# Patient Record
Sex: Female | Born: 1935 | Race: White | Hispanic: No | Smoking: Never smoker
Health system: Southern US, Community
[De-identification: ages and names within clinical notes are randomized; demographics above are authoritative.]

## PROBLEM LIST (undated history)

## (undated) DIAGNOSIS — E079 Disorder of thyroid, unspecified: Secondary | ICD-10-CM

## (undated) DIAGNOSIS — R238 Other skin changes: Secondary | ICD-10-CM

## (undated) DIAGNOSIS — Z9889 Other specified postprocedural states: Secondary | ICD-10-CM

## (undated) DIAGNOSIS — E785 Hyperlipidemia, unspecified: Secondary | ICD-10-CM

## (undated) DIAGNOSIS — C801 Malignant (primary) neoplasm, unspecified: Secondary | ICD-10-CM

## (undated) DIAGNOSIS — I10 Essential (primary) hypertension: Secondary | ICD-10-CM

## (undated) DIAGNOSIS — R112 Nausea with vomiting, unspecified: Secondary | ICD-10-CM

## (undated) DIAGNOSIS — R233 Spontaneous ecchymoses: Secondary | ICD-10-CM

## (undated) HISTORY — PX: FRACTURE SURGERY: SHX138

## (undated) HISTORY — DX: Malignant (primary) neoplasm, unspecified: C80.1

## (undated) HISTORY — DX: Disorder of thyroid, unspecified: E07.9

## (undated) HISTORY — DX: Essential (primary) hypertension: I10

## (undated) HISTORY — DX: Hyperlipidemia, unspecified: E78.5

## (undated) HISTORY — DX: Other skin changes: R23.8

## (undated) HISTORY — PX: APPENDECTOMY: SHX54

## (undated) HISTORY — DX: Spontaneous ecchymoses: R23.3

---

## 2004-02-13 ENCOUNTER — Encounter: Admission: RE | Admit: 2004-02-13 | Discharge: 2004-02-13 | Payer: Self-pay | Admitting: Surgery

## 2005-02-04 ENCOUNTER — Encounter: Admission: RE | Admit: 2005-02-04 | Discharge: 2005-02-04 | Payer: Self-pay | Admitting: Surgery

## 2006-02-07 ENCOUNTER — Encounter: Admission: RE | Admit: 2006-02-07 | Discharge: 2006-02-07 | Payer: Self-pay | Admitting: Surgery

## 2007-02-09 ENCOUNTER — Encounter: Admission: RE | Admit: 2007-02-09 | Discharge: 2007-02-09 | Payer: Self-pay | Admitting: Surgery

## 2008-02-12 ENCOUNTER — Encounter: Admission: RE | Admit: 2008-02-12 | Discharge: 2008-02-12 | Payer: Self-pay | Admitting: Surgery

## 2009-02-13 ENCOUNTER — Encounter: Admission: RE | Admit: 2009-02-13 | Discharge: 2009-02-13 | Payer: Self-pay | Admitting: Family Medicine

## 2009-02-21 ENCOUNTER — Encounter: Admission: RE | Admit: 2009-02-21 | Discharge: 2009-02-21 | Payer: Self-pay | Admitting: Family Medicine

## 2009-02-28 ENCOUNTER — Encounter: Admission: RE | Admit: 2009-02-28 | Discharge: 2009-02-28 | Payer: Self-pay | Admitting: Family Medicine

## 2009-03-07 ENCOUNTER — Encounter: Admission: RE | Admit: 2009-03-07 | Discharge: 2009-03-07 | Payer: Self-pay | Admitting: Family Medicine

## 2009-04-14 ENCOUNTER — Encounter: Admission: RE | Admit: 2009-04-14 | Discharge: 2009-04-14 | Payer: Self-pay | Admitting: Surgery

## 2009-04-16 ENCOUNTER — Encounter: Admission: RE | Admit: 2009-04-16 | Discharge: 2009-04-16 | Payer: Self-pay | Admitting: Surgery

## 2009-04-16 ENCOUNTER — Ambulatory Visit (HOSPITAL_BASED_OUTPATIENT_CLINIC_OR_DEPARTMENT_OTHER): Admission: RE | Admit: 2009-04-16 | Discharge: 2009-04-16 | Payer: Self-pay | Admitting: Surgery

## 2009-04-16 HISTORY — PX: BREAST SURGERY: SHX581

## 2009-04-22 ENCOUNTER — Ambulatory Visit: Payer: Self-pay | Admitting: Oncology

## 2009-04-28 LAB — CBC WITH DIFFERENTIAL/PLATELET
BASO%: 0.6 % (ref 0.0–2.0)
EOS%: 1.5 % (ref 0.0–7.0)
LYMPH%: 34 % (ref 14.0–49.7)
MCH: 30.1 pg (ref 25.1–34.0)
MCHC: 33.8 g/dL (ref 31.5–36.0)
MONO#: 0.5 10*3/uL (ref 0.1–0.9)
NEUT%: 56.7 % (ref 38.4–76.8)
Platelets: 218 10*3/uL (ref 145–400)
RBC: 4.63 10*6/uL (ref 3.70–5.45)
WBC: 6.7 10*3/uL (ref 3.9–10.3)
lymph#: 2.3 10*3/uL (ref 0.9–3.3)

## 2009-04-28 LAB — COMPREHENSIVE METABOLIC PANEL
ALT: 12 U/L (ref 0–35)
AST: 13 U/L (ref 0–37)
Alkaline Phosphatase: 105 U/L (ref 39–117)
CO2: 26 mEq/L (ref 19–32)
Creatinine, Ser: 0.82 mg/dL (ref 0.40–1.20)
Sodium: 140 mEq/L (ref 135–145)
Total Bilirubin: 0.4 mg/dL (ref 0.3–1.2)
Total Protein: 7.1 g/dL (ref 6.0–8.3)

## 2009-04-30 ENCOUNTER — Ambulatory Visit: Admission: RE | Admit: 2009-04-30 | Discharge: 2009-05-02 | Payer: Self-pay | Admitting: Radiation Oncology

## 2009-05-13 ENCOUNTER — Ambulatory Visit: Admission: RE | Admit: 2009-05-13 | Discharge: 2009-07-18 | Payer: Self-pay | Admitting: Radiation Oncology

## 2009-05-26 ENCOUNTER — Ambulatory Visit: Payer: Self-pay | Admitting: Oncology

## 2009-05-26 LAB — CBC WITH DIFFERENTIAL/PLATELET
BASO%: 0.5 % (ref 0.0–2.0)
Basophils Absolute: 0 10*3/uL (ref 0.0–0.1)
Eosinophils Absolute: 0.1 10*3/uL (ref 0.0–0.5)
HCT: 43.6 % (ref 34.8–46.6)
HGB: 15 g/dL (ref 11.6–15.9)
LYMPH%: 37.5 % (ref 14.0–49.7)
MONO#: 0.5 10*3/uL (ref 0.1–0.9)
NEUT%: 51.1 % (ref 38.4–76.8)
Platelets: 204 10*3/uL (ref 145–400)
WBC: 5.8 10*3/uL (ref 3.9–10.3)
lymph#: 2.2 10*3/uL (ref 0.9–3.3)

## 2009-06-30 ENCOUNTER — Ambulatory Visit: Payer: Self-pay | Admitting: Oncology

## 2009-06-30 LAB — CBC WITH DIFFERENTIAL/PLATELET
BASO%: 0.6 % (ref 0.0–2.0)
Basophils Absolute: 0 10*3/uL (ref 0.0–0.1)
EOS%: 2.5 % (ref 0.0–7.0)
HCT: 39.6 % (ref 34.8–46.6)
HGB: 13.7 g/dL (ref 11.6–15.9)
LYMPH%: 29.4 % (ref 14.0–49.7)
MCH: 30.1 pg (ref 25.1–34.0)
MCHC: 34.7 g/dL (ref 31.5–36.0)
NEUT%: 55.7 % (ref 38.4–76.8)
Platelets: 196 10*3/uL (ref 145–400)
lymph#: 1.2 10*3/uL (ref 0.9–3.3)

## 2009-06-30 LAB — BASIC METABOLIC PANEL
BUN: 13 mg/dL (ref 6–23)
CO2: 28 mEq/L (ref 19–32)
Calcium: 9.8 mg/dL (ref 8.4–10.5)
Chloride: 100 mEq/L (ref 96–112)
Creatinine, Ser: 0.71 mg/dL (ref 0.40–1.20)

## 2009-07-14 LAB — COMPREHENSIVE METABOLIC PANEL
ALT: 16 U/L (ref 0–35)
AST: 15 U/L (ref 0–37)
Alkaline Phosphatase: 79 U/L (ref 39–117)
BUN: 17 mg/dL (ref 6–23)
Calcium: 9.5 mg/dL (ref 8.4–10.5)
Creatinine, Ser: 0.74 mg/dL (ref 0.40–1.20)
Total Bilirubin: 0.7 mg/dL (ref 0.3–1.2)

## 2009-07-14 LAB — CBC WITH DIFFERENTIAL/PLATELET
BASO%: 0.6 % (ref 0.0–2.0)
Basophils Absolute: 0 10*3/uL (ref 0.0–0.1)
EOS%: 3.4 % (ref 0.0–7.0)
HCT: 38.1 % (ref 34.8–46.6)
HGB: 13.1 g/dL (ref 11.6–15.9)
LYMPH%: 22.1 % (ref 14.0–49.7)
MCH: 30 pg (ref 25.1–34.0)
MCHC: 34.3 g/dL (ref 31.5–36.0)
MCV: 87.4 fL (ref 79.5–101.0)
MONO%: 12.6 % (ref 0.0–14.0)
NEUT%: 61.3 % (ref 38.4–76.8)
Platelets: 175 10*3/uL (ref 145–400)
lymph#: 0.8 10*3/uL — ABNORMAL LOW (ref 0.9–3.3)

## 2009-08-22 ENCOUNTER — Ambulatory Visit: Payer: Self-pay | Admitting: Oncology

## 2009-08-25 LAB — CBC WITH DIFFERENTIAL/PLATELET
BASO%: 0.5 % (ref 0.0–2.0)
Basophils Absolute: 0 10*3/uL (ref 0.0–0.1)
HCT: 40.6 % (ref 34.8–46.6)
HGB: 13.9 g/dL (ref 11.6–15.9)
LYMPH%: 24.4 % (ref 14.0–49.7)
MCHC: 34.3 g/dL (ref 31.5–36.0)
MONO#: 0.5 10*3/uL (ref 0.1–0.9)
NEUT%: 62.5 % (ref 38.4–76.8)
Platelets: 182 10*3/uL (ref 145–400)
WBC: 4.5 10*3/uL (ref 3.9–10.3)

## 2010-02-16 ENCOUNTER — Encounter: Admission: RE | Admit: 2010-02-16 | Discharge: 2010-02-16 | Payer: Self-pay | Admitting: Obstetrics and Gynecology

## 2010-02-18 ENCOUNTER — Encounter: Admission: RE | Admit: 2010-02-18 | Discharge: 2010-02-18 | Payer: Self-pay | Admitting: Family Medicine

## 2010-02-24 ENCOUNTER — Encounter: Admission: RE | Admit: 2010-02-24 | Discharge: 2010-02-24 | Payer: Self-pay | Admitting: Family Medicine

## 2010-02-26 ENCOUNTER — Ambulatory Visit: Payer: Self-pay | Admitting: Oncology

## 2010-03-02 LAB — CBC WITH DIFFERENTIAL/PLATELET
BASO%: 0.5 % (ref 0.0–2.0)
Eosinophils Absolute: 0.1 10*3/uL (ref 0.0–0.5)
MONO#: 0.4 10*3/uL (ref 0.1–0.9)
NEUT#: 2.4 10*3/uL (ref 1.5–6.5)
RBC: 4.6 10*6/uL (ref 3.70–5.45)
RDW: 12.8 % (ref 11.2–14.5)
WBC: 3.9 10*3/uL (ref 3.9–10.3)

## 2010-03-02 LAB — BASIC METABOLIC PANEL
CO2: 29 mEq/L (ref 19–32)
Glucose, Bld: 84 mg/dL (ref 70–99)
Potassium: 3.8 mEq/L (ref 3.5–5.3)
Sodium: 140 mEq/L (ref 135–145)

## 2010-03-03 HISTORY — PX: MASTECTOMY: SHX3

## 2010-03-17 ENCOUNTER — Ambulatory Visit (HOSPITAL_COMMUNITY): Admission: RE | Admit: 2010-03-17 | Discharge: 2010-03-18 | Payer: Self-pay | Admitting: Surgery

## 2010-03-17 ENCOUNTER — Encounter (INDEPENDENT_AMBULATORY_CARE_PROVIDER_SITE_OTHER): Payer: Self-pay | Admitting: Surgery

## 2010-05-11 ENCOUNTER — Ambulatory Visit: Payer: Self-pay | Admitting: Oncology

## 2010-05-13 LAB — CBC WITH DIFFERENTIAL/PLATELET
BASO%: 0.4 % (ref 0.0–2.0)
Basophils Absolute: 0 10*3/uL (ref 0.0–0.1)
EOS%: 2.1 % (ref 0.0–7.0)
Eosinophils Absolute: 0.1 10*3/uL (ref 0.0–0.5)
HCT: 42.7 % (ref 34.8–46.6)
HGB: 14.5 g/dL (ref 11.6–15.9)
LYMPH%: 31.4 % (ref 14.0–49.7)
MCH: 29.9 pg (ref 25.1–34.0)
MCHC: 33.9 g/dL (ref 31.5–36.0)
MCV: 88.2 fL (ref 79.5–101.0)
MONO#: 0.5 10*3/uL (ref 0.1–0.9)
MONO%: 10.5 % (ref 0.0–14.0)
NEUT#: 2.6 10*3/uL (ref 1.5–6.5)
NEUT%: 55.6 % (ref 38.4–76.8)
Platelets: 225 10*3/uL (ref 145–400)
RBC: 4.84 10*6/uL (ref 3.70–5.45)
RDW: 13.4 % (ref 11.2–14.5)
WBC: 4.7 10*3/uL (ref 3.9–10.3)
lymph#: 1.5 10*3/uL (ref 0.9–3.3)

## 2010-05-13 LAB — BASIC METABOLIC PANEL
BUN: 13 mg/dL (ref 6–23)
CO2: 31 mEq/L (ref 19–32)
Calcium: 10.2 mg/dL (ref 8.4–10.5)
Chloride: 100 mEq/L (ref 96–112)
Creatinine, Ser: 0.77 mg/dL (ref 0.40–1.20)
Glucose, Bld: 80 mg/dL (ref 70–99)
Potassium: 4.2 mEq/L (ref 3.5–5.3)
Sodium: 139 mEq/L (ref 135–145)

## 2010-05-24 ENCOUNTER — Encounter: Payer: Self-pay | Admitting: Family Medicine

## 2010-07-14 LAB — DIFFERENTIAL
Eosinophils Relative: 3 % (ref 0–5)
Lymphocytes Relative: 30 % (ref 12–46)
Lymphs Abs: 1.1 10*3/uL (ref 0.7–4.0)
Monocytes Absolute: 0.4 10*3/uL (ref 0.1–1.0)
Monocytes Relative: 9 % (ref 3–12)

## 2010-07-14 LAB — CBC
MCH: 28.4 pg (ref 26.0–34.0)
MCHC: 33.3 g/dL (ref 30.0–36.0)
Platelets: 193 10*3/uL (ref 150–400)
RBC: 4.57 MIL/uL (ref 3.87–5.11)

## 2010-07-14 LAB — COMPREHENSIVE METABOLIC PANEL
ALT: 14 U/L (ref 0–35)
AST: 16 U/L (ref 0–37)
Albumin: 3.8 g/dL (ref 3.5–5.2)
Calcium: 9.6 mg/dL (ref 8.4–10.5)
Creatinine, Ser: 0.7 mg/dL (ref 0.4–1.2)
GFR calc Af Amer: >60 mL/min (ref >60)
Sodium: 138 mEq/L (ref 135–145)
Total Protein: 7.1 g/dL (ref 6.0–8.3)

## 2010-07-14 LAB — SURGICAL PCR SCREEN: Staphylococcus aureus: NEGATIVE

## 2010-08-04 LAB — CBC
HCT: 40.5 % (ref 36.0–46.0)
Hemoglobin: 13.9 g/dL (ref 12.0–15.0)
MCV: 88.6 fL (ref 78.0–100.0)
RBC: 4.57 MIL/uL (ref 3.87–5.11)
WBC: 4.7 10*3/uL (ref 4.0–10.5)

## 2010-08-04 LAB — COMPREHENSIVE METABOLIC PANEL
AST: 16 U/L (ref 0–37)
CO2: 30 mEq/L (ref 19–32)
Chloride: 103 mEq/L (ref 96–112)
Creatinine, Ser: 0.72 mg/dL (ref 0.4–1.2)
GFR calc Af Amer: >60 mL/min (ref >60)
GFR calc non Af Amer: >60 mL/min (ref >60)
Glucose, Bld: 86 mg/dL (ref 70–99)
Total Bilirubin: 0.8 mg/dL (ref 0.3–1.2)

## 2010-08-04 LAB — DIFFERENTIAL
Basophils Relative: 0 % (ref 0–1)
Eosinophils Absolute: 0.1 10*3/uL (ref 0.0–0.7)
Eosinophils Relative: 2 % (ref 0–5)
Neutrophils Relative %: 48 % (ref 43–77)

## 2010-08-04 LAB — LACTATE DEHYDROGENASE: LDH: 147 U/L (ref 94–250)

## 2010-09-30 ENCOUNTER — Encounter (INDEPENDENT_AMBULATORY_CARE_PROVIDER_SITE_OTHER): Payer: Self-pay | Admitting: Surgery

## 2011-01-18 ENCOUNTER — Other Ambulatory Visit (INDEPENDENT_AMBULATORY_CARE_PROVIDER_SITE_OTHER): Payer: Self-pay | Admitting: Surgery

## 2011-01-18 DIAGNOSIS — Z853 Personal history of malignant neoplasm of breast: Secondary | ICD-10-CM

## 2011-02-23 ENCOUNTER — Other Ambulatory Visit (INDEPENDENT_AMBULATORY_CARE_PROVIDER_SITE_OTHER): Payer: Self-pay | Admitting: Surgery

## 2011-02-23 ENCOUNTER — Ambulatory Visit
Admission: RE | Admit: 2011-02-23 | Discharge: 2011-02-23 | Disposition: A | Discharge status: Home / Self Care | Payer: Medicare Other | Source: Ambulatory Visit | Attending: Surgery | Admitting: Surgery

## 2011-02-23 DIAGNOSIS — Z853 Personal history of malignant neoplasm of breast: Secondary | ICD-10-CM

## 2011-02-24 ENCOUNTER — Ambulatory Visit
Admission: RE | Admit: 2011-02-24 | Discharge: 2011-02-24 | Disposition: A | Discharge status: Home / Self Care | Payer: Medicare Other | Source: Ambulatory Visit | Attending: Surgery | Admitting: Surgery

## 2011-02-24 ENCOUNTER — Other Ambulatory Visit (INDEPENDENT_AMBULATORY_CARE_PROVIDER_SITE_OTHER): Payer: Self-pay | Admitting: Surgery

## 2011-02-24 DIAGNOSIS — Z853 Personal history of malignant neoplasm of breast: Secondary | ICD-10-CM

## 2011-02-24 DIAGNOSIS — C50912 Malignant neoplasm of unspecified site of left female breast: Secondary | ICD-10-CM

## 2011-03-01 ENCOUNTER — Other Ambulatory Visit: Payer: Medicare Other

## 2011-03-03 ENCOUNTER — Encounter (INDEPENDENT_AMBULATORY_CARE_PROVIDER_SITE_OTHER): Payer: Self-pay

## 2011-03-04 ENCOUNTER — Ambulatory Visit (INDEPENDENT_AMBULATORY_CARE_PROVIDER_SITE_OTHER): Payer: Medicare Other | Admitting: Surgery

## 2011-03-04 ENCOUNTER — Encounter (INDEPENDENT_AMBULATORY_CARE_PROVIDER_SITE_OTHER): Payer: Self-pay | Admitting: Surgery

## 2011-03-04 DIAGNOSIS — C50919 Malignant neoplasm of unspecified site of unspecified female breast: Secondary | ICD-10-CM | POA: Insufficient documentation

## 2011-03-04 DIAGNOSIS — C50911 Malignant neoplasm of unspecified site of right female breast: Secondary | ICD-10-CM | POA: Insufficient documentation

## 2011-03-04 NOTE — Progress Notes (Addendum)
ASSESSMENT AND PLAN: 1.  Left breast cancer, IDC.  ER-99%/PR-100%, Ki67 - 13%, Her2Neu - neg.  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  Dr. Welton Flakes and [facility] treating physicians.  I discussed the options for breast cancer treatment with the patient.  She has been through this before and understands the plan and surgery.  I discussed reconstruction which she does not want to do.  Since she has had a prior lumpectomy and radiation, she needs a mastectomy this time.  The risks of surgery include, but are not limited to, bleeding, infection, the need for further surgery, and nerve injury.  The patient has been given literature on the treatment of breast cancer.  She is accompanied with her husband.  2.  Right breast cancer, DCIS.  Mastectomy 03/17/2010.  Disease free.  3.  Hypertension. 4.  Hypercholesterolemia. 5.  Thyroid replacement. 6.  She tried anti-estrogen tx before, but did not tolerate it.  Chief Complaint  Patient presents with  . Other    Eval of left breast cancer   REFERRING PHYSICIAN: LITTLE,[name], MD  HISTORY OF PRESENT ILLNESS: Claudia Stein is a 77 y.o. (DOB: July 28, 1933)  whtie female whose primary care physician is LITTLE,[name], MD and comes to me today for left breast cancer.  The patient is well known to me for bilateral breast cancer, DCIS. She had a recent mammogram that showed changes in her left breast. She had retroareolar calcifications which were suspicious. A biopsy on 23 February 2011 showed invasive ductal carcinoma.  The patient herself had noticed a change in her breast or had any problems with her breasts.  She is accompanied with her husband today in our office.   Had left breast lumpectomy for DCIS 04/11/2009.  Had right mastectomy for DCIS 03/17/2010.  Presented to Breast Ca Conf 03/03/2011.  Now has IDC with LVI of left breast  ER/PR pos., Ki67 - 13%, Her2Neu - neg.   Will need a left mastectomy    Past Medical History  Diagnosis Date  .  Cancer 04-16-09, 2012    Breast  . Hyperlipidemia   . Hypertension   . Thyroid disease   . Bruises easily     Past Surgical History  Procedure Date  . Breast surgery 04-16-09    left  Lumpectomy  . Mastectomy November 2011    right breast    Current Outpatient Prescriptions  Medication Sig Dispense Refill  . levothyroxine (SYNTHROID, LEVOTHROID) 50 MCG tablet Take 50 mcg by mouth daily.        . NON FORMULARY 2 (two) times daily. Takes Preservision for eyes       . Omega-3 Fatty Acids (FISH OIL) 1000 MG CAPS Take by mouth daily.        . Calcium Carbonate-Vitamin D (CALCIUM PLUS VITAMIN D PO) Take 1 tablet by mouth daily.        . hydrochlorothiazide 25 MG tablet Take 25 mg by mouth daily.        Marland Kitchen losartan (COZAAR) 50 MG tablet Take 25 mg by mouth daily.        . Multiple Vitamins-Minerals (PRESERVISION AREDS 2 PO) Take 2 tablets by mouth daily.        . simvastatin (ZOCOR) 20 MG tablet Take 20 mg by mouth daily.        . vitamin B-12 (CYANOCOBALAMIN) 1000 MCG tablet Take 1,000 mcg by mouth daily.        . vitamin C (ASCORBIC ACID) 500 MG tablet Take 500 mg  by mouth daily.          No Known Allergies  REVIEW OF SYSTEMS: Skin:  No history of rash.  No history of abnormal moles. Infection:  No history of hepatitis or HIV.  No history of MRSA. Neurologic:  No history of stroke.  No history of seizure.  No history of headaches. Cardiac:  Hypertension x 6 years. No history of heart disease.  No history of prior cardiac catheterization.  No history of seeing a cardiologist. Pulmonary:  Does not smoke cigarettes.  No asthma or bronchitis.  No OSA/CPAP.  Endocrine:  No diabetes.  On thyroid replacement.  Hypercholesterolemia. Gastrointestinal:  No history of stomach disease.  No history of liver disease.  No history of gall bladder disease.  No history of pancreas disease.  No history of colon disease. Trouble with nausea with last anesthesia. Urologic:  No history of kidney stones.   No history of bladder infections. Musculoskeletal:  No history of joint or back disease. Hematologic:  No bleeding disorder.  No history of anemia.  Not anticoagulated. Psycho-social:  The patient is oriented.   The patient has no obvious psychologic or social impairment to understanding our conversation and plan.  SOCIAL and FAMILY HISTORY: Accompanied by husband.  PHYSICAL EXAM: BP 142/90  Pulse 78  Temp(Src) 97.1 F (36.2 C) (Temporal)  Resp 16  Ht 5\' 2"  (1.575 m)  Wt 125 lb (56.7 kg)  BMI 22.86 kg/m2  General: WN WF, who is alert and generally healthy appearing.  HEENT: Normal. Pupils equal. Good dentition. Neck: Supple. No mass.  No thyroid mass.  Carotid pulse okay with no bruit. Lymph Nodes:  No supraclavicular, cervical nodes, or axillary mass. Lungs: Clear to auscultation and symmetric breath sounds. Heart:  RRR. No murmur or rub. Breasts:  Absent right breast.  Left breast with deformity below nipple from prior surgery.  No axillary mass. Abdomen: Soft. No mass. No tenderness. No hernia. Normal bowel sounds.  No abdominal scars. Rectal: Not done. Extremities:  Good strength and ROM  in upper and lower extremities. Neurologic:  Grossly intact to motor and sensory function. Psychiatric: Has normal mood and affect. Behavior is normal.   DATA REVIEWED: Mammogram, path report (to patient).  Ovidio Kin, M.D., [facility], [place] [phone]  UPDATE  Date of surgery:  ________________________________   Planned surgery: _________________________________________________________  The H & P was reviewed, the patient examined, and no change has occurred in the patient's condition or plan of care since this H & P was completed.  ____________________________        Date:  _________________   Time:  _________ Ovidio Kin,  MD, FACS

## 2011-03-05 ENCOUNTER — Other Ambulatory Visit (INDEPENDENT_AMBULATORY_CARE_PROVIDER_SITE_OTHER): Payer: Self-pay | Admitting: Surgery

## 2011-03-05 DIAGNOSIS — C50912 Malignant neoplasm of unspecified site of left female breast: Secondary | ICD-10-CM

## 2011-03-07 MED ORDER — DEXTROSE 5 % IV SOLN: 1.0000 g | Freq: Once | INTRAVENOUS | Status: DC

## 2011-03-12 ENCOUNTER — Encounter (HOSPITAL_COMMUNITY): Payer: Self-pay | Admitting: Pharmacy Technician

## 2011-03-16 ENCOUNTER — Other Ambulatory Visit: Payer: Self-pay

## 2011-03-16 ENCOUNTER — Encounter (HOSPITAL_COMMUNITY)
Admission: RE | Admit: 2011-03-16 | Discharge: 2011-03-16 | Disposition: A | Discharge status: Home / Self Care | Payer: Medicare Other | Source: Ambulatory Visit | Attending: Surgery | Admitting: Surgery

## 2011-03-16 ENCOUNTER — Encounter (HOSPITAL_COMMUNITY): Payer: Self-pay

## 2011-03-16 HISTORY — DX: Other specified postprocedural states: Z98.890

## 2011-03-16 HISTORY — DX: Other specified postprocedural states: R11.2

## 2011-03-16 LAB — CBC
Hemoglobin: 14.6 g/dL (ref 12.0–15.0)
MCH: 29.5 pg (ref 26.0–34.0)
MCHC: 33.9 g/dL (ref 30.0–36.0)
Platelets: 198 10*3/uL (ref 150–400)
RDW: 12.8 % (ref 11.5–15.5)

## 2011-03-16 LAB — COMPREHENSIVE METABOLIC PANEL
ALT: 14 U/L (ref 0–35)
AST: 17 U/L (ref 0–37)
Albumin: 3.7 g/dL (ref 3.5–5.2)
Alkaline Phosphatase: 120 U/L — ABNORMAL HIGH (ref 39–117)
Calcium: 10.7 mg/dL — ABNORMAL HIGH (ref 8.4–10.5)
GFR calc Af Amer: >90 mL/min (ref >90)
Glucose, Bld: 84 mg/dL (ref 70–99)
Potassium: 3.9 mEq/L (ref 3.5–5.1)
Sodium: 140 mEq/L (ref 135–145)
Total Protein: 7.8 g/dL (ref 6.0–8.3)

## 2011-03-16 LAB — DIFFERENTIAL
Eosinophils Absolute: 0.1 10*3/uL (ref 0.0–0.7)
Eosinophils Relative: 1 % (ref 0–5)
Lymphocytes Relative: 38 % (ref 12–46)
Lymphs Abs: 1.7 10*3/uL (ref 0.7–4.0)
Monocytes Relative: 11 % (ref 3–12)

## 2011-03-16 LAB — SURGICAL PCR SCREEN: MRSA, PCR: NEGATIVE

## 2011-03-16 NOTE — Pre-Procedure Instructions (Signed)
20 Claudia Stein  03/16/2011   Your procedure is scheduled on: Monday, November 19th  Report to [facility] at 8:30AM.  Call this number if you have problems the morning of surgery: [phone]   Remember:   Do not eat food:After Midnight.  Do not drink clear liquids: 4 Hours before arrival.  Take these medicines the morning of surgery with A SIP OF WATER: Losartan &              synthroid  Do not wear jewelry, make-up or nail polish.  Do not wear lotions, powders, or perfumes. You may wear deodorant.  Do not shave 48 hours prior to surgery.  Do not bring valuables to the hospital.  Contacts, dentures or bridgework may not be worn into surgery.  Leave suitcase in the car. After surgery it may be brought to your room.  For patients admitted to the hospital, checkout time is 11:00 AM the day of discharge.   Patients discharged the day of surgery will not be allowed to drive home.  Name and phone number of your driver: Leonette Most --spouse                                                     Special Instructions: CHG Shower Use Special Wash: 1/2 bottle night before surgery and 1/2 bottle morning of surgery.   Please read over the following fact sheets that you were given: Pain Booklet, MRSA Information and Surgical Site Infection Prevention

## 2011-03-16 NOTE — Pre-Procedure Instructions (Signed)
Claudia Stein  03/16/2011   Your procedure is scheduled on: Monday, November 19th  Report to [facility] at 8:30AM.  Call this number if you have problems the morning of surgery: [phone]   Remember:   Do not eat food:After Midnight .  Do not drink clear liquids: 4 Hours before arrival 4:30 am.  Take these medicines the morning of surgery with A SIP OF WATER: Losartan & Synthroid   Do not wear jewelry, make-up or nail polish.   Do not wear lotions, powders, or perfumes. You may wear deodorant.  Do not shave 48 hours prior to surgery.   Do not bring valuables to the hospital.   Contacts, dentures or bridgework may not be worn into surgery.  Leave suitcase in the car. After surgery it may be brought to your room.  For patients admitted to the hospital, checkout time is 11:00 AM the day of discharge.   Patients discharged the day of surgery will not be allowed to drive home.  Name and phone number of your driver:  Yashica Sterbenz  -- spouse [phone]    Special Instructions: CHG Shower Use Special Wash: 1/2 bottle night before surgery and 1/2 bottle morning of surgery.   Please read over the following fact sheets that you were given:  Pain Booklet, MRSA Information and Surgical Site Infection Prevention

## 2011-03-17 ENCOUNTER — Ambulatory Visit (INDEPENDENT_AMBULATORY_CARE_PROVIDER_SITE_OTHER): Payer: Self-pay | Admitting: Surgery

## 2011-03-18 NOTE — Consult Note (Signed)
Anesthesia:  77 year old female for left mastectomy for breast CA.  She underwent a right mastectomy in November 2011.  Hx + for hyperlipidemia, HTN, hypothyroidism.  I was asked to review her preoperative EKG which showed left BBB.  I actually reviewed her chart and a prior EKG also showing a left BBB in November.  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  She has tolerated at least two prior breast procedures.  If no new symptoms, then plan to proceed.  Labs and CXR also reviewed.

## 2011-03-18 NOTE — Progress Notes (Signed)
ALLISON PLEASE REVIEW EKG.

## 2011-03-20 NOTE — Progress Notes (Signed)
Quick Note:  These labs are OK for surgery. ______ 

## 2011-03-21 MED ORDER — CEFAZOLIN SODIUM 1-5 GM-% IV SOLN
1.0000 g | INTRAVENOUS | Status: AC
  Administered 2011-03-22: 1 g via INTRAVENOUS
  Filled 2011-03-21: qty 50

## 2011-03-22 ENCOUNTER — Other Ambulatory Visit (INDEPENDENT_AMBULATORY_CARE_PROVIDER_SITE_OTHER): Payer: Self-pay | Admitting: Surgery

## 2011-03-22 ENCOUNTER — Encounter (HOSPITAL_COMMUNITY)
Admission: AD | Disposition: A | Discharge status: Home / Self Care | Payer: Self-pay | Source: Ambulatory Visit | Attending: Surgery

## 2011-03-22 ENCOUNTER — Encounter (HOSPITAL_COMMUNITY): Payer: Self-pay | Admitting: Vascular Surgery

## 2011-03-22 ENCOUNTER — Encounter (HOSPITAL_COMMUNITY): Payer: Self-pay | Admitting: *Deleted

## 2011-03-22 ENCOUNTER — Ambulatory Visit (HOSPITAL_COMMUNITY): Payer: Medicare Other | Admitting: Vascular Surgery

## 2011-03-22 ENCOUNTER — Inpatient Hospital Stay (HOSPITAL_COMMUNITY)
Admission: AD | Admit: 2011-03-22 | Discharge: 2011-03-23 | DRG: 581 | Disposition: A | Discharge status: Home / Self Care | Payer: Medicare Other | Source: Ambulatory Visit | Attending: Surgery | Admitting: Surgery

## 2011-03-22 ENCOUNTER — Ambulatory Visit (HOSPITAL_COMMUNITY): Payer: Medicare Other

## 2011-03-22 ENCOUNTER — Ambulatory Visit (HOSPITAL_COMMUNITY)
Admission: RE | Admit: 2011-03-22 | Discharge: 2011-03-22 | Disposition: A | Discharge status: Home / Self Care | Payer: Medicare Other | Source: Ambulatory Visit | Attending: Surgery | Admitting: Surgery

## 2011-03-22 DIAGNOSIS — C50919 Malignant neoplasm of unspecified site of unspecified female breast: Secondary | ICD-10-CM

## 2011-03-22 DIAGNOSIS — Z01812 Encounter for preprocedural laboratory examination: Secondary | ICD-10-CM

## 2011-03-22 DIAGNOSIS — I10 Essential (primary) hypertension: Secondary | ICD-10-CM | POA: Diagnosis present

## 2011-03-22 DIAGNOSIS — C50912 Malignant neoplasm of unspecified site of left female breast: Secondary | ICD-10-CM

## 2011-03-22 DIAGNOSIS — E7800 Pure hypercholesterolemia, unspecified: Secondary | ICD-10-CM | POA: Diagnosis present

## 2011-03-22 DIAGNOSIS — Z853 Personal history of malignant neoplasm of breast: Secondary | ICD-10-CM

## 2011-03-22 HISTORY — PX: MASTECTOMY W/ SENTINEL NODE BIOPSY: SHX2001

## 2011-03-22 SURGERY — MASTECTOMY WITH SENTINEL LYMPH NODE BIOPSY
Anesthesia: General | Laterality: Left | Wound class: Clean

## 2011-03-22 MED ORDER — FENTANYL CITRATE 0.05 MG/ML IJ SOLN
INTRAMUSCULAR | Status: DC | PRN
  Administered 2011-03-22: 25 ug via INTRAVENOUS
  Administered 2011-03-22: 50 ug via INTRAVENOUS
  Administered 2011-03-22: 100 ug via INTRAVENOUS

## 2011-03-22 MED ORDER — FENTANYL CITRATE 0.05 MG/ML IJ SOLN
50.0000 ug | INTRAMUSCULAR | Status: DC | PRN
  Administered 2011-03-22: 100 ug via INTRAVENOUS

## 2011-03-22 MED ORDER — TECHNETIUM TC 99M SULFUR COLLOID FILTERED
1.0000 | Freq: Once | INTRAVENOUS | Status: AC | PRN
  Administered 2011-03-22: 1 via INTRADERMAL

## 2011-03-22 MED ORDER — LEVOTHYROXINE SODIUM 50 MCG PO TABS
50.0000 ug | ORAL_TABLET | Freq: Every day | ORAL | Status: DC
  Administered 2011-03-23: 50 ug via ORAL
  Filled 2011-03-22 (×2): qty 1

## 2011-03-22 MED ORDER — LACTATED RINGERS IV SOLN
INTRAVENOUS | Status: DC
  Administered 2011-03-22: 10:00:00 via INTRAVENOUS

## 2011-03-22 MED ORDER — ONDANSETRON HCL 4 MG PO TABS: 4.0000 mg | ORAL_TABLET | Freq: Four times a day (QID) | ORAL | Status: DC | PRN

## 2011-03-22 MED ORDER — MORPHINE SULFATE 2 MG/ML IJ SOLN: 1.0000 mg | INTRAMUSCULAR | Status: DC | PRN

## 2011-03-22 MED ORDER — PROPOFOL 10 MG/ML IV EMUL
INTRAVENOUS | Status: DC | PRN
  Administered 2011-03-22: 150 mg via INTRAVENOUS

## 2011-03-22 MED ORDER — KCL IN DEXTROSE-NACL 20-5-0.45 MEQ/L-%-% IV SOLN
INTRAVENOUS | Status: DC
  Administered 2011-03-22 – 2011-03-23 (×2): via INTRAVENOUS
  Filled 2011-03-22 (×3): qty 1000

## 2011-03-22 MED ORDER — TECHNETIUM TC 99M SULFUR COLLOID FILTERED: 1.0000 | Freq: Once | INTRAVENOUS | Status: AC | PRN

## 2011-03-22 MED ORDER — HYDROCHLOROTHIAZIDE 25 MG PO TABS
25.0000 mg | ORAL_TABLET | Freq: Every day | ORAL | Status: DC
  Administered 2011-03-23: 25 mg via ORAL
  Filled 2011-03-22 (×2): qty 1

## 2011-03-22 MED ORDER — FENTANYL CITRATE 0.05 MG/ML IJ SOLN
INTRAMUSCULAR | Status: AC
  Filled 2011-03-22: qty 2

## 2011-03-22 MED ORDER — GLYCOPYRROLATE 0.2 MG/ML IJ SOLN
INTRAMUSCULAR | Status: DC | PRN
  Administered 2011-03-22: 0.2 mg via INTRAVENOUS

## 2011-03-22 MED ORDER — ONDANSETRON HCL 4 MG/2ML IJ SOLN
INTRAMUSCULAR | Status: DC | PRN
  Administered 2011-03-22: 4 mg via INTRAVENOUS

## 2011-03-22 MED ORDER — MIDAZOLAM HCL 2 MG/2ML IJ SOLN
INTRAMUSCULAR | Status: AC
  Filled 2011-03-22: qty 2

## 2011-03-22 MED ORDER — LOSARTAN POTASSIUM 25 MG PO TABS
25.0000 mg | ORAL_TABLET | Freq: Every day | ORAL | Status: DC
  Filled 2011-03-22 (×2): qty 1

## 2011-03-22 MED ORDER — ONDANSETRON HCL 4 MG/2ML IJ SOLN: 4.0000 mg | Freq: Four times a day (QID) | INTRAMUSCULAR | Status: DC | PRN

## 2011-03-22 MED ORDER — SODIUM CHLORIDE 0.9 % IJ SOLN
INTRAMUSCULAR | Status: DC | PRN
  Administered 2011-03-22: 11:00:00

## 2011-03-22 MED ORDER — HEPARIN SODIUM (PORCINE) 5000 UNIT/ML IJ SOLN
5000.0000 [IU] | Freq: Three times a day (TID) | INTRAMUSCULAR | Status: DC
  Administered 2011-03-22 – 2011-03-23 (×2): 5000 [IU] via SUBCUTANEOUS
  Filled 2011-03-22 (×5): qty 1

## 2011-03-22 MED ORDER — HYDROMORPHONE HCL PF 1 MG/ML IJ SOLN
0.2500 mg | INTRAMUSCULAR | Status: DC | PRN
Start: 2011-03-22 — End: 2011-03-22
  Administered 2011-03-22: 0.25 mg via INTRAVENOUS

## 2011-03-22 MED ORDER — LACTATED RINGERS IV SOLN
INTRAVENOUS | Status: DC | PRN
  Administered 2011-03-22 (×2): via INTRAVENOUS

## 2011-03-22 MED ORDER — EPHEDRINE SULFATE 50 MG/ML IJ SOLN
INTRAMUSCULAR | Status: DC | PRN
  Administered 2011-03-22: 10 mg via INTRAVENOUS

## 2011-03-22 MED ORDER — SCOPOLAMINE 1 MG/3DAYS TD PT72
1.0000 | MEDICATED_PATCH | TRANSDERMAL | Status: DC
  Administered 2011-03-22: 1.5 mg via TRANSDERMAL
  Filled 2011-03-22: qty 1

## 2011-03-22 MED ORDER — HYDROCODONE-ACETAMINOPHEN 5-325 MG PO TABS
1.0000 | ORAL_TABLET | ORAL | Status: DC | PRN
Start: 2011-03-22 — End: 2011-03-23

## 2011-03-22 MED ORDER — SODIUM CHLORIDE 0.9 % IR SOLN
Status: DC | PRN
  Administered 2011-03-22: 1000 mL

## 2011-03-22 MED ORDER — MIDAZOLAM HCL 2 MG/2ML IJ SOLN
1.0000 mg | INTRAMUSCULAR | Status: DC | PRN
  Administered 2011-03-22: 1 mg via INTRAVENOUS

## 2011-03-22 SURGICAL SUPPLY — 52 items
APPLIER CLIP 9.375 MED OPEN (MISCELLANEOUS) ×2
ATCH SMKEVC FLXB CAUT HNDSWH (FILTER) ×1 IMPLANT
BANDAGE ELASTIC 6 VELCRO ST LF (GAUZE/BANDAGES/DRESSINGS) IMPLANT
BINDER BREAST LRG (GAUZE/BANDAGES/DRESSINGS) ×2 IMPLANT
BINDER BREAST MEDIUM (GAUZE/BANDAGES/DRESSINGS) ×2 IMPLANT
BINDER BREAST XLRG (GAUZE/BANDAGES/DRESSINGS) IMPLANT
CANISTER SUCTION 2500CC (MISCELLANEOUS) ×2 IMPLANT
CHLORAPREP W/TINT 26ML (MISCELLANEOUS) ×2 IMPLANT
CLIP APPLIE 9.375 MED OPEN (MISCELLANEOUS) ×1 IMPLANT
CLOTH BEACON ORANGE TIMEOUT ST (SAFETY) ×2 IMPLANT
CONT SPEC 4OZ CLIKSEAL STRL BL (MISCELLANEOUS) ×4 IMPLANT
COVER PROBE W GEL 5X96 (DRAPES) ×4 IMPLANT
COVER SURGICAL LIGHT HANDLE (MISCELLANEOUS) ×2 IMPLANT
DERMABOND ADVANCED (GAUZE/BANDAGES/DRESSINGS) ×1
DERMABOND ADVANCED .7 DNX12 (GAUZE/BANDAGES/DRESSINGS) ×1 IMPLANT
DRAIN CHANNEL 19F RND (DRAIN) ×4 IMPLANT
DRAPE LAPAROSCOPIC ABDOMINAL (DRAPES) ×2 IMPLANT
DRAPE PROXIMA HALF (DRAPES) ×2 IMPLANT
ELECT CAUTERY BLADE 6.4 (BLADE) ×2 IMPLANT
ELECT REM PT RETURN 9FT ADLT (ELECTROSURGICAL) ×2
ELECTRODE REM PT RTRN 9FT ADLT (ELECTROSURGICAL) ×1 IMPLANT
EVACUATOR SILICONE 100CC (DRAIN) ×4 IMPLANT
EVACUATOR SMOKE ACCUVAC VALLEY (FILTER) ×1
GAUZE SPONGE 4X4 12PLY STRL LF (GAUZE/BANDAGES/DRESSINGS) ×2 IMPLANT
GLOVE BIOGEL PI IND STRL 7.5 (GLOVE) ×1 IMPLANT
GLOVE BIOGEL PI INDICATOR 7.5 (GLOVE) ×1
GLOVE ECLIPSE 7.5 STRL STRAW (GLOVE) ×2 IMPLANT
GLOVE SURG SIGNA 7.5 PF LTX (GLOVE) ×2 IMPLANT
GOWN PREVENTION PLUS XLARGE (GOWN DISPOSABLE) ×2 IMPLANT
GOWN STRL NON-REIN LRG LVL3 (GOWN DISPOSABLE) IMPLANT
GOWN STRL REIN XL XLG (GOWN DISPOSABLE) ×2 IMPLANT
KIT BASIN OR (CUSTOM PROCEDURE TRAY) ×2 IMPLANT
KIT ROOM TURNOVER OR (KITS) ×2 IMPLANT
NEEDLE 18GX1X1/2 (RX/OR ONLY) (NEEDLE) ×2 IMPLANT
NEEDLE HYPO 25GX1X1/2 BEV (NEEDLE) ×2 IMPLANT
NS IRRIG 1000ML POUR BTL (IV SOLUTION) ×2 IMPLANT
PACK GENERAL/GYN (CUSTOM PROCEDURE TRAY) ×2 IMPLANT
PAD ARMBOARD 7.5X6 YLW CONV (MISCELLANEOUS) ×2 IMPLANT
PEN SKIN MARKING BROAD (MISCELLANEOUS) ×2 IMPLANT
SPECIMEN JAR LARGE (MISCELLANEOUS) IMPLANT
SPECIMEN JAR X LARGE (MISCELLANEOUS) ×2 IMPLANT
SPONGE GAUZE 4X4 12PLY (GAUZE/BANDAGES/DRESSINGS) ×2 IMPLANT
STAPLER VISISTAT 35W (STAPLE) ×2 IMPLANT
SUT ETHILON 2 0 FS 18 (SUTURE) ×4 IMPLANT
SUT MNCRL AB 4-0 PS2 18 (SUTURE) ×2 IMPLANT
SUT SILK 2 0 FS (SUTURE) ×2 IMPLANT
SUT VIC AB 3-0 SH 18 (SUTURE) ×4 IMPLANT
SYR CONTROL 10ML LL (SYRINGE) ×2 IMPLANT
TOWEL OR 17X24 6PK STRL BLUE (TOWEL DISPOSABLE) ×2 IMPLANT
TOWEL OR 17X26 10 PK STRL BLUE (TOWEL DISPOSABLE) ×2 IMPLANT
TUBE CONNECTING 12X1/4 (SUCTIONS) ×4 IMPLANT
WATER STERILE IRR 1000ML POUR (IV SOLUTION) IMPLANT

## 2011-03-22 NOTE — Anesthesia Preprocedure Evaluation (Signed)
Anesthesia Evaluation  Patient identified by MRN, date of birth, ID band Patient awake    Reviewed: Allergy & Precautions, H&P , NPO status , Patient's Chart, lab work & pertinent test results  History of Anesthesia Complications (+) PONV  Airway Mallampati: II  Neck ROM: full    Dental   Pulmonary          Cardiovascular hypertension,     Neuro/Psych    GI/Hepatic   Endo/Other    Renal/GU      Musculoskeletal   Abdominal   Peds  Hematology   Anesthesia Other Findings   Reproductive/Obstetrics                           Anesthesia Physical Anesthesia Plan  ASA: II  Anesthesia Plan: General   Post-op Pain Management:    Induction: Intravenous  Airway Management Planned: LMA  Additional Equipment:   Intra-op Plan:   Post-operative Plan:   Informed Consent: I have reviewed the patients History and Physical, chart, labs and discussed the procedure including the risks, benefits and alternatives for the proposed anesthesia with the patient or authorized representative who has indicated his/her understanding and acceptance.     Plan Discussed with: CRNA and Surgeon  Anesthesia Plan Comments:         Anesthesia Quick Evaluation

## 2011-03-22 NOTE — Brief Op Note (Signed)
03/22/2011  12:41 PM  PATIENT:  Claudia Stein, 77 y.o., female, MRN: [id number]  PREOP DIAGNOSIS:  Left Breast Cancer  POSTOP DIAGNOSIS:   Left breast cancer, IDC.  T1, N0.  PROCEDURE:   Procedure(s): MASTECTOMY WITH SENTINEL LYMPH NODE BIOPSY  SURGEON:   Ovidio Kin, M.D.  ASSISTANT:   None   ANESTHESIA:   general  EBL:  100  ml  Total I/O In: 1000 [I.V.:1000] Out: -   BLOOD ADMINISTERED: none  DRAINS: 2 #19 Blake drains  LOCAL MEDICATIONS USED:   none  SPECIMEN:   Breast and left ax SLNBx  COUNTS CORRECT:  YES  INDICATIONS FOR PROCEDURE:  Claudia Stein is a 77 y.o. (DOB: 17-Apr-1934) whtie female whose primary care physician is LITTLE,[name], MD and comes for left mastecotmy and L axillary SLNBx.   The indications and risks of the surgery were explained to the patient.  The risks include, but are not limited to, infection, bleeding, and nerve injury.  Note dictated to:   [phone]

## 2011-03-22 NOTE — Interval H&P Note (Signed)
History and Physical Interval Note:   03/22/2011   10:11 AM   Claudia Stein  has presented today for surgery, with the diagnosis of Left Breast cancer  The various methods of treatment have been discussed with the patient and family. After consideration of risks, benefits and other options for treatment, the patient has consented to  Procedure(s): MASTECTOMY WITH SENTINEL LYMPH NODE BIOPSY as a surgical intervention .  The patients' history has been reviewed, patient examined, no change in status, stable for surgery.  I have reviewed the patients' chart and labs.  Questions were answered to the patient's satisfaction.     Kandis Cocking  MD

## 2011-03-22 NOTE — Preoperative (Signed)
Beta Blockers   Reason not to administer Beta Blockers:Not Applicable 

## 2011-03-22 NOTE — Anesthesia Postprocedure Evaluation (Signed)
  Anesthesia Post-op Note  Patient: Claudia Stein  Procedure(s) Performed:  MASTECTOMY WITH SENTINEL LYMPH NODE BIOPSY - left mastectomy, sential lymph node biopsy, left axilla  Patient Location: PACU  Anesthesia Type: General  Level of Consciousness: awake  Airway and Oxygen Therapy: Patient Spontanous Breathing  Post-op Pain: none  Post-op Assessment: Post-op Vital signs reviewed  Post-op Vital Signs: stable  Complications: No apparent anesthesia complications

## 2011-03-22 NOTE — Transfer of Care (Signed)
Immediate Anesthesia Transfer of Care Note  Patient: Claudia Stein  Procedure(s) Performed:  MASTECTOMY WITH SENTINEL LYMPH NODE BIOPSY - left mastectomy, sential lymph node biopsy, left axilla  Patient Location: PACU  Anesthesia Type: General  Level of Consciousness: awake, alert , oriented and sedated  Airway & Oxygen Therapy: Patient Spontanous Breathing and Patient connected to nasal cannula oxygen  Post-op Assessment: Report given to PACU RN and Post -op Vital signs reviewed and stable  Post vital signs: Reviewed and stable  Complications: No apparent anesthesia complications

## 2011-03-22 NOTE — H&P (View-Only) (Signed)
ASSESSMENT AND PLAN: 1.  Left breast cancer, IDC.  ER-99%/PR-100%, Ki67 - 13%, Her2Neu - neg.  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  Dr. Khan and [facility] treating physicians.  I discussed the options for breast cancer treatment with the patient.  She has been through this before and understands the plan and surgery.  I discussed reconstruction which she does not want to do.  Since she has had a prior lumpectomy and radiation, she needs a mastectomy this time.  The risks of surgery include, but are not limited to, bleeding, infection, the need for further surgery, and nerve injury.  The patient has been given literature on the treatment of breast cancer.  She is accompanied with her husband.  2.  Right breast cancer, DCIS.  Mastectomy 03/17/2010.  Disease free.  3.  Hypertension. 4.  Hypercholesterolemia. 5.  Thyroid replacement. 6.  She tried anti-estrogen tx before, but did not tolerate it.  Chief Complaint  Patient presents with  . Other    Eval of left breast cancer   REFERRING PHYSICIAN: LITTLE,[name], MD  HISTORY OF PRESENT ILLNESS: Claudia Stein is a 76 y.o. (DOB: 11/15/1933)  whtie female whose primary care physician is LITTLE,[name], MD and comes to me today for left breast cancer.  The patient is well known to me for bilateral breast cancer, DCIS. She had a recent mammogram that showed changes in her left breast. She had retroareolar calcifications which were suspicious. A biopsy on 23 February 2011 showed invasive ductal carcinoma.  The patient herself had noticed a change in her breast or had any problems with her breasts.  She is accompanied with her husband today in our office.   Had left breast lumpectomy for DCIS 04/11/2009.  Had right mastectomy for DCIS 03/17/2010.  Presented to Breast Ca Conf 03/03/2011.  Now has IDC with LVI of left breast  ER/PR pos., Ki67 - 13%, Her2Neu - neg.   Will need a left mastectomy    Past Medical History  Diagnosis Date  .  Cancer 04-16-09, 2012    Breast  . Hyperlipidemia   . Hypertension   . Thyroid disease   . Bruises easily     Past Surgical History  Procedure Date  . Breast surgery 04-16-09    left  Lumpectomy  . Mastectomy November 2011    right breast    Current Outpatient Prescriptions  Medication Sig Dispense Refill  . levothyroxine (SYNTHROID, LEVOTHROID) 50 MCG tablet Take 50 mcg by mouth daily.        . NON FORMULARY 2 (two) times daily. Takes Preservision for eyes       . Omega-3 Fatty Acids (FISH OIL) 1000 MG CAPS Take by mouth daily.        . Calcium Carbonate-Vitamin D (CALCIUM PLUS VITAMIN D PO) Take 1 tablet by mouth daily.        . hydrochlorothiazide 25 MG tablet Take 25 mg by mouth daily.        . losartan (COZAAR) 50 MG tablet Take 25 mg by mouth daily.        . Multiple Vitamins-Minerals (PRESERVISION AREDS 2 PO) Take 2 tablets by mouth daily.        . simvastatin (ZOCOR) 20 MG tablet Take 20 mg by mouth daily.        . vitamin B-12 (CYANOCOBALAMIN) 1000 MCG tablet Take 1,000 mcg by mouth daily.        . vitamin C (ASCORBIC ACID) 500 MG tablet Take 500 mg   by mouth daily.          No Known Allergies  REVIEW OF SYSTEMS: Skin:  No history of rash.  No history of abnormal moles. Infection:  No history of hepatitis or HIV.  No history of MRSA. Neurologic:  No history of stroke.  No history of seizure.  No history of headaches. Cardiac:  Hypertension x 6 years. No history of heart disease.  No history of prior cardiac catheterization.  No history of seeing a cardiologist. Pulmonary:  Does not smoke cigarettes.  No asthma or bronchitis.  No OSA/CPAP.  Endocrine:  No diabetes.  On thyroid replacement.  Hypercholesterolemia. Gastrointestinal:  No history of stomach disease.  No history of liver disease.  No history of gall bladder disease.  No history of pancreas disease.  No history of colon disease. Trouble with nausea with last anesthesia. Urologic:  No history of kidney stones.   No history of bladder infections. Musculoskeletal:  No history of joint or back disease. Hematologic:  No bleeding disorder.  No history of anemia.  Not anticoagulated. Psycho-social:  The patient is oriented.   The patient has no obvious psychologic or social impairment to understanding our conversation and plan.  SOCIAL and FAMILY HISTORY: Accompanied by husband.  PHYSICAL EXAM: BP 142/90  Pulse 78  Temp(Src) 97.1 F (36.2 C) (Temporal)  Resp 16  Ht 5' 2" (1.575 m)  Wt 125 lb (56.7 kg)  BMI 22.86 kg/m2  General: WN WF, who is alert and generally healthy appearing.  HEENT: Normal. Pupils equal. Good dentition. Neck: Supple. No mass.  No thyroid mass.  Carotid pulse okay with no bruit. Lymph Nodes:  No supraclavicular, cervical nodes, or axillary mass. Lungs: Clear to auscultation and symmetric breath sounds. Heart:  RRR. No murmur or rub. Breasts:  Absent right breast.  Left breast with deformity below nipple from prior surgery.  No axillary mass. Abdomen: Soft. No mass. No tenderness. No hernia. Normal bowel sounds.  No abdominal scars. Rectal: Not done. Extremities:  Good strength and ROM  in upper and lower extremities. Neurologic:  Grossly intact to motor and sensory function. Psychiatric: Has normal mood and affect. Behavior is normal.   DATA REVIEWED: Mammogram, path report (to patient).  [name], M.D., FACS [facility] [place] [phone]  UPDATE  Date of surgery:  ________________________________   Planned surgery: _________________________________________________________  The H & P was reviewed, the patient examined, and no change has occurred in the patient's condition or plan of care since this H & P was completed.  ____________________________        Date:  _________________   Time:  _________ [name],  MD, FACS  

## 2011-03-22 NOTE — Plan of Care (Signed)
Problem: Consults Goal: Diagnosis-Modified Radical Mastectomy Outcome: Completed/Met Date Met:  03/22/11 Left Mastectomy

## 2011-03-23 ENCOUNTER — Encounter (HOSPITAL_COMMUNITY): Payer: Self-pay | Admitting: Surgery

## 2011-03-23 MED ORDER — HYDROCODONE-ACETAMINOPHEN 5-325 MG PO TABS: 1.0000 | ORAL_TABLET | Freq: Four times a day (QID) | ORAL | Status: AC | PRN

## 2011-03-23 NOTE — Discharge Summary (Signed)
  This office note has been dictated. #[id number]   03/23/2011

## 2011-03-23 NOTE — Discharge Summary (Signed)
[name], [name] NO.:  [id number]  MEDICAL RECORD NO.:  [id number]  LOCATION:  5118                         FACILITY:  [facility]  PHYSICIAN:  Sandria Bales. Ezzard Standing, M.D.  DATE OF BIRTH:  19-Oct-1933  DATE OF ADMISSION:  03/22/2011 DATE OF DISCHARGE:  03/23/2011                              DISCHARGE SUMMARY  DISCHARGE DIAGNOSES: 1. Left breast cancer, invasive ductal carcinoma, final pathology     pending. 2. History of right breast cancer ductal carcinoma in situ treated by     mastectomy in 2011. 3. Hypertension. 4. Hypercholesterolemia. 5. Thyroid replacement.  OPERATIONS PERFORMED:  The patient underwent a left simple mastectomy, injection of methylene blue, and left axillary sentinel lymph node biopsy on March 22, 2011.  HISTORY OF ILLNESS:  Claudia Stein is a 77 year old white female who is a patient of Dr. Aida Puffer, who has had a history of a left breast cancer treated with lumpectomy in December 2010, for DCIS.  She did well until recently, had changes in her mammogram which prompted a biopsy of her left breast.  This showed an invasive ductal carcinoma which is ER 99%, PR 100%, Ki 67, 13%, HER-2/neu was negative.  Because this was a second malignancy in the same breast, discussion was carried out with the patient about proceeding with mastectomy and she comes to the hospital for that reason on this hospitalization.  COMORBID PROBLEMS: 1. She had right breast cancer which is ductal carcinoma in situ     treated with mastectomy on March 17, 2010.  She is disease free     from this. 2. Hypertension. 3. Hypercholesterolemia. 4. She is on thyroid replacement.  HOSPITAL COURSE:  She was taken to the operating room on the day of admission where she underwent a left mastectomy and a left axillary sentinel lymph node biopsy.  At the time of surgery, the left axillary sentinel lymph node by Dr. Pecola Leisure is negative.  Final pathology is pending at  the time of dictation.    She is now 1 day postop.  She has done very well overnight.  She has had no pain.  She has had minimal drainage from her Jackson-Pratt drains and is ready for discharge.  DISCHARGE INSTRUCTION: 1. She is going to be on a regular diet. 2. She is to empty her Jackson-Pratt drains twice a day and record the     amount. 3. I have given her Vicodin for pain. 4. She can resume her home medications. 5. She is to see me back in 7-10 days for wound check and drain     removal.  Of note, I will probably not be in the office when she comes to see, so she will see one of my partners, and then they will ask her to see me probably the following week.  DISCHARGE CONDITION:  Good.   Sandria Bales. Ezzard Standing, M.D., FACS   DHN/MEDQ  D:  03/23/2011  T:  03/23/2011  Job:  [id number]  cc:   Drue Second, M.D. Radene Gunning, M.D., Ph.D. Aida Puffer, MD

## 2011-03-23 NOTE — Op Note (Signed)
Claudia Stein, Claudia Stein                  ACCOUNT NO.:  [id number]  MEDICAL RECORD NO.:  [id number]  LOCATION:  [facility]                         FACILITY:  [facility]  PHYSICIAN:  Sandria Bales. Ezzard Standing, M.D.  DATE OF BIRTH:  August 05, 1933  DATE OF PROCEDURE:  22 Mar 2011                              OPERATIVE REPORT  PREOPERATIVE DIAGNOSIS:  Invasive ductal carcinoma of the left breast, T1, N0.  POSTOPERATIVE DIAGNOSIS:  Invasive ductal carcinoma of the left breast, T1, N0.  PROCEDURE:  Left simple mastectomy, injection of methylene blue, left axillary sentinel lymph node biopsy.  SURGEONS:  Sandria Bales. Ezzard Standing, M.D.  ASSISTANT:  There is no first assistant.  ESTIMATED BLOOD LOSS:  100 mL.  DRAINS:  Left in were 219 Blake drains.  INDICATION FOR PROCEDURE:  Ms. Claudia Stein is a 77 year old white female, who sees Dr. Aida Puffer as her primary care doctor.  She has had a prior right breast cancer with a mastectomy in November 2011.  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  We had discussions about really the only option would be treating her with a left mastectomy.  I did speak to her and offered her breast reconstruction which she was not interested in.  The potential risks of surgery include, but are not limited to, bleeding, infection, the need for further surgery, and nerve injury.  OPERATIVE NOTE:  The patient is in the holding area.  She had 1 millicuries technetium sulfur colloid injected in her periareolar space for the purpose of finding a sentinel lymph node.  She was then taken to room number 2 where she underwent general endotracheal anesthesia.  Her left chest was prepped with ChloraPrep and sterilely draped.  She was given 1 g of Ancef on initial procedure.  I injected 1 mL of 40% methylene blue in the periareolar space.  Time-out was held and surgical checklist run.  I created mastectomy  flaps.  I tried to match the other side.  She has some scars in the inferior aspect of her left breast from the prior lumpectomy.  I got down to the axilla and cut down to the axilla where I found a hot node which had counts about 90-100 with a background of almost 0, and this was sent for sentinel node.  Dr. Pecola Leisure called back and said the sentinel node was negative.  I then proceeded with mastectomy, I created flaps going cranially to 2 fingerbreadths below the clavicle.  I went medially to the edge of the sternum and inferiorly to the investing fascia of the rectus muscle.  I went laterally to the latissimus dorsi muscle and reflected the breast off the pectoralis muscle.  I marked the specimen with a long suture in the lateral aspect and this was sent to pathology.  I then excised about another 1.5 to 2 cm of her skin on the superior flap.  I marked the skin as a long suture at the inferior aspect and short suture medially, and this was sent as a specimen.  I then irrigated the wound  with 2 L of saline.  I placed two 19 Blake drains through inferior stab wounds and sewed these in place with 2-0 nylon sutures.  I closed the wound with interrupted 3-0 Vicryl sutures, and then closed the skin with a skin gun and sterilely dressed with 4x4s, Hypafix and wrapped her chest with a breast wrap.  The patient tolerated the procedure well, was transported to recovery room in good condition.  Sponge and needle count were correct.   Sandria Bales. Ezzard Standing, M.D., FACS   DHN/MEDQ  D:  03/22/2011  T:  03/22/2011  Job:  [id number]  cc:   Aida Puffer, MD Radene Gunning, M.D., Ph.D. Drue Second, M.D.

## 2011-03-23 NOTE — Progress Notes (Signed)
D/C instructions given to pt and son, both verbalized understanding. Out via wheelchair with son.

## 2011-04-01 ENCOUNTER — Ambulatory Visit (INDEPENDENT_AMBULATORY_CARE_PROVIDER_SITE_OTHER): Payer: Medicare Other | Admitting: Surgery

## 2011-04-01 ENCOUNTER — Encounter (INDEPENDENT_AMBULATORY_CARE_PROVIDER_SITE_OTHER): Payer: Self-pay | Admitting: Surgery

## 2011-04-01 VITALS — BP 142/90 | HR 72 | Temp 99.2°F | Resp 20 | Ht 62.0 in | Wt 121.4 lb

## 2011-04-01 DIAGNOSIS — C50919 Malignant neoplasm of unspecified site of unspecified female breast: Secondary | ICD-10-CM

## 2011-04-01 DIAGNOSIS — C50911 Malignant neoplasm of unspecified site of right female breast: Secondary | ICD-10-CM

## 2011-04-01 NOTE — Patient Instructions (Signed)
You can shower tomorrow. See Dr Ezzard Standing next week as scheduled. Call if any problems

## 2011-04-01 NOTE — Progress Notes (Signed)
Claudia Stein    [id number] 04/01/2011    1933/10/20   CC: Post op L mastectomy  HPI: The patient returns for post op follow-up. She underwent a Left mastectomy on 03/21/2010. Over all she feels that she is doing well. She has had almost no pain. Neither drain is producing more than 10cc/day  PE: The incision is healing nicely and there is no evidence of infection or hematoma.  The drains are Both removed. A few staples also removed.   IMPRESSION: Patient doing well. She is scheduled to see Dr Ezzard Standing next week  PLAN: Her next visit will be in about a week.

## 2011-04-08 ENCOUNTER — Encounter (INDEPENDENT_AMBULATORY_CARE_PROVIDER_SITE_OTHER): Payer: Self-pay | Admitting: Surgery

## 2011-04-08 ENCOUNTER — Ambulatory Visit (INDEPENDENT_AMBULATORY_CARE_PROVIDER_SITE_OTHER): Payer: Medicare Other | Admitting: Surgery

## 2011-04-08 DIAGNOSIS — C50911 Malignant neoplasm of unspecified site of right female breast: Secondary | ICD-10-CM

## 2011-04-08 DIAGNOSIS — Z853 Personal history of malignant neoplasm of breast: Secondary | ICD-10-CM

## 2011-04-08 DIAGNOSIS — C50919 Malignant neoplasm of unspecified site of unspecified female breast: Secondary | ICD-10-CM

## 2011-04-08 NOTE — Progress Notes (Signed)
Post op visit.  ASSESSMENT AND PLAN: 1.  Left breast cancer, IDC.  ER-99%/PR-100%, Ki67 - 13%, Her2Neu - neg.  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  Dr. Welton Flakes and [facility] treating physicians.  Left mastectomy 03/22/2011 showed no residual cancer!  Wound is doing well.  She'll see me back in 6 months.  2.  Right breast cancer, DCIS.  Mastectomy 03/17/2010.  Disease free.  3.  Hypertension. 4.  Hypercholesterolemia. 5.  Thyroid replacement. 6.  She tried anti-estrogen tx before, but did not tolerate it.  Chief Complaint  Patient presents with  . Routine Post Op    Po remove sutures   REFERRING PHYSICIAN: LITTLE,[name], MD  HISTORY OF PRESENT ILLNESS: Claudia Stein is a 77 y.o. (DOB: 1933/09/30)  whtie female whose primary care physician is LITTLE,[name], MD and comes to me today for f/u for left mastectomy for left breast cancer.  The patient is well known to me for bilateral breast cancer, DCIS. She had a recent mammogram that showed changes in her left breast. She had retroareolar calcifications which were suspicious. A biopsy on 23 February 2011 showed invasive ductal carcinoma.  I did a left mastectomy on 03/22/2011.  She come for wound check today.  She is accompanied with her husband today in our office.   Had left breast lumpectomy for DCIS 04/11/2009.  Had right mastectomy for DCIS 03/17/2010.  Presented to Breast Ca Conf 03/03/2011.  Now has IDC with LVI of left breast.  On her final pathology, she had no residual breast cancer.  ER/PR pos., Ki67 - 13%, Her2Neu - neg.   Past Medical History  Diagnosis Date  . Hyperlipidemia   . Hypertension   . Thyroid disease   . Bruises easily   . PONV (postoperative nausea and vomiting)   . Cancer 04-16-09, 2012    Breast- bilateral    Current Outpatient Prescriptions  Medication Sig Dispense Refill  . Calcium Carbonate-Vitamin D (CALCIUM PLUS VITAMIN D PO) Take 1 tablet by mouth daily.        . hydrochlorothiazide 25 MG  tablet Take 25 mg by mouth daily.        Marland Kitchen levothyroxine (SYNTHROID, LEVOTHROID) 50 MCG tablet Take 50 mcg by mouth daily.        Marland Kitchen losartan (COZAAR) 50 MG tablet Take 25 mg by mouth daily.        . Multiple Vitamins-Minerals (PRESERVISION AREDS 2 PO) Take 2 tablets by mouth daily.        . NON FORMULARY 2 (two) times daily. Takes Preservision for eyes       . Omega-3 Fatty Acids (FISH OIL) 1000 MG CAPS Take by mouth daily.        . simvastatin (ZOCOR) 20 MG tablet Take 20 mg by mouth daily.        . simvastatin (ZOCOR) 5 MG tablet Take 5 mg by mouth at bedtime. TAKING A 20 MG AND AN 5 MG TABLET TOGETHER TO = 25MG        . vitamin B-12 (CYANOCOBALAMIN) 1000 MCG tablet Take 1,000 mcg by mouth daily.        . vitamin C (ASCORBIC ACID) 500 MG tablet Take 500 mg by mouth daily.          No Known Allergies  REVIEW OF SYSTEMS: Skin:  No history of rash.  No history of abnormal moles. Infection:  No history of hepatitis or HIV.  No history of MRSA. Neurologic:  No history of stroke.  No  history of seizure.  No history of headaches. Cardiac:  Hypertension x 6 years. No history of heart disease.  No history of prior cardiac catheterization.  No history of seeing a cardiologist. Pulmonary:  Does not smoke cigarettes.  No asthma or bronchitis.  No OSA/CPAP.  Endocrine:  No diabetes.  On thyroid replacement.  Hypercholesterolemia. Gastrointestinal:  No history of stomach disease.  No history of liver disease.  No history of gall bladder disease.  No history of pancreas disease.  No history of colon disease. Trouble with nausea with last anesthesia. Urologic:  No history of kidney stones.  No history of bladder infections. Musculoskeletal:  No history of joint or back disease. Hematologic:  No bleeding disorder.  No history of anemia.  Not anticoagulated. Psycho-social:  The patient is oriented.   The patient has no obvious psychologic or social impairment to understanding our conversation and  plan.  SOCIAL and FAMILY HISTORY: Accompanied by husband.  PHYSICAL EXAM: BP 144/86  Pulse 76  Temp(Src) 98.4 F (36.9 C) (Temporal)  Resp 12  Ht 5\' 2"  (1.575 m)  Wt 122 lb 3.2 oz (55.43 kg)  BMI 22.35 kg/m2  General: WN WF, who is alert and generally healthy appearing.  Lymph Nodes:  No supraclavicular, cervical nodes, or axillary mass.  Moving left arm well. She swept leaves the day she went home from surgery. Breasts:   Absent right breast.   S/P left mastectomy.  Dr. Jamey Ripa had removed the drains.  I removed the staples.  DATA REVIEWED: Path report to patient.  Ovidio Kin, M.D., [facility], [place] [phone]

## 2011-05-03 ENCOUNTER — Telehealth: Payer: Self-pay | Admitting: Oncology

## 2011-05-03 NOTE — Telephone Encounter (Signed)
called pts home and provided appts in feb2013

## 2011-06-04 ENCOUNTER — Ambulatory Visit (HOSPITAL_BASED_OUTPATIENT_CLINIC_OR_DEPARTMENT_OTHER): Payer: Medicare Other | Admitting: Oncology

## 2011-06-04 ENCOUNTER — Other Ambulatory Visit (HOSPITAL_BASED_OUTPATIENT_CLINIC_OR_DEPARTMENT_OTHER): Payer: Medicare Other | Admitting: Lab

## 2011-06-04 VITALS — BP 180/76 | HR 66 | Temp 98.5°F | Ht 61.5 in | Wt 122.9 lb

## 2011-06-04 DIAGNOSIS — D0590 Unspecified type of carcinoma in situ of unspecified breast: Secondary | ICD-10-CM

## 2011-06-04 DIAGNOSIS — Z9010 Acquired absence of unspecified breast and nipple: Secondary | ICD-10-CM

## 2011-06-04 DIAGNOSIS — C50919 Malignant neoplasm of unspecified site of unspecified female breast: Secondary | ICD-10-CM

## 2011-06-04 LAB — CBC WITH DIFFERENTIAL/PLATELET
Basophils Absolute: 0 10*3/uL (ref 0.0–0.1)
EOS%: 2 % (ref 0.0–7.0)
Eosinophils Absolute: 0.1 10*3/uL (ref 0.0–0.5)
HCT: 41.5 % (ref 34.8–46.6)
HGB: 14.2 g/dL (ref 11.6–15.9)
MCH: 30.1 pg (ref 25.1–34.0)
MCV: 88.2 fL (ref 79.5–101.0)
MONO%: 9.7 % (ref 0.0–14.0)
NEUT#: 2.6 10*3/uL (ref 1.5–6.5)
NEUT%: 59.5 % (ref 38.4–76.8)
RDW: 13.1 % (ref 11.2–14.5)

## 2011-06-04 LAB — COMPREHENSIVE METABOLIC PANEL
AST: 18 U/L (ref 0–37)
Albumin: 4.2 g/dL (ref 3.5–5.2)
Alkaline Phosphatase: 97 U/L (ref 39–117)
BUN: 11 mg/dL (ref 6–23)
Calcium: 9.8 mg/dL (ref 8.4–10.5)
Chloride: 100 mEq/L (ref 96–112)
Creatinine, Ser: 0.74 mg/dL (ref 0.50–1.10)
Glucose, Bld: 71 mg/dL (ref 70–99)
Potassium: 3.9 mEq/L (ref 3.5–5.3)

## 2011-06-07 NOTE — Progress Notes (Signed)
OFFICE PROGRESS NOTE  CC  [name],[name], MD, MD [address] [place] [address] Dr. 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 Patient initially underwent a lumpectomy on 04/16/2009 followed by radiation therapy to the left breast in March 2011. She was then begun on tamoxifen but after 5 doses she was unable to tolerate it and discontinued it. 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 On 03/22/2011 she underwent a left mastectomy.  #2Patient also was found to have a right breast cancer that was a DCIS. She underwent a mastectomy formed on 03/17/2010. 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 The tumor was ER/PR positive.   CURRENT THERAPY: Observation  INTERVAL HISTORY: Claudia Stein 78 y.o. female returns for To today. She had a left mastectomy performed November 2012. Overall she is doing well today she is without any complaints she denies any fevers chills night sweats headaches shortness of breath chest pains palpitations. She recently lost her sister a few months ago. She is quite saddened by this. She does tell me that she has been walking or exercising as much as she had been previously. She denies any myalgias or arthralgias. She has no bleeding problems she has no headaches double vision blurring of vision. She denies any purple paresthesias no hematuria hematochezia melena hemoptysis or hematemesis. Remainder of the 10 point review of systems is negative.  MEDICAL HISTORY: Past Medical History  Diagnosis Date  . Hyperlipidemia   . Hypertension   . Thyroid disease   . Bruises easily   . PONV (postoperative nausea and vomiting)   . Cancer 04-16-09, 2012    Breast- bilateral    ALLERGIES:   has no known  allergies.  MEDICATIONS:  Current Outpatient Prescriptions  Medication Sig Dispense Refill  . Calcium Carbonate-Vitamin D (CALCIUM PLUS VITAMIN D PO) Take 1 tablet by mouth daily.        . hydrochlorothiazide 25 MG tablet Take 25 mg by mouth daily.        Marland Kitchen levothyroxine (SYNTHROID, LEVOTHROID) 50 MCG tablet Take 50 mcg by mouth daily.       Marland Kitchen losartan (COZAAR) 50 MG tablet Take 50 mg by mouth daily. 06/04/11- Pt states she cuts a 100mg  tablet in half.      . Multiple Vitamins-Minerals (PRESERVISION AREDS 2 PO) Take 2 tablets by mouth daily.        . Omega-3 Fatty Acids (FISH OIL) 1000 MG CAPS Take by mouth daily.        . simvastatin (ZOCOR) 20 MG tablet Take 20 mg by mouth daily.        . simvastatin (ZOCOR) 5 MG tablet Take 5 mg by mouth at bedtime. TAKING A 20 MG AND AN 5 MG TABLET TOGETHER TO = 25MG        . vitamin B-12 (CYANOCOBALAMIN) 1000 MCG tablet Take 1,000 mcg by mouth daily.        . vitamin C (ASCORBIC ACID) 500 MG tablet Take 500 mg by mouth daily.          SURGICAL HISTORY:  Past Surgical History  Procedure Date  . Breast surgery 04-16-09    left  Lumpectomy  . Mastectomy November 2011    right breast  . Appendectomy   .  Fracture surgery     broke right ankle in the 80's  . Mastectomy w/ sentinel node biopsy 03/22/2011    Procedure: MASTECTOMY WITH SENTINEL LYMPH NODE BIOPSY;  Surgeon: Kandis Cocking, MD;  Location: [facility] OR;  Service: General;  Laterality: Left;  left mastectomy, sential lymph node biopsy, left axilla    REVIEW OF SYSTEMS:  Pertinent items are noted in HPI.   PHYSICAL EXAMINATION: General appearance: alert, cooperative and appears stated age Head: Normocephalic, without obvious abnormality, atraumatic Neck: no adenopathy, no carotid bruit, no JVD, supple, symmetrical, trachea midline and thyroid not enlarged, symmetric, no tenderness/mass/nodules Lymph nodes: Cervical, supraclavicular, and axillary nodes normal. Resp: clear to auscultation bilaterally  and normal percussion bilaterally Back: symmetric, no curvature. ROM normal. No CVA tenderness. Cardio: regular rate and rhythm, S1, S2 normal, no murmur, click, rub or gallop GI: soft, non-tender; bowel sounds normal; no masses,  no organomegaly Extremities: extremities normal, atraumatic, no cyanosis or edema Neurologic: Alert and oriented X 3, normal strength and tone. Normal symmetric reflexes. Normal coordination and gait Bilateral  mastectomy scars local well-healed no nodularity ECOG PERFORMANCE STATUS: 1 - Symptomatic but completely ambulatory  Blood pressure 180/76, pulse 66, temperature 98.5 F (36.9 C), temperature source Oral, height 5' 1.5" (1.562 m), weight 122 lb 14.4 oz (55.747 kg).  LABORATORY DATA: Lab Results  Component Value Date   WBC 4.3 06/04/2011   HGB 14.2 06/04/2011   HCT 41.5 06/04/2011   MCV 88.2 06/04/2011   PLT 186 06/04/2011      Chemistry      Component Value Date/Time   NA 137 06/04/2011 0806   K 3.9 06/04/2011 0806   CL 100 06/04/2011 0806   CO2 30 06/04/2011 0806   BUN 11 06/04/2011 0806   CREATININE 0.74 06/04/2011 0806      Component Value Date/Time   CALCIUM 9.8 06/04/2011 0806   ALKPHOS 97 06/04/2011 0806   AST 18 06/04/2011 0806   ALT 13 06/04/2011 0806   BILITOT 0.6 06/04/2011 0806       RADIOGRAPHIC STUDIES:  No results found.  ASSESSMENT:78 year old female with    #1 bilateral breast cancers. She is now status post bilateral mastectomies. Overall she's doing well.  PLAN:  Continue to be seen on a yearly basis at this time she knows to call me with any problems questions or concerns.  All questions were answered. The patient knows to call the clinic with any problems, questions or concerns. We can certainly see the patient much sooner if necessary.  I spent 25 minutes counseling the patient face to face. The total time spent in the appointment was 30 minutes.    Drue Second, MD Medical/Oncology [facility] [phone]  (beeper) [phone] (Office)  06/07/2011, 10:01 AM

## 2011-07-20 ENCOUNTER — Telehealth: Payer: Self-pay | Admitting: Oncology

## 2011-07-20 NOTE — Telephone Encounter (Signed)
r/s appts from 11/01/2011 due to the md is on pal that week to 11/08/2011. S/w the pt and she is aware of the r/s appta

## 2011-09-29 ENCOUNTER — Telehealth: Payer: Self-pay | Admitting: Oncology

## 2011-09-29 NOTE — Telephone Encounter (Signed)
S/w the pt and she is aware of her r/s appts from 11/08/2011 to aug due to the md is out of the office in July.

## 2011-10-08 ENCOUNTER — Encounter (INDEPENDENT_AMBULATORY_CARE_PROVIDER_SITE_OTHER): Payer: Self-pay | Admitting: Surgery

## 2011-10-08 ENCOUNTER — Ambulatory Visit (INDEPENDENT_AMBULATORY_CARE_PROVIDER_SITE_OTHER): Payer: Medicare Other | Admitting: Surgery

## 2011-10-08 VITALS — BP 146/84 | HR 70 | Temp 96.9°F | Resp 14 | Ht 62.0 in | Wt 121.0 lb

## 2011-10-08 DIAGNOSIS — C50911 Malignant neoplasm of unspecified site of right female breast: Secondary | ICD-10-CM

## 2011-10-08 DIAGNOSIS — C50919 Malignant neoplasm of unspecified site of unspecified female breast: Secondary | ICD-10-CM

## 2011-10-08 DIAGNOSIS — Z853 Personal history of malignant neoplasm of breast: Secondary | ICD-10-CM

## 2011-10-08 NOTE — Progress Notes (Signed)
Post op visit.  ASSESSMENT AND PLAN: 1.  Left breast cancer, IDC.  ER-99%/PR-100%, Ki67 - 13%, Her2Neu - neg.  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  Dr. Welton Flakes and [facility] treating physicians.  Left mastectomy 03/22/2011 showed no residual cancer.  She looks good and is disease free.  She'll see me back in 6 months.  2.  Right breast cancer, DCIS.  Mastectomy 03/17/2010.  Disease free.  3.  Hypertension. 4.  Hypercholesterolemia. 5.  Thyroid replacement. 6.  She tried anti-estrogen tx before, but did not tolerate it.  Chief Complaint  Patient presents with  . Breast Cancer Long Term Follow Up   REFERRING PHYSICIAN: Aida Puffer, MD, MD  HISTORY OF PRESENT ILLNESS: Claudia Stein is a 78 y.o. (DOB: 23-Jun-1933)  whtie female whose primary care physician is [name],[name], MD, MD and comes to me today for f/u for left mastectomy for left breast cancer.  The patient is well known to me for bilateral breast cancer.  It is raining heavily today. She is doing well.  She has no complaints.  We talked a little about gardens and how the rain is affecting the plants.   Past Medical History  Diagnosis Date  . Hyperlipidemia   . Hypertension   . Thyroid disease   . Bruises easily   . PONV (postoperative nausea and vomiting)   . Cancer 04-16-09, 2012    Breast- bilateral    Current Outpatient Prescriptions  Medication Sig Dispense Refill  . Calcium Carbonate-Vitamin D (CALCIUM PLUS VITAMIN D PO) Take 1 tablet by mouth daily.        . hydrochlorothiazide 25 MG tablet Take 25 mg by mouth daily.        Marland Kitchen levothyroxine (SYNTHROID, LEVOTHROID) 50 MCG tablet Take 50 mcg by mouth daily.       Marland Kitchen losartan (COZAAR) 50 MG tablet Take 50 mg by mouth daily. 06/04/11- Pt states she cuts a 100mg  tablet in half.      . Multiple Vitamins-Minerals (PRESERVISION AREDS 2 PO) Take 2 tablets by mouth daily.        . simvastatin (ZOCOR) 20 MG tablet Take 20 mg by mouth daily.        . simvastatin (ZOCOR) 5 MG  tablet Take 5 mg by mouth at bedtime. TAKING A 20 MG AND AN 5 MG TABLET TOGETHER TO = 25MG        . vitamin B-12 (CYANOCOBALAMIN) 1000 MCG tablet Take 1,000 mcg by mouth daily.        . vitamin C (ASCORBIC ACID) 500 MG tablet Take 500 mg by mouth daily.          No Known Allergies  REVIEW OF SYSTEMS: Cardiac:  Hypertension x 6 years.  Endocrine:  No diabetes.  On thyroid replacement.  Hypercholesterolemia. Gastrointestinal:  No history of stomach disease.  No history of liver disease.  No history of gall bladder disease.  No history of pancreas disease.  No history of colon disease. Trouble with nausea with last anesthesia.  SOCIAL and FAMILY HISTORY: Accompanied by husband.  PHYSICAL EXAM: BP 146/84  Pulse 70  Temp(Src) 96.9 F (36.1 C) (Temporal)  Resp 14  Ht 5\' 2"  (1.575 m)  Wt 121 lb (54.885 kg)  BMI 22.13 kg/m2  General: WN WF, who is alert and generally healthy appearing.  Lymph Nodes:  No supraclavicular, cervical nodes, or axillary mass.  Moving both arms well.  Breasts:  Right:    Absent right breast.  Skin looks good, no  mass.   Left:  Absent left breast.  Skin looks good.  DATA REVIEWED: None new.  Claudia Stein, M.D., [facility], [place] [phone]

## 2011-11-01 ENCOUNTER — Ambulatory Visit: Payer: Medicare Other | Admitting: Oncology

## 2011-11-01 ENCOUNTER — Other Ambulatory Visit: Payer: Medicare Other | Admitting: Lab

## 2011-11-08 ENCOUNTER — Ambulatory Visit: Payer: Medicare Other | Admitting: Oncology

## 2011-11-08 ENCOUNTER — Other Ambulatory Visit: Payer: Medicare Other | Admitting: Lab

## 2011-12-02 ENCOUNTER — Ambulatory Visit: Payer: Medicare Other | Admitting: Oncology

## 2011-12-02 ENCOUNTER — Other Ambulatory Visit: Payer: Medicare Other | Admitting: Lab

## 2012-01-27 ENCOUNTER — Other Ambulatory Visit (HOSPITAL_BASED_OUTPATIENT_CLINIC_OR_DEPARTMENT_OTHER): Payer: Medicare Other | Admitting: Lab

## 2012-01-27 ENCOUNTER — Encounter: Payer: Self-pay | Admitting: Oncology

## 2012-01-27 ENCOUNTER — Telehealth: Payer: Self-pay | Admitting: Oncology

## 2012-01-27 ENCOUNTER — Ambulatory Visit (HOSPITAL_BASED_OUTPATIENT_CLINIC_OR_DEPARTMENT_OTHER): Payer: Medicare Other | Admitting: Oncology

## 2012-01-27 VITALS — BP 185/69 | HR 69 | Temp 98.3°F | Resp 20 | Ht 62.0 in | Wt 116.0 lb

## 2012-01-27 DIAGNOSIS — C50919 Malignant neoplasm of unspecified site of unspecified female breast: Secondary | ICD-10-CM

## 2012-01-27 DIAGNOSIS — Z170 Estrogen receptor positive status [ER+]: Secondary | ICD-10-CM

## 2012-01-27 DIAGNOSIS — C50911 Malignant neoplasm of unspecified site of right female breast: Secondary | ICD-10-CM

## 2012-01-27 DIAGNOSIS — D0590 Unspecified type of carcinoma in situ of unspecified breast: Secondary | ICD-10-CM

## 2012-01-27 DIAGNOSIS — Z853 Personal history of malignant neoplasm of breast: Secondary | ICD-10-CM

## 2012-01-27 LAB — CBC WITH DIFFERENTIAL/PLATELET
Basophils Absolute: 0 10*3/uL (ref 0.0–0.1)
Eosinophils Absolute: 0.1 10*3/uL (ref 0.0–0.5)
HCT: 39.9 % (ref 34.8–46.6)
HGB: 13.6 g/dL (ref 11.6–15.9)
MCH: 29.9 pg (ref 25.1–34.0)
NEUT#: 2.5 10*3/uL (ref 1.5–6.5)
NEUT%: 52.2 % (ref 38.4–76.8)
RDW: 13.2 % (ref 11.2–14.5)
lymph#: 1.7 10*3/uL (ref 0.9–3.3)

## 2012-01-27 LAB — COMPREHENSIVE METABOLIC PANEL (CC13)
AST: 15 U/L (ref 5–34)
Albumin: 3.6 g/dL (ref 3.5–5.0)
BUN: 11 mg/dL (ref 7.0–26.0)
CO2: 30 mEq/L — ABNORMAL HIGH (ref 22–29)
Calcium: 10.1 mg/dL (ref 8.4–10.4)
Chloride: 102 mEq/L (ref 98–107)
Creatinine: 0.8 mg/dL (ref 0.6–1.1)
Glucose: 83 mg/dl (ref 70–99)
Potassium: 3.9 mEq/L (ref 3.5–5.1)

## 2012-01-27 NOTE — Patient Instructions (Addendum)
Doing great no clinical evidence of recurrent Breast cancer  I will see you back in 1 year

## 2012-01-27 NOTE — Telephone Encounter (Signed)
gve the pt her sept 2014 appt calendar °

## 2012-01-27 NOTE — Progress Notes (Signed)
OFFICE PROGRESS NOTE  CC  Claudia Puffer, MD [address] [place] [address] Dr. 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 Patient initially underwent a lumpectomy on 04/16/2009 followed by radiation therapy to the left breast in March 2011. She was then begun on tamoxifen but after 5 doses she was unable to tolerate it and discontinued it. 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 On 03/22/2011 she underwent a left mastectomy.  #2Patient also was found to have a right breast cancer that was a DCIS. She underwent a mastectomy formed on 03/17/2010. 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 The tumor was ER/PR positive.   CURRENT THERAPY: Observation  INTERVAL HISTORY: Claudia Stein 78 y.o. female returns for To today.  Overall she is doing well today she is without any complaints she denies any fevers chills night sweats headaches shortness of breath chest pains palpitations. She recently lost her sister a few months ago. She is quite saddened by this. She does tell me that she has been walking or exercising as much as she had been previously. She denies any myalgias or arthralgias. She has no bleeding problems she has no headaches double vision blurring of vision. She denies any purple paresthesias no hematuria hematochezia melena hemoptysis or hematemesis. Remainder of the 10 point review of systems is negative.  MEDICAL HISTORY: Past Medical History  Diagnosis Date  . Hyperlipidemia   . Hypertension   . Thyroid disease   . Bruises easily   . PONV (postoperative nausea and vomiting)   . Cancer 04-16-09, 2012    Breast- bilateral    ALLERGIES:   has no known allergies.  MEDICATIONS:  Current Outpatient Prescriptions    Medication Sig Dispense Refill  . Calcium Carbonate-Vitamin D (CALCIUM PLUS VITAMIN D PO) Take 1 tablet by mouth daily.        . hydrochlorothiazide 25 MG tablet Take 25 mg by mouth daily.        Marland Kitchen levothyroxine (SYNTHROID, LEVOTHROID) 50 MCG tablet Take 50 mcg by mouth daily.       Marland Kitchen losartan (COZAAR) 50 MG tablet Take 50 mg by mouth daily. 06/04/11- Pt states she cuts a 100mg  tablet in half.      . Multiple Vitamins-Minerals (PRESERVISION AREDS 2 PO) Take 2 tablets by mouth daily.        . simvastatin (ZOCOR) 20 MG tablet Take 20 mg by mouth daily.        . simvastatin (ZOCOR) 5 MG tablet Take 5 mg by mouth at bedtime. TAKING A 20 MG AND AN 5 MG TABLET TOGETHER TO = 25MG        . vitamin B-12 (CYANOCOBALAMIN) 1000 MCG tablet Take 1,000 mcg by mouth daily.        . vitamin C (ASCORBIC ACID) 500 MG tablet Take 500 mg by mouth daily.          SURGICAL HISTORY:  Past Surgical History  Procedure Date  . Breast surgery 04-16-09    left  Lumpectomy  . Mastectomy November 2011    right breast  . Appendectomy   . Fracture surgery     broke right ankle in the 80's  . Mastectomy w/ sentinel node biopsy 03/22/2011    Procedure: MASTECTOMY WITH SENTINEL  LYMPH NODE BIOPSY;  Surgeon: Kandis Cocking, MD;  Location: [facility] OR;  Service: General;  Laterality: Left;  left mastectomy, sential lymph node biopsy, left axilla    REVIEW OF SYSTEMS:  Pertinent items are noted in HPI.   PHYSICAL EXAMINATION: General appearance: alert, cooperative and appears stated age Head: Normocephalic, without obvious abnormality, atraumatic Neck: no adenopathy, no carotid bruit, no JVD, supple, symmetrical, trachea midline and thyroid not enlarged, symmetric, no tenderness/mass/nodules Lymph nodes: Cervical, supraclavicular, and axillary nodes normal. Resp: clear to auscultation bilaterally and normal percussion bilaterally Back: symmetric, no curvature. ROM normal. No CVA tenderness. Cardio: regular rate and rhythm, S1,  S2 normal, no murmur, click, rub or gallop GI: soft, non-tender; bowel sounds normal; no masses,  no organomegaly Extremities: extremities normal, atraumatic, no cyanosis or edema Neurologic: Alert and oriented X 3, normal strength and tone. Normal symmetric reflexes. Normal coordination and gait Bilateral  mastectomy scars local well-healed no nodularity ECOG PERFORMANCE STATUS: 1 - Symptomatic but completely ambulatory  Blood pressure 185/69, pulse 69, temperature 98.3 F (36.8 C), temperature source Oral, resp. rate 20, height 5\' 2"  (1.575 m), weight 116 lb (52.617 kg).  LABORATORY DATA: Lab Results  Component Value Date   WBC 4.8 01/27/2012   HGB 13.6 01/27/2012   HCT 39.9 01/27/2012   MCV 87.9 01/27/2012   PLT 189 01/27/2012      Chemistry      Component Value Date/Time   NA 140 01/27/2012 1051   NA 137 06/04/2011 0806   K 3.9 01/27/2012 1051   K 3.9 06/04/2011 0806   CL 102 01/27/2012 1051   CL 100 06/04/2011 0806   CO2 30* 01/27/2012 1051   CO2 30 06/04/2011 0806   BUN 11.0 01/27/2012 1051   BUN 11 06/04/2011 0806   CREATININE 0.8 01/27/2012 1051   CREATININE 0.74 06/04/2011 0806      Component Value Date/Time   CALCIUM 10.1 01/27/2012 1051   CALCIUM 9.8 06/04/2011 0806   ALKPHOS 97 01/27/2012 1051   ALKPHOS 97 06/04/2011 0806   AST 15 01/27/2012 1051   AST 18 06/04/2011 0806   ALT 15 01/27/2012 1051   ALT 13 06/04/2011 0806   BILITOT 0.80 01/27/2012 1051   BILITOT 0.6 06/04/2011 0806       RADIOGRAPHIC STUDIES:  No results found.  ASSESSMENT:77 year old female with    #1 bilateral breast cancers. She is now status post bilateral mastectomies. Overall she's doing well.  PLAN:  Continue to be seen on a yearly basis at this time she knows to call me with any problems questions or concerns.  All questions were answered. The patient knows to call the clinic with any problems, questions or concerns. We can certainly see the patient much sooner if necessary.  I spent 15 minutes counseling  the patient face to face. The total time spent in the appointment was 30 minutes.    Drue Second, MD Medical/Oncology [facility] [phone] (beeper) [phone] (Office)  01/27/2012, 12:00 PM

## 2012-04-19 ENCOUNTER — Encounter (INDEPENDENT_AMBULATORY_CARE_PROVIDER_SITE_OTHER): Payer: Self-pay | Admitting: Surgery

## 2012-04-19 ENCOUNTER — Ambulatory Visit (INDEPENDENT_AMBULATORY_CARE_PROVIDER_SITE_OTHER): Payer: Medicare Other | Admitting: Surgery

## 2012-04-19 VITALS — BP 162/80 | HR 60 | Temp 98.1°F | Resp 16 | Ht 62.0 in | Wt 116.4 lb

## 2012-04-19 DIAGNOSIS — C50919 Malignant neoplasm of unspecified site of unspecified female breast: Secondary | ICD-10-CM

## 2012-04-19 DIAGNOSIS — C50911 Malignant neoplasm of unspecified site of right female breast: Secondary | ICD-10-CM

## 2012-04-19 DIAGNOSIS — Z853 Personal history of malignant neoplasm of breast: Secondary | ICD-10-CM

## 2012-04-19 NOTE — Progress Notes (Signed)
Post op visit.  ASSESSMENT AND PLAN: 1.  Left breast cancer, IDC.  ER-99%/PR-100%, Ki67 - 13%, Her2Neu - neg.  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  Dr. Welton Flakes and [facility] treating physicians.  Left mastectomy 03/22/2011 showed no residual cancer.  She looks good and is disease free.  She'll see me back in 6 months.  2.  Right breast cancer, DCIS.  Mastectomy 03/17/2010.  Disease free.  3.  Hypertension. 4.  Hypercholesterolemia. 5.  Thyroid replacement. 6.  She tried anti-estrogen tx before, but did not tolerate it.  Chief Complaint  Patient presents with  . Breast Cancer Long Term Follow Up    42mo br ck   REFERRING PHYSICIAN: LITTLE,[name], MD  HISTORY OF PRESENT ILLNESS: Claudia Stein is a 78 y.o. (DOB: 1933/08/20)  whtie female whose primary care physician is LITTLE,[name], MD and comes to me today for f/u for bilateral mastectomies for bilateral breast cancer.  She is doing well.  She has no specific concern or mass.  Past Medical History  Diagnosis Date  . Hyperlipidemia   . Hypertension   . Thyroid disease   . Bruises easily   . PONV (postoperative nausea and vomiting)   . Cancer 04-16-09, 2012    Breast- bilateral    Current Outpatient Prescriptions  Medication Sig Dispense Refill  . Calcium Carbonate-Vitamin D (CALCIUM PLUS VITAMIN D PO) Take 1 tablet by mouth daily.        . hydrochlorothiazide 25 MG tablet Take 25 mg by mouth daily.        Marland Kitchen levothyroxine (SYNTHROID, LEVOTHROID) 50 MCG tablet Take 50 mcg by mouth daily.       Marland Kitchen losartan (COZAAR) 50 MG tablet Take 50 mg by mouth daily. 06/04/11- Pt states she cuts a 100mg  tablet in half.      . Multiple Vitamins-Minerals (PRESERVISION AREDS 2 PO) Take 2 tablets by mouth daily.        . simvastatin (ZOCOR) 20 MG tablet Take 20 mg by mouth daily.        . simvastatin (ZOCOR) 5 MG tablet Take 5 mg by mouth at bedtime. TAKING A 20 MG AND AN 5 MG TABLET TOGETHER TO = 25MG        . vitamin B-12 (CYANOCOBALAMIN) 1000  MCG tablet Take 1,000 mcg by mouth daily.        . vitamin C (ASCORBIC ACID) 500 MG tablet Take 500 mg by mouth daily.          No Known Allergies  REVIEW OF SYSTEMS: Cardiac:  Hypertension x 6 years.  Endocrine:  No diabetes.  On thyroid replacement.  Hypercholesterolemia. Gastrointestinal:  No history of stomach disease.  No history of liver disease.  No history of gall bladder disease.  No history of pancreas disease.  No history of colon disease. Trouble with nausea with last anesthesia.  SOCIAL and FAMILY HISTORY: Accompanied by husband.  PHYSICAL EXAM: BP 162/80  Pulse 60  Temp 98.1 F (36.7 C) (Temporal)  Resp 16  Ht 5\' 2"  (1.575 m)  Wt 116 lb 6.1 oz (52.79 kg)  BMI 21.29 kg/m2  General: WN WF, who is alert and generally healthy appearing.  Lymph Nodes:  No supraclavicular, cervical nodes, or axillary mass.  Moving both arms well.  Breasts:  Right:    Absent right breast.  Skin looks good, no mass.   Left:  Absent left breast.  Skin looks good.  DATA REVIEWED: None new.  Ovidio Kin, M.D., [facility], [place] [id number]

## 2012-09-21 ENCOUNTER — Telehealth (INDEPENDENT_AMBULATORY_CARE_PROVIDER_SITE_OTHER): Payer: Self-pay

## 2012-09-21 NOTE — Telephone Encounter (Signed)
V/M appointment  With Dr. Ezzard Standing 7/16/142 10am

## 2012-11-15 ENCOUNTER — Ambulatory Visit (INDEPENDENT_AMBULATORY_CARE_PROVIDER_SITE_OTHER): Payer: Medicare Other | Admitting: Surgery

## 2012-11-15 ENCOUNTER — Encounter (INDEPENDENT_AMBULATORY_CARE_PROVIDER_SITE_OTHER): Payer: Self-pay | Admitting: Surgery

## 2012-11-15 VITALS — BP 148/82 | HR 78 | Resp 14 | Ht 60.0 in | Wt 120.4 lb

## 2012-11-15 DIAGNOSIS — C50911 Malignant neoplasm of unspecified site of right female breast: Secondary | ICD-10-CM

## 2012-11-15 DIAGNOSIS — C50919 Malignant neoplasm of unspecified site of unspecified female breast: Secondary | ICD-10-CM

## 2012-11-15 DIAGNOSIS — Z853 Personal history of malignant neoplasm of breast: Secondary | ICD-10-CM

## 2012-11-15 NOTE — Progress Notes (Signed)
Post op visit.  ASSESSMENT AND PLAN: 1.  Left breast cancer, IDC.  ER-99%/PR-100%, Ki67 - 13%, Her2Neu - neg.  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  Dr. Welton Flakes and [facility] treating physicians.  Left mastectomy 03/22/2011 showed no residual cancer.  She looks good and is disease free.  She'll see me back in 6 months.  2.  Right breast cancer, DCIS.  Mastectomy 03/17/2010.  Disease free.  3.  Hypertension. 4.  Hypercholesterolemia. 5.  Thyroid replacement. 6.  She tried anti-estrogen tx before, but did not tolerate it.  Chief Complaint  Patient presents with  . Breast Cancer Long Term Follow Up   REFERRING PHYSICIAN: LITTLE,[name], MD  HISTORY OF PRESENT ILLNESS: Claudia Stein is a 79 y.o. (DOB: [birth year])  white female whose primary care physician is LITTLE,[name], MD and comes to me today for f/u for bilateral mastectomies for bilateral breast cancer.  She is doing well.  She has no specific concern or mass.  She talked about her daughter in law (who has significant social issues) and their grandson (who is 44 and in high school).  They had to drive to [place]. To pick up their grandson from his mother.  They now have a court order to keep her away from him.  He lives with his father up here.  Past Medical History  Diagnosis Date  . Hyperlipidemia   . Hypertension   . Thyroid disease   . Bruises easily   . PONV (postoperative nausea and vomiting)   . Cancer 04-16-09, 2012    Breast- bilateral    Current Outpatient Prescriptions  Medication Sig Dispense Refill  . Calcium Carbonate-Vitamin D (CALCIUM PLUS VITAMIN D PO) Take 1 tablet by mouth daily.        . hydrochlorothiazide 25 MG tablet Take 25 mg by mouth daily.        Marland Kitchen levothyroxine (SYNTHROID, LEVOTHROID) 50 MCG tablet Take 50 mcg by mouth daily.       Marland Kitchen losartan (COZAAR) 50 MG tablet Take 50 mg by mouth daily. 06/04/11- Pt states she cuts a 100mg  tablet in half.      . Multiple Vitamins-Minerals (PRESERVISION AREDS  2 PO) Take 2 tablets by mouth daily.        . simvastatin (ZOCOR) 20 MG tablet Take 20 mg by mouth daily.        . simvastatin (ZOCOR) 5 MG tablet Take 5 mg by mouth at bedtime. TAKING A 20 MG AND AN 5 MG TABLET TOGETHER TO = 25MG        . vitamin B-12 (CYANOCOBALAMIN) 1000 MCG tablet Take 1,000 mcg by mouth daily.        . vitamin C (ASCORBIC ACID) 500 MG tablet Take 500 mg by mouth daily.         No current facility-administered medications for this visit.    No Known Allergies  REVIEW OF SYSTEMS: Cardiac:  Hypertension x 6 years.  Endocrine:  No diabetes.  On thyroid replacement.  Hypercholesterolemia. Gastrointestinal:  No history of stomach disease.  No history of liver disease.  No history of gall bladder disease.  No history of pancreas disease.  No history of colon disease. Trouble with nausea with last anesthesia.  SOCIAL and FAMILY HISTORY: Accompanied by husband.  PHYSICAL EXAM: BP 148/82  Pulse 78  Resp 14  Ht 5' (1.524 m)  Wt 120 lb 6.4 oz (54.613 kg)  BMI 23.51 kg/m2  General: WN WF, who is alert and generally healthy appearing.  Lymph Nodes:  No supraclavicular, cervical nodes, or axillary mass.   Breasts:  Right:    Absent right breast.  Skin looks good, no mass.   Left:  Absent left breast.  Skin looks good. Extremities:  No evidence of lymphedema.  Moving both arms well.   DATA REVIEWED: None new.  Ovidio Kin, M.D., [facility], [place] [phone]

## 2013-01-29 ENCOUNTER — Ambulatory Visit (HOSPITAL_BASED_OUTPATIENT_CLINIC_OR_DEPARTMENT_OTHER): Payer: Medicare Other | Admitting: Oncology

## 2013-01-29 ENCOUNTER — Other Ambulatory Visit: Payer: Self-pay | Admitting: Oncology

## 2013-01-29 ENCOUNTER — Encounter: Payer: Self-pay | Admitting: Oncology

## 2013-01-29 ENCOUNTER — Telehealth: Payer: Self-pay | Admitting: Oncology

## 2013-01-29 ENCOUNTER — Other Ambulatory Visit (HOSPITAL_BASED_OUTPATIENT_CLINIC_OR_DEPARTMENT_OTHER): Payer: Medicare Other | Admitting: Lab

## 2013-01-29 VITALS — BP 176/73 | HR 64 | Temp 98.2°F | Resp 18 | Ht 61.0 in | Wt 120.9 lb

## 2013-01-29 DIAGNOSIS — C50919 Malignant neoplasm of unspecified site of unspecified female breast: Secondary | ICD-10-CM

## 2013-01-29 DIAGNOSIS — C50911 Malignant neoplasm of unspecified site of right female breast: Secondary | ICD-10-CM

## 2013-01-29 DIAGNOSIS — Z9010 Acquired absence of unspecified breast and nipple: Secondary | ICD-10-CM

## 2013-01-29 DIAGNOSIS — Z853 Personal history of malignant neoplasm of breast: Secondary | ICD-10-CM

## 2013-01-29 LAB — CBC WITH DIFFERENTIAL/PLATELET
Basophils Absolute: 0 10*3/uL (ref 0.0–0.1)
EOS%: 1.4 % (ref 0.0–7.0)
HCT: 39.5 % (ref 34.8–46.6)
HGB: 13.4 g/dL (ref 11.6–15.9)
MCH: 29.6 pg (ref 25.1–34.0)
MONO#: 0.4 10*3/uL (ref 0.1–0.9)
NEUT#: 2.9 10*3/uL (ref 1.5–6.5)
RDW: 13.3 % (ref 11.2–14.5)
WBC: 5.2 10*3/uL (ref 3.9–10.3)
lymph#: 1.8 10*3/uL (ref 0.9–3.3)

## 2013-01-29 LAB — COMPREHENSIVE METABOLIC PANEL (CC13)
ALT: 13 U/L (ref 0–55)
AST: 15 U/L (ref 5–34)
Albumin: 3.4 g/dL — ABNORMAL LOW (ref 3.5–5.0)
BUN: 11.9 mg/dL (ref 7.0–26.0)
CO2: 30 mEq/L — ABNORMAL HIGH (ref 22–29)
Calcium: 9.8 mg/dL (ref 8.4–10.4)
Chloride: 104 mEq/L (ref 98–109)
Potassium: 4.5 mEq/L (ref 3.5–5.1)

## 2013-01-29 NOTE — Telephone Encounter (Signed)
No pof upon check. Message to KK re no pof. Pt aware she will be contacted w/appt.

## 2013-01-29 NOTE — Progress Notes (Signed)
OFFICE PROGRESS NOTE  CC  Claudia Puffer, MD [address] [place] [address] Dr. 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 Patient initially underwent a lumpectomy on 04/16/2009 followed by radiation therapy to the left breast in March 2011. She was then begun on tamoxifen but after 5 doses she was unable to tolerate it and discontinued it. 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 On 03/22/2011 she underwent a left mastectomy.  #2Patient also was found to have a right breast cancer that was a DCIS. She underwent a mastectomy formed on 03/17/2010. 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 The tumor was ER/PR positive.   CURRENT THERAPY: Observation  INTERVAL HISTORY: Claudia Stein 79 y.o. female returns for To today.  Overall she is doing well today she is without any complaints she denies any fevers chills night sweats headaches shortness of breath chest pains palpitations. She recently lost her sister a few months ago. She is quite saddened by this. She does tell me that she has been walking or exercising as much as she had been previously. She denies any myalgias or arthralgias. She has no bleeding problems she has no headaches double vision blurring of vision. She denies any purple paresthesias no hematuria hematochezia melena hemoptysis or hematemesis. Remainder of the 10 point review of systems is negative.  MEDICAL HISTORY: Past Medical History  Diagnosis Date  . Hyperlipidemia   . Hypertension   . Thyroid disease   . Bruises easily   . PONV (postoperative nausea and vomiting)   . Cancer 04-16-09, 2012    Breast- bilateral    ALLERGIES:  has No Known Allergies.  MEDICATIONS:  Current Outpatient Prescriptions  Medication  Sig Dispense Refill  . Calcium Carbonate-Vitamin D (CALCIUM PLUS VITAMIN D PO) Take 1 tablet by mouth daily.        . hydrochlorothiazide 25 MG tablet Take 25 mg by mouth daily.        Marland Kitchen levothyroxine (SYNTHROID, LEVOTHROID) 50 MCG tablet Take 50 mcg by mouth daily.       Marland Kitchen losartan (COZAAR) 50 MG tablet Take 50 mg by mouth daily. 06/04/11- Pt states she cuts a 100mg  tablet in half.      . Multiple Vitamins-Minerals (PRESERVISION AREDS 2 PO) Take 2 tablets by mouth daily.        . simvastatin (ZOCOR) 20 MG tablet Take 20 mg by mouth daily.        . simvastatin (ZOCOR) 5 MG tablet Take 5 mg by mouth at bedtime. TAKING A 20 MG AND AN 5 MG TABLET TOGETHER TO = 25MG        . vitamin B-12 (CYANOCOBALAMIN) 1000 MCG tablet Take 1,000 mcg by mouth daily.        . vitamin C (ASCORBIC ACID) 500 MG tablet Take 500 mg by mouth daily.         No current facility-administered medications for this visit.    SURGICAL HISTORY:  Past Surgical History  Procedure Laterality Date  . Breast surgery  04-16-09    left  Lumpectomy  . Mastectomy  November 2011    right breast  . Appendectomy    . Fracture surgery      broke right ankle in the 80's  . Mastectomy  w/ sentinel node biopsy  03/22/2011    Procedure: MASTECTOMY WITH SENTINEL LYMPH NODE BIOPSY;  Surgeon: Kandis Cocking, MD;  Location: [facility] OR;  Service: General;  Laterality: Left;  left mastectomy, sential lymph node biopsy, left axilla    REVIEW OF SYSTEMS:  Pertinent items are noted in HPI.   PHYSICAL EXAMINATION: General appearance: alert, cooperative and appears stated age Head: Normocephalic, without obvious abnormality, atraumatic Neck: no adenopathy, no carotid bruit, no JVD, supple, symmetrical, trachea midline and thyroid not enlarged, symmetric, no tenderness/mass/nodules Lymph nodes: Cervical, supraclavicular, and axillary nodes normal. Resp: clear to auscultation bilaterally and normal percussion bilaterally Back: symmetric, no curvature.  ROM normal. No CVA tenderness. Cardio: regular rate and rhythm, S1, S2 normal, no murmur, click, rub or gallop GI: soft, non-tender; bowel sounds normal; no masses,  no organomegaly Extremities: extremities normal, atraumatic, no cyanosis or edema Neurologic: Alert and oriented X 3, normal strength and tone. Normal symmetric reflexes. Normal coordination and gait Bilateral  mastectomy scars local well-healed no nodularity ECOG PERFORMANCE STATUS: 1 - Symptomatic but completely ambulatory  Blood pressure 176/73, pulse 64, temperature 98.2 F (36.8 C), temperature source Oral, resp. rate 18, height 5\' 1"  (1.549 m), weight 120 lb 14.4 oz (54.84 kg).  LABORATORY DATA: Lab Results  Component Value Date   WBC 5.2 01/29/2013   HGB 13.4 01/29/2013   HCT 39.5 01/29/2013   MCV 87.5 01/29/2013   PLT 193 01/29/2013      Chemistry      Component Value Date/Time   NA 141 01/29/2013 1039   NA 137 06/04/2011 0806   K 4.5 01/29/2013 1039   K 3.9 06/04/2011 0806   CL 102 01/27/2012 1051   CL 100 06/04/2011 0806   CO2 30* 01/29/2013 1039   CO2 30 06/04/2011 0806   BUN 11.9 01/29/2013 1039   BUN 11 06/04/2011 0806   CREATININE 0.8 01/29/2013 1039   CREATININE 0.74 06/04/2011 0806      Component Value Date/Time   CALCIUM 9.8 01/29/2013 1039   CALCIUM 9.8 06/04/2011 0806   ALKPHOS 101 01/29/2013 1039   ALKPHOS 97 06/04/2011 0806   AST 15 01/29/2013 1039   AST 18 06/04/2011 0806   ALT 13 01/29/2013 1039   ALT 13 06/04/2011 0806   BILITOT 0.59 01/29/2013 1039   BILITOT 0.6 06/04/2011 0806       RADIOGRAPHIC STUDIES:  No results found.  ASSESSMENT:79 year old female with    #1 bilateral breast cancers. She is now status post bilateral mastectomies. Overall she's doing well.  PLAN:  Continue to be seen on a yearly basis at this time she knows to call me with any problems questions or concerns.  All questions were answered. The patient knows to call the clinic with any problems, questions or concerns. We can  certainly see the patient much sooner if necessary.  I spent 15 minutes counseling the patient face to face. The total time spent in the appointment was 30 minutes.    Drue Second, MD Medical/Oncology [facility] [phone] (beeper) [phone] (Office)  01/29/2013, 12:03 PM

## 2013-01-29 NOTE — Patient Instructions (Addendum)
Doing well  We will see you back in 1 year 

## 2013-01-31 ENCOUNTER — Telehealth: Payer: Self-pay | Admitting: Oncology

## 2013-05-30 ENCOUNTER — Ambulatory Visit (INDEPENDENT_AMBULATORY_CARE_PROVIDER_SITE_OTHER): Payer: Medicare HMO | Admitting: Surgery

## 2013-05-30 ENCOUNTER — Encounter (INDEPENDENT_AMBULATORY_CARE_PROVIDER_SITE_OTHER): Payer: Self-pay | Admitting: Surgery

## 2013-05-30 VITALS — BP 142/88 | HR 73 | Temp 98.6°F | Resp 14 | Ht 62.0 in | Wt 123.2 lb

## 2013-05-30 DIAGNOSIS — Z853 Personal history of malignant neoplasm of breast: Secondary | ICD-10-CM

## 2013-05-30 DIAGNOSIS — C50911 Malignant neoplasm of unspecified site of right female breast: Secondary | ICD-10-CM

## 2013-05-30 DIAGNOSIS — C50919 Malignant neoplasm of unspecified site of unspecified female breast: Secondary | ICD-10-CM

## 2013-05-30 NOTE — Progress Notes (Signed)
Name:  DEZIYAH Stein MRN:  [id number] DOB:  Dec 02, 1933  ASSESSMENT AND PLAN: 1.  Left breast cancer, IDC.  ER-99%/PR-100%, Ki67 - 13%, Her2Neu - neg.  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  Dr. Humphrey Rolls and [facility] treating physicians.  Left mastectomy 03/22/2011 showed no residual cancer.  She looks good and is disease free.  She'll see me back in 6 months.  2.  Right breast cancer, DCIS.  Mastectomy 03/17/2010.  Disease free. 3.  Hypertension. 4.  Hypercholesterolemia. 5.  Thyroid replacement. 6.  She tried anti-estrogen tx before, but did not tolerate it.  Chief Complaint  Patient presents with  . Breast Cancer Long Term Follow Up    LTFU 6 mo recheck   REFERRING PHYSICIAN: LITTLE,[name], MD  HISTORY OF PRESENT ILLNESS: Claudia Stein is a 80 y.o. (DOB: Apr 23, 1934)  white female whose primary care physician is LITTLE,[name], MD and comes to me today for f/u for bilateral mastectomies for bilateral breast cancer. Her husband is with her. She is doing well.  She has no specific concern or mass.   We talked about a cyst at her right costal margin that is about 1 cm and a seborrheic keratosis at the right shoulder - both benign.  Past Medical History  Diagnosis Date  . Hyperlipidemia   . Hypertension   . Thyroid disease   . Bruises easily   . PONV (postoperative nausea and vomiting)   . Cancer 04-16-09, 2012    Breast- bilateral    Current Outpatient Prescriptions  Medication Sig Dispense Refill  . Calcium Carbonate-Vitamin D (CALCIUM PLUS VITAMIN D PO) Take 1 tablet by mouth daily.        . hydrochlorothiazide 25 MG tablet Take 25 mg by mouth daily.        Marland Kitchen levothyroxine (SYNTHROID, LEVOTHROID) 50 MCG tablet Take 50 mcg by mouth daily.       Marland Kitchen losartan (COZAAR) 50 MG tablet Take 50 mg by mouth daily. 06/04/11- Pt states she cuts a 184m tablet in half.      . Multiple Vitamins-Minerals (PRESERVISION AREDS 2 PO) Take 2 tablets by mouth daily.        . simvastatin (ZOCOR) 20 MG  tablet Take 20 mg by mouth daily.        . simvastatin (ZOCOR) 5 MG tablet Take 5 mg by mouth at bedtime. TAKING A 20 MG AND AN 5 MG TABLET TOGETHER TO = 25MG       . vitamin B-12 (CYANOCOBALAMIN) 1000 MCG tablet Take 1,000 mcg by mouth daily.        . vitamin C (ASCORBIC ACID) 500 MG tablet Take 500 mg by mouth daily.         No current facility-administered medications for this visit.    No Known Allergies  REVIEW OF SYSTEMS: Cardiac:  Hypertension x 6 years.  Endocrine:  No diabetes.  On thyroid replacement.  Hypercholesterolemia. Gastrointestinal:  No history of stomach disease.  No history of liver disease.  No history of gall bladder disease.  No history of pancreas disease.  No history of colon disease. Trouble with nausea with last anesthesia.  SOCIAL and FAMILY HISTORY: Accompanied by husband.  PHYSICAL EXAM: BP 142/88  Pulse 73  Temp(Src) 98.6 F (37 C) (Temporal)  Resp 14  Ht 5' 2"  (1.575 m)  Wt 123 lb 3.2 oz (55.883 kg)  BMI 22.53 kg/m2  General: WN WF, who is alert and generally healthy appearing.  Lymph Nodes:  No supraclavicular,  cervical nodes, or axillary mass.   Breasts:  Right:    Absent right breast.  Skin looks good, no mass.  She has a 1 cm sebaceous cyst along the right costal margin.   Left:  Absent left breast.  Skin looks good. Extremities:  No evidence of lymphedema.  Moving both arms well.  Seborrheic keratosis at right shoulder.  DATA REVIEWED: None new.  Alphonsa Overall, M.D., [facility], [place]

## 2013-12-27 ENCOUNTER — Ambulatory Visit (INDEPENDENT_AMBULATORY_CARE_PROVIDER_SITE_OTHER): Payer: Medicare HMO | Admitting: Surgery

## 2014-01-17 ENCOUNTER — Ambulatory Visit (INDEPENDENT_AMBULATORY_CARE_PROVIDER_SITE_OTHER): Payer: Medicare HMO | Admitting: Surgery

## 2014-01-23 ENCOUNTER — Encounter: Payer: Self-pay | Admitting: Adult Health

## 2014-01-23 NOTE — Progress Notes (Signed)
Patient left message to make sure we take her insurance and in network. I called her back to advise we are.

## 2014-01-29 ENCOUNTER — Telehealth: Payer: Self-pay | Admitting: Hematology and Oncology

## 2014-01-29 NOTE — Telephone Encounter (Signed)
moved 9/30 appt to 10/13 due [facility] late arrival. s/w pt she is aware.

## 2014-01-30 ENCOUNTER — Other Ambulatory Visit: Payer: Medicare Other

## 2014-01-30 ENCOUNTER — Encounter: Payer: Medicare Other | Admitting: Adult Health

## 2014-01-31 NOTE — Progress Notes (Signed)
This encounter was created in error - please disregard.

## 2014-02-12 ENCOUNTER — Telehealth: Payer: Self-pay | Admitting: Hematology and Oncology

## 2014-02-12 ENCOUNTER — Encounter: Payer: Self-pay | Admitting: Adult Health

## 2014-02-12 ENCOUNTER — Other Ambulatory Visit (HOSPITAL_BASED_OUTPATIENT_CLINIC_OR_DEPARTMENT_OTHER): Payer: Medicare HMO

## 2014-02-12 ENCOUNTER — Ambulatory Visit (HOSPITAL_BASED_OUTPATIENT_CLINIC_OR_DEPARTMENT_OTHER): Payer: Medicare HMO | Admitting: Adult Health

## 2014-02-12 VITALS — BP 164/53 | HR 67 | Temp 98.2°F | Resp 20 | Ht 62.0 in | Wt 128.0 lb

## 2014-02-12 DIAGNOSIS — C50919 Malignant neoplasm of unspecified site of unspecified female breast: Secondary | ICD-10-CM

## 2014-02-12 DIAGNOSIS — R21 Rash and other nonspecific skin eruption: Secondary | ICD-10-CM

## 2014-02-12 DIAGNOSIS — Z853 Personal history of malignant neoplasm of breast: Secondary | ICD-10-CM

## 2014-02-12 DIAGNOSIS — C50911 Malignant neoplasm of unspecified site of right female breast: Secondary | ICD-10-CM

## 2014-02-12 LAB — COMPREHENSIVE METABOLIC PANEL (CC13)
ALBUMIN: 3.6 g/dL (ref 3.5–5.0)
ALT: 17 U/L (ref 0–55)
AST: 16 U/L (ref 5–34)
Alkaline Phosphatase: 95 U/L (ref 40–150)
Anion Gap: 5 mEq/L (ref 3–11)
BUN: 8.1 mg/dL (ref 7.0–26.0)
CALCIUM: 9.8 mg/dL (ref 8.4–10.4)
CO2: 31 mEq/L — ABNORMAL HIGH (ref 22–29)
Chloride: 104 mEq/L (ref 98–109)
Creatinine: 1 mg/dL (ref 0.6–1.1)
Glucose: 84 mg/dl (ref 70–140)
Potassium: 3.8 mEq/L (ref 3.5–5.1)
SODIUM: 141 meq/L (ref 136–145)
TOTAL PROTEIN: 7.3 g/dL (ref 6.4–8.3)
Total Bilirubin: 0.46 mg/dL (ref 0.20–1.20)

## 2014-02-12 LAB — CBC WITH DIFFERENTIAL/PLATELET
BASO%: 0.6 % (ref 0.0–2.0)
BASOS ABS: 0 10*3/uL (ref 0.0–0.1)
EOS ABS: 0.1 10*3/uL (ref 0.0–0.5)
EOS%: 1.9 % (ref 0.0–7.0)
HEMATOCRIT: 40.2 % (ref 34.8–46.6)
HGB: 13.3 g/dL (ref 11.6–15.9)
LYMPH%: 33.2 % (ref 14.0–49.7)
MCH: 29.3 pg (ref 25.1–34.0)
MCHC: 33.1 g/dL (ref 31.5–36.0)
MCV: 88.7 fL (ref 79.5–101.0)
MONO#: 0.4 10*3/uL (ref 0.1–0.9)
MONO%: 7.8 % (ref 0.0–14.0)
NEUT%: 56.5 % (ref 38.4–76.8)
NEUTROS ABS: 3.2 10*3/uL (ref 1.5–6.5)
PLATELETS: 194 10*3/uL (ref 145–400)
RBC: 4.53 10*6/uL (ref 3.70–5.45)
RDW: 13.2 % (ref 11.2–14.5)
WBC: 5.7 10*3/uL (ref 3.9–10.3)
lymph#: 1.9 10*3/uL (ref 0.9–3.3)

## 2014-02-12 MED ORDER — KETOCONAZOLE 2 % EX CREA: 1.0000 "application " | TOPICAL_CREAM | Freq: Every day | CUTANEOUS | Status: DC

## 2014-02-12 NOTE — Telephone Encounter (Signed)
lvm for pt regarding to OCT 2016 appt....mailed pt appt sched/avs and letter °

## 2014-02-12 NOTE — Progress Notes (Signed)
OFFICE PROGRESS NOTE  CC  LITTLE,[name], MD [address] [place] [address] Dr. 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 Patient initially underwent a lumpectomy on 04/16/2009 followed by radiation therapy to the left breast in March 2011. She was then begun on tamoxifen but after 5 doses she was unable to tolerate it and discontinued it. 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 On 03/22/2011 she underwent a left mastectomy.  #2Patient also was found to have a right breast cancer that was a DCIS. She underwent a mastectomy formed on 03/17/2010. 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 The tumor was ER/PR positive.   CURRENT THERAPY: Observation  INTERVAL HISTORY: Claudia Stein 80 y.o. female returns for follow up of her h/o breast cancer.  We reviewed her breast cancer history in detail as above.  She is doing well today and has not problems whatsoever.  She denies any new pain, fevers, chills, weight loss, or any further concerns.  We updated her health maintenance below.   MEDICAL HISTORY: Past Medical History  Diagnosis Date  . Hyperlipidemia   . Hypertension   . Thyroid disease   . Bruises easily   . PONV (postoperative nausea and vomiting)   . Cancer 04-16-09, 2012    Breast- bilateral    ALLERGIES:  has No Known Allergies.  MEDICATIONS:  Current Outpatient Prescriptions  Medication Sig Dispense Refill  . Calcium Carbonate-Vitamin D (CALCIUM PLUS VITAMIN D PO) Take 1 tablet by mouth daily.        . hydrochlorothiazide 25 MG tablet Take 25 mg by mouth daily.        Marland Kitchen levothyroxine (SYNTHROID, LEVOTHROID) 50 MCG tablet Take 50 mcg by mouth daily.       Marland Kitchen losartan (COZAAR) 50 MG tablet Take 50 mg by mouth daily. 06/04/11-  Pt states she cuts a 100mg  tablet in half.      . Multiple Vitamins-Minerals (PRESERVISION AREDS 2 PO) Take 2 tablets by mouth daily.        . simvastatin (ZOCOR) 20 MG tablet Take 20 mg by mouth daily.        . simvastatin (ZOCOR) 5 MG tablet Take 5 mg by mouth at bedtime. TAKING A 20 MG AND AN 5 MG TABLET TOGETHER TO = 25MG        . vitamin B-12 (CYANOCOBALAMIN) 1000 MCG tablet Take 1,000 mcg by mouth daily.        . vitamin C (ASCORBIC ACID) 500 MG tablet Take 500 mg by mouth daily.         No current facility-administered medications for this visit.    SURGICAL HISTORY:  Past Surgical History  Procedure Laterality Date  . Breast surgery  04-16-09    left  Lumpectomy  . Mastectomy  November 2011    right breast  . Appendectomy    . Fracture surgery      broke right ankle in the 80's  . Mastectomy w/ sentinel node biopsy  03/22/2011    Procedure: MASTECTOMY WITH SENTINEL LYMPH NODE BIOPSY;  Surgeon: Shann Medal, MD;  Location: [facility];  Service: General;  Laterality: Left;  left mastectomy, sential lymph node biopsy, left axilla    REVIEW OF SYSTEMS:  A 10 point review  of systems was conducted and is otherwise negative except for what is noted above.    Health Maintenance  Mammogram:n/a  Colonoscopy: never, however will be referred by PCP Bone Density Scan:  Hasn't seen PCP in 3 years Pap Smear: 2012 Eye Exam: 2015 Vitamin D Level: last saw PCP 3 years ago Lipid Panel:  Last saw PCP 3 years ago    PHYSICAL EXAMINATION: BP 164/53  Pulse 67  Temp(Src) 98.2 F (36.8 C) (Oral)  Resp 20  Ht 5\' 2"  (1.575 m)  Wt 128 lb (58.06 kg)  BMI 23.41 kg/m2 GENERAL: Patient is a well appearing elderly female in no acute distress HEENT:  Sclerae anicteric.  Oropharynx clear and moist. No ulcerations or evidence of oropharyngeal candidiasis. Neck is supple.  NODES:  No cervical, supraclavicular, or axillary lymphadenopathy palpated.  BREAST EXAM:  S/p bilateral mastectomies, no  nodularity or sign of recurrence.   LUNGS:  Clear to auscultation bilaterally.  No wheezes or rhonchi. HEART:  Regular rate and rhythm. No murmur appreciated. ABDOMEN:  Soft, nontender.  Positive, normoactive bowel sounds. No organomegaly palpated. MSK:  No focal spinal tenderness to palpation. Full range of motion bilaterally in the upper extremities. EXTREMITIES:  No peripheral edema.   SKIN:  Clear with no obvious rashes or skin changes. No nail dyscrasia. NEURO:  Nonfocal. Well oriented.  Appropriate affect. ECOG PERFORMANCE STATUS: 1 - Symptomatic but completely ambulatory   LABORATORY DATA: Lab Results  Component Value Date   WBC 5.7 02/12/2014   HGB 13.3 02/12/2014   HCT 40.2 02/12/2014   MCV 88.7 02/12/2014   PLT 194 02/12/2014      Chemistry      Component Value Date/Time   NA 141 02/12/2014 1211   NA 137 06/04/2011 0806   K 3.8 02/12/2014 1211   K 3.9 06/04/2011 0806   CL 102 01/27/2012 1051   CL 100 06/04/2011 0806   CO2 31* 02/12/2014 1211   CO2 30 06/04/2011 0806   BUN 8.1 02/12/2014 1211   BUN 11 06/04/2011 0806   CREATININE 1.0 02/12/2014 1211   CREATININE 0.74 06/04/2011 0806      Component Value Date/Time   CALCIUM 9.8 02/12/2014 1211   CALCIUM 9.8 06/04/2011 0806   ALKPHOS 95 02/12/2014 1211   ALKPHOS 97 06/04/2011 0806   AST 16 02/12/2014 1211   AST 18 06/04/2011 0806   ALT 17 02/12/2014 1211   ALT 13 06/04/2011 0806   BILITOT 0.46 02/12/2014 1211   BILITOT 0.6 06/04/2011 0806       RADIOGRAPHIC STUDIES:  No results found.  ASSESSMENT:80 year old female with    #1 bilateral breast cancers. She was diagnosed with breast cancer in 2010 when she went for mammogram, and underwent lumpectomy on 04/16/2009 and a 1.5cm DCIS was removed, ER/PR positive.  #2 In 2011 a repeat mammogram demonstrated a right breast mass and she underwent mastectomy that demonstrated HG DCIS with papillary features, 2.5 cm, sentinel node was negative, ER positive.    #3 In 2012, she had  mammogram of left breast and a mass was noted.  Biopsy found invasive ductal carcinoma and DCIS.  She underwent mastectomy on 03/22/2011 and and pathology determined she had atypical ductal hyperplasia.    PLAN:   Claudia Stein is doing well today.  She has no sign of recurrence.  We reviewed her health maintenance in detail.  I recommended she f/u with her PCP, she hasn't seen them in 3 years  She likely needs another bone  density ordered, and she'd like to talk to him about referring her for colonoscopy.    I recommended healthy diet, breast exams, and exercise.    Claudia Stein will return in one year for follow up.    I spent 15 minutes counseling the patient face to face.  The total time spent in the appointment was 30 minutes.    Minette Headland, [place] [phone] 02/12/2014, 1:41 PM

## 2014-02-12 NOTE — Patient Instructions (Signed)
You are doing well.  You have no sign of recurrence.  I recommend healthy diet, exercise, and monthly exams.    We will see you back in 1 year.    Breast Self-Awareness Practicing breast self-awareness may pick up problems early, prevent significant medical complications, and possibly save your life. By practicing breast self-awareness, you can become familiar with how your breasts look and feel and if your breasts are changing. This allows you to notice changes early. It can also offer you some reassurance that your breast health is good. One way to learn what is normal for your breasts and whether your breasts are changing is to do a breast self-exam. If you find a lump or something that was not present in the past, it is best to contact your caregiver right away. Other findings that should be evaluated by your caregiver include nipple discharge, especially if it is bloody; skin changes or reddening; areas where the skin seems to be pulled in (retracted); or new lumps and bumps. Breast pain is seldom associated with cancer (malignancy), but should also be evaluated by a caregiver. HOW TO PERFORM A BREAST SELF-EXAM The best time to examine your breasts is 5-7 days after your menstrual period is over. During menstruation, the breasts are lumpier, and it may be more difficult to pick up changes. If you do not menstruate, have reached menopause, or had your uterus removed (hysterectomy), you should examine your breasts at regular intervals, such as monthly. If you are breastfeeding, examine your breasts after a feeding or after using a breast pump. Breast implants do not decrease the risk for lumps or tumors, so continue to perform breast self-exams as recommended. Talk to your caregiver about how to determine the difference between the implant and breast tissue. Also, talk about the amount of pressure you should use during the exam. Over time, you will become more familiar with the variations of your breasts  and more comfortable with the exam. A breast self-exam requires you to remove all your clothes above the waist. 1. Look at your breasts and nipples. Stand in front of a mirror in a room with good lighting. With your hands on your hips, push your hands firmly downward. Look for a difference in shape, contour, and size from one breast to the other (asymmetry). Asymmetry includes puckers, dips, or bumps. Also, look for skin changes, such as reddened or scaly areas on the breasts. Look for nipple changes, such as discharge, dimpling, repositioning, or redness. 2. Carefully feel your breasts. This is best done either in the shower or tub while using soapy water or when flat on your back. Place the arm (on the side of the breast you are examining) above your head. Use the pads (not the fingertips) of your three middle fingers on your opposite hand to feel your breasts. Start in the underarm area and use  inch (2 cm) overlapping circles to feel your breast. Use 3 different levels of pressure (light, medium, and firm pressure) at each circle before moving to the next circle. The light pressure is needed to feel the tissue closest to the skin. The medium pressure will help to feel breast tissue a little deeper, while the firm pressure is needed to feel the tissue close to the ribs. Continue the overlapping circles, moving downward over the breast until you feel your ribs below your breast. Then, move one finger-width towards the center of the body. Continue to use the  inch (2 cm) overlapping circles  to feel your breast as you move slowly up toward the collar bone (clavicle) near the base of the neck. Continue the up and down exam using all 3 pressures until you reach the middle of the chest. Do this with each breast, carefully feeling for lumps or changes. 3.  Keep a written record with breast changes or normal findings for each breast. By writing this information down, you do not need to depend only on memory for  size, tenderness, or location. Write down where you are in your menstrual cycle, if you are still menstruating. Breast tissue can have some lumps or thick tissue. However, see your caregiver if you find anything that concerns you.  SEEK MEDICAL CARE IF:  You see a change in shape, contour, or size of your breasts or nipples.   You see skin changes, such as reddened or scaly areas on the breasts or nipples.   You have an unusual discharge from your nipples.   You feel a new lump or unusually thick areas.  Document Released: 04/19/2005 Document Revised: 04/05/2012 Document Reviewed: 08/04/2011 [facility] Patient Information 2015 [place], [place]. This information is not intended to replace advice given to you by your health care provider. Make sure you discuss any questions you have with your health care provider.

## 2014-03-07 ENCOUNTER — Telehealth: Payer: Self-pay | Admitting: Cardiology

## 2014-07-12 ENCOUNTER — Telehealth: Payer: Self-pay | Admitting: Adult Health

## 2014-07-12 NOTE — Telephone Encounter (Signed)
Printed and faxed records to:  [facility] Fax number: [phone]

## 2014-09-26 NOTE — Telephone Encounter (Signed)
Error

## 2014-10-21 ENCOUNTER — Telehealth: Payer: Self-pay | Admitting: Hematology and Oncology

## 2014-10-21 NOTE — Telephone Encounter (Signed)
Patient called to cancel her October appointment and did not wish to reschedule  Claudia Stein

## 2015-02-13 ENCOUNTER — Ambulatory Visit: Payer: Medicare HMO | Admitting: Hematology and Oncology

## 2019-12-29 ENCOUNTER — Inpatient Hospital Stay (HOSPITAL_COMMUNITY)
Admission: EM | Admit: 2019-12-29 | Discharge: 2020-01-03 | DRG: 640 | Disposition: A | Discharge status: Home Health | Payer: Medicare HMO | Attending: Internal Medicine | Admitting: Internal Medicine

## 2019-12-29 ENCOUNTER — Emergency Department (HOSPITAL_COMMUNITY): Payer: Medicare HMO

## 2019-12-29 ENCOUNTER — Encounter (HOSPITAL_COMMUNITY): Payer: Self-pay

## 2019-12-29 DIAGNOSIS — I10 Essential (primary) hypertension: Secondary | ICD-10-CM | POA: Diagnosis present

## 2019-12-29 DIAGNOSIS — N390 Urinary tract infection, site not specified: Secondary | ICD-10-CM | POA: Diagnosis present

## 2019-12-29 DIAGNOSIS — N3000 Acute cystitis without hematuria: Secondary | ICD-10-CM | POA: Diagnosis present

## 2019-12-29 DIAGNOSIS — Z79890 Hormone replacement therapy (postmenopausal): Secondary | ICD-10-CM

## 2019-12-29 DIAGNOSIS — D696 Thrombocytopenia, unspecified: Secondary | ICD-10-CM | POA: Diagnosis present

## 2019-12-29 DIAGNOSIS — E860 Dehydration: Secondary | ICD-10-CM | POA: Diagnosis not present

## 2019-12-29 DIAGNOSIS — Z800 Family history of malignant neoplasm of digestive organs: Secondary | ICD-10-CM

## 2019-12-29 DIAGNOSIS — Y92009 Unspecified place in unspecified non-institutional (private) residence as the place of occurrence of the external cause: Secondary | ICD-10-CM

## 2019-12-29 DIAGNOSIS — E785 Hyperlipidemia, unspecified: Secondary | ICD-10-CM | POA: Diagnosis present

## 2019-12-29 DIAGNOSIS — E039 Hypothyroidism, unspecified: Secondary | ICD-10-CM | POA: Diagnosis present

## 2019-12-29 DIAGNOSIS — E43 Unspecified severe protein-calorie malnutrition: Secondary | ICD-10-CM | POA: Diagnosis present

## 2019-12-29 DIAGNOSIS — Z79899 Other long term (current) drug therapy: Secondary | ICD-10-CM

## 2019-12-29 DIAGNOSIS — Z9012 Acquired absence of left breast and nipple: Secondary | ICD-10-CM

## 2019-12-29 DIAGNOSIS — R627 Adult failure to thrive: Secondary | ICD-10-CM | POA: Diagnosis present

## 2019-12-29 DIAGNOSIS — U071 COVID-19: Secondary | ICD-10-CM | POA: Diagnosis present

## 2019-12-29 DIAGNOSIS — R0602 Shortness of breath: Secondary | ICD-10-CM

## 2019-12-29 DIAGNOSIS — Z6820 Body mass index (BMI) 20.0-20.9, adult: Secondary | ICD-10-CM

## 2019-12-29 DIAGNOSIS — W109XXA Fall (on) (from) unspecified stairs and steps, initial encounter: Secondary | ICD-10-CM | POA: Diagnosis present

## 2019-12-29 DIAGNOSIS — Z853 Personal history of malignant neoplasm of breast: Secondary | ICD-10-CM

## 2019-12-29 DIAGNOSIS — Z66 Do not resuscitate: Secondary | ICD-10-CM | POA: Diagnosis present

## 2019-12-29 DIAGNOSIS — Z8249 Family history of ischemic heart disease and other diseases of the circulatory system: Secondary | ICD-10-CM

## 2019-12-29 DIAGNOSIS — S0101XA Laceration without foreign body of scalp, initial encounter: Secondary | ICD-10-CM | POA: Diagnosis present

## 2019-12-29 DIAGNOSIS — E871 Hypo-osmolality and hyponatremia: Secondary | ICD-10-CM | POA: Diagnosis not present

## 2019-12-29 DIAGNOSIS — Z23 Encounter for immunization: Secondary | ICD-10-CM

## 2019-12-29 LAB — BASIC METABOLIC PANEL
Anion gap: 12 (ref 5–15)
BUN: 10 mg/dL (ref 8–23)
CO2: 26 mmol/L (ref 22–32)
Calcium: 8.5 mg/dL — ABNORMAL LOW (ref 8.9–10.3)
Chloride: 79 mmol/L — ABNORMAL LOW (ref 98–111)
Creatinine, Ser: 0.74 mg/dL (ref 0.44–1.00)
GFR calc Af Amer: >60 mL/min (ref >60)
GFR calc non Af Amer: >60 mL/min (ref >60)
Glucose, Bld: 121 mg/dL — ABNORMAL HIGH (ref 70–99)
Potassium: 3.6 mmol/L (ref 3.5–5.1)
Sodium: 117 mmol/L — CL (ref 135–145)

## 2019-12-29 LAB — URINALYSIS, ROUTINE W REFLEX MICROSCOPIC
Bilirubin Urine: NEGATIVE
Glucose, UA: NEGATIVE mg/dL
Ketones, ur: 5 mg/dL — AB
Nitrite: NEGATIVE
Protein, ur: >=300 mg/dL — AB
Specific Gravity, Urine: 1.02 (ref 1.005–1.030)
pH: 5 (ref 5.0–8.0)

## 2019-12-29 LAB — CBC
HCT: 39.2 % (ref 36.0–46.0)
Hemoglobin: 13.8 g/dL (ref 12.0–15.0)
MCH: 29 pg (ref 26.0–34.0)
MCHC: 35.2 g/dL (ref 30.0–36.0)
MCV: 82.4 fL (ref 80.0–100.0)
Platelets: 130 10*3/uL — ABNORMAL LOW (ref 150–400)
RBC: 4.76 MIL/uL (ref 3.87–5.11)
RDW: 12 % (ref 11.5–15.5)
WBC: 4.3 10*3/uL (ref 4.0–10.5)
nRBC: 0 % (ref 0.0–0.2)

## 2019-12-29 MED ORDER — SODIUM CHLORIDE 0.9 % IV SOLN
1.0000 g | Freq: Once | INTRAVENOUS | Status: AC
  Administered 2019-12-30: 1 g via INTRAVENOUS
  Filled 2019-12-29: qty 10

## 2019-12-29 MED ORDER — TETANUS-DIPHTH-ACELL PERTUSSIS 5-2.5-18.5 LF-MCG/0.5 IM SUSP
0.5000 mL | Freq: Once | INTRAMUSCULAR | Status: AC
  Administered 2019-12-30: 0.5 mL via INTRAMUSCULAR
  Filled 2019-12-29: qty 0.5

## 2019-12-29 MED ORDER — SODIUM CHLORIDE 0.9 % IV BOLUS
1000.0000 mL | Freq: Once | INTRAVENOUS | Status: AC
  Administered 2019-12-30: 1000 mL via INTRAVENOUS

## 2019-12-29 NOTE — ED Provider Notes (Signed)
[facility] EMERGENCY DEPARTMENT Provider Note   CSN: [id number] Arrival date & time: 12/29/19  1638     History Chief Complaint  Patient presents with  . Fall  . Dizziness    Claudia Stein is a 86 y.o. female.  HPI     This is an 86 year old female with a history of breast cancer, hypertension, hyperlipidemia who presents with a fall.  Patient reports that she has felt generally weak over the last week or so.  She was walking up the steps when she lost her balance and fell backwards striking the left side of her head.  She denies loss of consciousness.  She denies dizziness.  She denies any recent illnesses.  Family at bedside states that she lost her husband several months ago and since that time has had intermittent worsening generalized weakness.  She also reports decreased appetite and states in general "I just have not been hungry."  Denies any urinary symptoms including dysuria or hematuria.  No fevers.  No known sick contacts or Covid exposures.  She is not with vaccinated.  She is not on any blood thinners except for aspirin.  Past Medical History:  Diagnosis Date  . Bruises easily   . Cancer ([facility]) 04-16-09, 2012   Breast- bilateral  . Hyperlipidemia   . Hypertension   . PONV (postoperative nausea and vomiting)   . Thyroid disease     Patient Active Problem List   Diagnosis Date Noted  . History of breast cancer, left, left mastectomy - 03/22/2011.  T1a, N0. 04/08/2011  . 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 03/04/2011    Past Surgical History:  Procedure Laterality Date  . APPENDECTOMY    . BREAST SURGERY  04-16-09   left  Lumpectomy  . FRACTURE SURGERY     broke right ankle in the 80's  . MASTECTOMY  November 2011   right breast  . MASTECTOMY W/ SENTINEL NODE BIOPSY  03/22/2011   Procedure: MASTECTOMY WITH SENTINEL LYMPH NODE BIOPSY;  Surgeon: Shann Medal, MD;  Location: [facility];  Service: General;  Laterality: Left;  left  mastectomy, sential lymph node biopsy, left axilla     OB History   No obstetric history on file.     Family History  Problem Relation Age of Onset  . Heart disease Mother 63  . Cancer Paternal Aunt        colon    Social History   Tobacco Use  . Smoking status: Never Smoker  . Smokeless tobacco: Never Used  Substance Use Topics  . Alcohol use: No  . Drug use: No    Home Medications Prior to Admission medications   Medication Sig Start Date End Date Taking? Authorizing Provider  Calcium Carbonate-Vitamin D (CALCIUM PLUS VITAMIN D PO) Take 1 tablet by mouth daily.      [provider]  hydrochlorothiazide 25 MG tablet Take 25 mg by mouth daily.      [provider]  ketoconazole (NIZORAL) 2 % cream Apply 1 application topically daily. 02/12/14   Gardenia Phlegm, NP  levothyroxine (SYNTHROID, LEVOTHROID) 50 MCG tablet Take 50 mcg by mouth daily.     [provider]  losartan (COZAAR) 50 MG tablet Take 50 mg by mouth daily. 06/04/11- Pt states she cuts a 100mg  tablet in half.    [provider]  Multiple Vitamins-Minerals (PRESERVISION AREDS 2 PO) Take 2 tablets by mouth daily.      [provider]  simvastatin (ZOCOR) 20 MG tablet Take 20 mg by mouth daily.      [provider]  simvastatin (ZOCOR) 5 MG tablet Take 5 mg by mouth at bedtime. TAKING A 20 MG AND AN 5 MG TABLET TOGETHER TO = 25MG      [provider]  vitamin B-12 (CYANOCOBALAMIN) 1000 MCG tablet Take 1,000 mcg by mouth daily.      [provider]  vitamin C (ASCORBIC ACID) 500 MG tablet Take 500 mg by mouth daily.      [provider]    Allergies    Patient has no known allergies.  Review of Systems   Review of Systems  Constitutional: Positive for appetite change and fatigue. Negative for fever.  Respiratory: Negative for shortness of breath.   Cardiovascular: Negative for chest pain and leg swelling.  Gastrointestinal:  Negative for abdominal pain, nausea and vomiting.  Genitourinary: Negative for dysuria.  Skin: Positive for wound.  Neurological: Negative for dizziness and weakness.  All other systems reviewed and are negative.   Physical Exam Updated Vital Signs BP (!) 158/85 (BP Location: Right Arm)   Pulse 63   Temp 98.4 F (36.9 C) (Oral)   Resp 18   Ht 1.575 m (5\' 2" )   Wt 58 kg   SpO2 98%   BMI 23.39 kg/m   Physical Exam Vitals and nursing note reviewed.  Constitutional:      Appearance: She is well-developed.  HENT:     Head: Normocephalic.     Comments: Hematoma with laceration to the left forehead, bleeding controlled    Nose: Nose normal.     Mouth/Throat:     Mouth: Mucous membranes are moist.  Eyes:     Extraocular Movements: Extraocular movements intact.     Pupils: Pupils are equal, round, and reactive to light.  Cardiovascular:     Rate and Rhythm: Normal rate and regular rhythm.     Heart sounds: Normal heart sounds.  Pulmonary:     Effort: Pulmonary effort is normal. No respiratory distress.     Breath sounds: No wheezing.  Abdominal:     General: Bowel sounds are normal.     Palpations: Abdomen is soft.     Tenderness: There is no abdominal tenderness. There is no guarding or rebound.  Musculoskeletal:     Cervical back: Neck supple.     Right lower leg: No edema.     Left lower leg: No edema.  Skin:    General: Skin is warm and dry.  Neurological:     Mental Status: She is alert and oriented to person, place, and time.     Comments: 5 out of 5 strength in all 4 extremities, gait testing deferred  Psychiatric:     Comments: Flat affect     ED Results / Procedures / Treatments   Labs (all labs ordered are listed, but only abnormal results are displayed) Labs Reviewed  BASIC METABOLIC PANEL - Abnormal; Notable for the following components:      Result Value   Sodium 117 (*)    Chloride 79 (*)    Glucose, Bld 121 (*)    Calcium 8.5 (*)    All other  components within normal limits  CBC - Abnormal; Notable for the following components:   Platelets 130 (*)    All other components within normal limits  URINALYSIS, ROUTINE W REFLEX MICROSCOPIC - Abnormal; Notable for the following components:   Color, Urine AMBER (*)  APPearance CLOUDY (*)    Hgb urine dipstick SMALL (*)    Ketones, ur 5 (*)    Protein, ur >=300 (*)    Leukocytes,Ua LARGE (*)    Bacteria, UA RARE (*)    All other components within normal limits  URINE CULTURE  SARS CORONAVIRUS 2 BY RT PCR (HOSPITAL ORDER, [facility])  CBG MONITORING, ED    EKG EKG Interpretation  Date/Time:  Saturday December 29 2019 16:42:05 EDT Ventricular Rate:  71 PR Interval:  124 QRS Duration: 166 QT Interval:  466 QTC Calculation: 506 R Axis:   -61 Text Interpretation: Sinus rhythm with occasional Premature ventricular complexes and Premature atrial complexes with ventricular escape complexes Left bundle branch block Abnormal ECG Confirmed by Thayer Jew [phone]) on 12/29/2019 11:28:59 PM   Radiology CT HEAD WO CONTRAST  Result Date: 12/29/2019 CLINICAL DATA:  Head trauma. Fall this afternoon. Struck left side of head. EXAM: CT HEAD WITHOUT CONTRAST TECHNIQUE: Contiguous axial images were obtained from the base of the skull through the vertex without intravenous contrast. COMPARISON:  None. FINDINGS: Brain: Age related atrophy. Moderate chronic small vessel ischemia. Remote lacunar infarct in the right basal ganglia. No intracranial hemorrhage, mass effect, or midline shift. No hydrocephalus. The basilar cisterns are patent. No evidence of territorial infarct or acute ischemia. No extra-axial or intracranial fluid collection. Vascular: Atherosclerosis of skullbase vasculature without hyperdense vessel or abnormal calcification. Skull: Left frontal scalp hematoma without subjacent fracture. No focal bone lesion. Sinuses/Orbits: Paranasal sinuses and mastoid air  cells are clear. The visualized orbits are unremarkable. Bilateral cataract resection. Other: None. IMPRESSION: 1. Left frontal scalp hematoma. No acute intracranial abnormality. No skull fracture. 2. Age related atrophy and chronic small vessel ischemia. Remote lacunar infarct in the right basal ganglia. Electronically Signed   By: Keith Rake M.D.   On: 12/29/2019 17:32    Procedures Procedures (including critical care time)  Medications Ordered in ED Medications  Tdap (BOOSTRIX) injection 0.5 mL (has no administration in time range)  cefTRIAXone (ROCEPHIN) 1 g in sodium chloride 0.9 % 100 mL IVPB (has no administration in time range)  sodium chloride 0.9 % bolus 1,000 mL (1,000 mLs Intravenous New Bag/Given 12/30/19 0005)    ED Course  I have reviewed the triage vital signs and the nursing notes.  Pertinent labs & imaging results that were available during my care of the patient were reviewed by me and considered in my medical decision making (see chart for details).    MDM Rules/Calculators/A&P                           Patient presents with fall.  She is overall nontoxic and vital signs are notable for blood pressure 158/85.  She has an injury to the left scalp.  She reports generalized weakness and appetite changes which have been intermittent since she lost her husband several months ago.  She is not otherwise complaining of any pain or illness.  Labs reviewed from triage.  Patient is significantly hyponatremic with a sodium of 117.  She is also hypochloremic and has some ketones in the urine suggestive of dehydration.  This would fit with decreased appetite.  Additionally, she has both bacteria and white cells in her urine.  We will send culture and give a dose of Rocephin.  UTI could cause some metabolic derangements.  She does not appear septic as she is not febrile and has  no leukocytosis.  Given how significantly hyponatremic she is, she will need admission.  This time she  appears volume down so we will give her a liter of fluids and work on hospital admission.  Covid testing is pending.  Laceration repaired by PA.  See additional note for details.    Final Clinical Impression(s) / ED Diagnoses Final diagnoses:  Hyponatremia  Dehydration  Laceration of scalp, initial encounter  Acute cystitis without hematuria    Rx / DC Orders ED Discharge Orders    None       Dina Rich, Barbette Hair, MD 12/30/19 [phone]

## 2019-12-29 NOTE — ED Triage Notes (Signed)
Pt arrives POV for eval of head laceration after falling this afternoon. Pt lost balance, fell struck L side of head. Denies LOC, No thinners. Pt reports progressive weakness throughout this week

## 2019-12-30 ENCOUNTER — Other Ambulatory Visit: Payer: Self-pay

## 2019-12-30 ENCOUNTER — Inpatient Hospital Stay (HOSPITAL_COMMUNITY): Payer: Medicare HMO

## 2019-12-30 DIAGNOSIS — Z79890 Hormone replacement therapy (postmenopausal): Secondary | ICD-10-CM | POA: Diagnosis not present

## 2019-12-30 DIAGNOSIS — W19XXXA Unspecified fall, initial encounter: Secondary | ICD-10-CM

## 2019-12-30 DIAGNOSIS — S0101XA Laceration without foreign body of scalp, initial encounter: Secondary | ICD-10-CM | POA: Diagnosis present

## 2019-12-30 DIAGNOSIS — I10 Essential (primary) hypertension: Secondary | ICD-10-CM | POA: Diagnosis present

## 2019-12-30 DIAGNOSIS — Z6820 Body mass index (BMI) 20.0-20.9, adult: Secondary | ICD-10-CM | POA: Diagnosis not present

## 2019-12-30 DIAGNOSIS — E43 Unspecified severe protein-calorie malnutrition: Secondary | ICD-10-CM | POA: Diagnosis present

## 2019-12-30 DIAGNOSIS — Z8249 Family history of ischemic heart disease and other diseases of the circulatory system: Secondary | ICD-10-CM | POA: Diagnosis not present

## 2019-12-30 DIAGNOSIS — E785 Hyperlipidemia, unspecified: Secondary | ICD-10-CM | POA: Diagnosis present

## 2019-12-30 DIAGNOSIS — U071 COVID-19: Secondary | ICD-10-CM | POA: Diagnosis present

## 2019-12-30 DIAGNOSIS — Z79899 Other long term (current) drug therapy: Secondary | ICD-10-CM | POA: Diagnosis not present

## 2019-12-30 DIAGNOSIS — Y92009 Unspecified place in unspecified non-institutional (private) residence as the place of occurrence of the external cause: Secondary | ICD-10-CM

## 2019-12-30 DIAGNOSIS — Z853 Personal history of malignant neoplasm of breast: Secondary | ICD-10-CM | POA: Diagnosis not present

## 2019-12-30 DIAGNOSIS — N3000 Acute cystitis without hematuria: Secondary | ICD-10-CM | POA: Diagnosis present

## 2019-12-30 DIAGNOSIS — E039 Hypothyroidism, unspecified: Secondary | ICD-10-CM | POA: Diagnosis present

## 2019-12-30 DIAGNOSIS — D696 Thrombocytopenia, unspecified: Secondary | ICD-10-CM | POA: Diagnosis present

## 2019-12-30 DIAGNOSIS — Z9012 Acquired absence of left breast and nipple: Secondary | ICD-10-CM | POA: Diagnosis not present

## 2019-12-30 DIAGNOSIS — R627 Adult failure to thrive: Secondary | ICD-10-CM | POA: Diagnosis present

## 2019-12-30 DIAGNOSIS — Z800 Family history of malignant neoplasm of digestive organs: Secondary | ICD-10-CM | POA: Diagnosis not present

## 2019-12-30 DIAGNOSIS — E871 Hypo-osmolality and hyponatremia: Secondary | ICD-10-CM | POA: Diagnosis present

## 2019-12-30 DIAGNOSIS — E860 Dehydration: Secondary | ICD-10-CM | POA: Diagnosis present

## 2019-12-30 DIAGNOSIS — N390 Urinary tract infection, site not specified: Secondary | ICD-10-CM | POA: Diagnosis present

## 2019-12-30 DIAGNOSIS — Z23 Encounter for immunization: Secondary | ICD-10-CM | POA: Diagnosis present

## 2019-12-30 DIAGNOSIS — W109XXA Fall (on) (from) unspecified stairs and steps, initial encounter: Secondary | ICD-10-CM | POA: Diagnosis present

## 2019-12-30 DIAGNOSIS — Z66 Do not resuscitate: Secondary | ICD-10-CM | POA: Diagnosis present

## 2019-12-30 LAB — URINALYSIS, ROUTINE W REFLEX MICROSCOPIC
Bacteria, UA: NONE SEEN
Bilirubin Urine: NEGATIVE
Glucose, UA: NEGATIVE mg/dL
Hgb urine dipstick: NEGATIVE
Ketones, ur: 5 mg/dL — AB
Nitrite: POSITIVE — AB
Protein, ur: 30 mg/dL — AB
Specific Gravity, Urine: 1.009 (ref 1.005–1.030)
pH: 7 (ref 5.0–8.0)

## 2019-12-30 LAB — SARS CORONAVIRUS 2 BY RT PCR (HOSPITAL ORDER, PERFORMED IN ~~LOC~~ HOSPITAL LAB): SARS Coronavirus 2: POSITIVE — AB

## 2019-12-30 LAB — COMPREHENSIVE METABOLIC PANEL
ALT: 29 U/L (ref 0–44)
AST: 45 U/L — ABNORMAL HIGH (ref 15–41)
Albumin: 3.2 g/dL — ABNORMAL LOW (ref 3.5–5.0)
Alkaline Phosphatase: 72 U/L (ref 38–126)
Anion gap: 11 (ref 5–15)
BUN: 7 mg/dL — ABNORMAL LOW (ref 8–23)
CO2: 26 mmol/L (ref 22–32)
Calcium: 8.1 mg/dL — ABNORMAL LOW (ref 8.9–10.3)
Chloride: 82 mmol/L — ABNORMAL LOW (ref 98–111)
Creatinine, Ser: 0.58 mg/dL (ref 0.44–1.00)
GFR calc Af Amer: >60 mL/min (ref >60)
GFR calc non Af Amer: >60 mL/min (ref >60)
Glucose, Bld: 86 mg/dL (ref 70–99)
Potassium: 3.1 mmol/L — ABNORMAL LOW (ref 3.5–5.1)
Sodium: 119 mmol/L — CL (ref 135–145)
Total Bilirubin: 0.7 mg/dL (ref 0.3–1.2)
Total Protein: 6.2 g/dL — ABNORMAL LOW (ref 6.5–8.1)

## 2019-12-30 LAB — CBC
HCT: 39.5 % (ref 36.0–46.0)
Hemoglobin: 13.8 g/dL (ref 12.0–15.0)
MCH: 28.8 pg (ref 26.0–34.0)
MCHC: 34.9 g/dL (ref 30.0–36.0)
MCV: 82.3 fL (ref 80.0–100.0)
Platelets: 114 10*3/uL — ABNORMAL LOW (ref 150–400)
RBC: 4.8 MIL/uL (ref 3.87–5.11)
RDW: 12.2 % (ref 11.5–15.5)
WBC: 4.6 10*3/uL (ref 4.0–10.5)
nRBC: 0 % (ref 0.0–0.2)

## 2019-12-30 LAB — SODIUM, URINE, RANDOM: Sodium, Ur: 132 mmol/L

## 2019-12-30 LAB — URIC ACID: Uric Acid, Serum: 2.8 mg/dL (ref 2.5–7.1)

## 2019-12-30 LAB — OSMOLALITY, URINE
Osmolality, Ur: 325 mOsm/kg (ref 300–900)
Osmolality, Ur: 381 mOsm/kg (ref 300–900)

## 2019-12-30 LAB — FERRITIN: Ferritin: 500 ng/mL — ABNORMAL HIGH (ref 11–307)

## 2019-12-30 LAB — MAGNESIUM: Magnesium: 1 mg/dL — ABNORMAL LOW (ref 1.7–2.4)

## 2019-12-30 LAB — BRAIN NATRIURETIC PEPTIDE: B Natriuretic Peptide: 345 pg/mL — ABNORMAL HIGH (ref 0.0–100.0)

## 2019-12-30 LAB — TSH: TSH: 4.688 u[IU]/mL — ABNORMAL HIGH (ref 0.350–4.500)

## 2019-12-30 LAB — C-REACTIVE PROTEIN
CRP: 0.5 mg/dL (ref <1.0)
CRP: 0.5 mg/dL (ref <1.0)

## 2019-12-30 LAB — ABO/RH: ABO/RH(D): O POS

## 2019-12-30 LAB — D-DIMER, QUANTITATIVE: D-Dimer, Quant: 0.52 ug/mL-FEU — ABNORMAL HIGH (ref 0.00–0.50)

## 2019-12-30 LAB — OSMOLALITY: Osmolality: 249 mOsm/kg — CL (ref 275–295)

## 2019-12-30 LAB — LACTATE DEHYDROGENASE: LDH: 193 U/L — ABNORMAL HIGH (ref 98–192)

## 2019-12-30 MED ORDER — VITAMIN D 25 MCG (1000 UNIT) PO TABS
1000.0000 [IU] | ORAL_TABLET | Freq: Every day | ORAL | Status: DC
  Administered 2019-12-30 – 2020-01-03 (×5): 1000 [IU] via ORAL
  Filled 2019-12-30 (×5): qty 1

## 2019-12-30 MED ORDER — AMLODIPINE BESYLATE 5 MG PO TABS: 2.5000 mg | ORAL_TABLET | Freq: Every day | ORAL | Status: DC

## 2019-12-30 MED ORDER — LACTATED RINGERS IV SOLN: INTRAVENOUS | Status: DC

## 2019-12-30 MED ORDER — HEPARIN SODIUM (PORCINE) 5000 UNIT/ML IJ SOLN
5000.0000 [IU] | Freq: Three times a day (TID) | INTRAMUSCULAR | Status: DC
  Administered 2019-12-30: 5000 [IU] via SUBCUTANEOUS
  Filled 2019-12-30: qty 1

## 2019-12-30 MED ORDER — EPINEPHRINE 0.3 MG/0.3ML IJ SOAJ
0.3000 mg | Freq: Once | INTRAMUSCULAR | Status: DC | PRN
  Filled 2019-12-30: qty 0.6

## 2019-12-30 MED ORDER — LOSARTAN POTASSIUM 50 MG PO TABS: 25.0000 mg | ORAL_TABLET | Freq: Every day | ORAL | Status: DC

## 2019-12-30 MED ORDER — ACETAMINOPHEN 325 MG PO TABS: 650.0000 mg | ORAL_TABLET | Freq: Four times a day (QID) | ORAL | Status: DC | PRN

## 2019-12-30 MED ORDER — SODIUM CHLORIDE 0.9 % IV SOLN
1200.0000 mg | Freq: Once | INTRAVENOUS | Status: AC
  Administered 2019-12-30: 1200 mg via INTRAVENOUS
  Filled 2019-12-30 (×2): qty 10

## 2019-12-30 MED ORDER — DEXTROSE-NACL 5-0.9 % IV SOLN: INTRAVENOUS | Status: DC

## 2019-12-30 MED ORDER — VITAMIN B-12 1000 MCG PO TABS
1000.0000 ug | ORAL_TABLET | Freq: Every day | ORAL | Status: DC
  Administered 2019-12-30 – 2020-01-03 (×5): 1000 ug via ORAL
  Filled 2019-12-30 (×5): qty 1

## 2019-12-30 MED ORDER — CALCIUM CARBONATE-VITAMIN D 500-200 MG-UNIT PO TABS
1.0000 | ORAL_TABLET | Freq: Every day | ORAL | Status: DC
  Administered 2019-12-30 – 2020-01-03 (×5): 1 via ORAL
  Filled 2019-12-30 (×5): qty 1

## 2019-12-30 MED ORDER — POTASSIUM CHLORIDE CRYS ER 20 MEQ PO TBCR
20.0000 meq | EXTENDED_RELEASE_TABLET | Freq: Once | ORAL | Status: AC
  Administered 2019-12-30: 20 meq via ORAL
  Filled 2019-12-30: qty 1

## 2019-12-30 MED ORDER — SODIUM CHLORIDE 0.9 % IV SOLN: INTRAVENOUS | Status: DC | PRN

## 2019-12-30 MED ORDER — ONDANSETRON HCL 4 MG PO TABS: 4.0000 mg | ORAL_TABLET | Freq: Four times a day (QID) | ORAL | Status: DC | PRN

## 2019-12-30 MED ORDER — METHYLPREDNISOLONE SODIUM SUCC 125 MG IJ SOLR: 125.0000 mg | Freq: Once | INTRAMUSCULAR | Status: DC | PRN

## 2019-12-30 MED ORDER — LEVOTHYROXINE SODIUM 50 MCG PO TABS
50.0000 ug | ORAL_TABLET | Freq: Every day | ORAL | Status: DC
  Administered 2019-12-30 – 2020-01-03 (×5): 50 ug via ORAL
  Filled 2019-12-30 (×5): qty 1

## 2019-12-30 MED ORDER — POTASSIUM CHLORIDE 10 MEQ/100ML IV SOLN
10.0000 meq | INTRAVENOUS | Status: AC
  Administered 2019-12-30 (×2): 10 meq via INTRAVENOUS
  Filled 2019-12-30 (×2): qty 100

## 2019-12-30 MED ORDER — ACETAMINOPHEN 650 MG RE SUPP: 650.0000 mg | Freq: Four times a day (QID) | RECTAL | Status: DC | PRN

## 2019-12-30 MED ORDER — AMLODIPINE BESYLATE 10 MG PO TABS
10.0000 mg | ORAL_TABLET | Freq: Every day | ORAL | Status: DC
  Administered 2019-12-30 – 2020-01-03 (×5): 10 mg via ORAL
  Filled 2019-12-30 (×4): qty 1
  Filled 2019-12-30: qty 2

## 2019-12-30 MED ORDER — BOOST / RESOURCE BREEZE PO LIQD CUSTOM
1.0000 | Freq: Three times a day (TID) | ORAL | Status: DC
  Administered 2019-12-30 – 2020-01-03 (×12): 1 via ORAL
  Filled 2019-12-30 (×2): qty 1

## 2019-12-30 MED ORDER — DIPHENHYDRAMINE HCL 50 MG/ML IJ SOLN: 50.0000 mg | Freq: Once | INTRAMUSCULAR | Status: DC | PRN

## 2019-12-30 MED ORDER — ASCORBIC ACID 500 MG PO TABS
500.0000 mg | ORAL_TABLET | Freq: Every day | ORAL | Status: DC
  Administered 2019-12-30 – 2020-01-03 (×5): 500 mg via ORAL
  Filled 2019-12-30 (×5): qty 1

## 2019-12-30 MED ORDER — ONDANSETRON HCL 4 MG/2ML IJ SOLN: 4.0000 mg | Freq: Four times a day (QID) | INTRAMUSCULAR | Status: DC | PRN

## 2019-12-30 MED ORDER — SODIUM CHLORIDE 0.9 % IV SOLN
1.0000 g | Freq: Every day | INTRAVENOUS | Status: DC
  Administered 2019-12-30 – 2020-01-01 (×3): 1 g via INTRAVENOUS
  Filled 2019-12-30 (×3): qty 10

## 2019-12-30 MED ORDER — ALBUTEROL SULFATE HFA 108 (90 BASE) MCG/ACT IN AERS
2.0000 | INHALATION_SPRAY | Freq: Once | RESPIRATORY_TRACT | Status: DC | PRN
  Filled 2019-12-30: qty 6.7

## 2019-12-30 MED ORDER — LATANOPROST 0.005 % OP SOLN
1.0000 [drp] | Freq: Every day | OPHTHALMIC | Status: DC
  Administered 2019-12-30 – 2020-01-02 (×4): 1 [drp] via OPHTHALMIC
  Filled 2019-12-30 (×2): qty 2.5

## 2019-12-30 MED ORDER — FAMOTIDINE IN NACL 20-0.9 MG/50ML-% IV SOLN
20.0000 mg | Freq: Once | INTRAVENOUS | Status: DC | PRN
  Filled 2019-12-30: qty 50

## 2019-12-30 MED ORDER — SIMVASTATIN 5 MG PO TABS
5.0000 mg | ORAL_TABLET | Freq: Every day | ORAL | Status: DC
  Administered 2019-12-30 – 2020-01-02 (×4): 5 mg via ORAL
  Filled 2019-12-30 (×6): qty 1

## 2019-12-30 MED ORDER — CARVEDILOL 3.125 MG PO TABS
3.1250 mg | ORAL_TABLET | Freq: Two times a day (BID) | ORAL | Status: DC
  Administered 2019-12-30 – 2020-01-03 (×6): 3.125 mg via ORAL
  Filled 2019-12-30 (×9): qty 1

## 2019-12-30 MED ORDER — HEPARIN SODIUM (PORCINE) 5000 UNIT/ML IJ SOLN
5000.0000 [IU] | Freq: Three times a day (TID) | INTRAMUSCULAR | Status: DC
  Administered 2019-12-31 – 2020-01-03 (×11): 5000 [IU] via SUBCUTANEOUS
  Filled 2019-12-30 (×11): qty 1

## 2019-12-30 NOTE — ED Provider Notes (Signed)
LACERATION REPAIR Performed by: Dewaine Oats Authorized by: Dewaine Oats Consent: Verbal consent obtained. Risks and benefits: risks, benefits and alternatives were discussed Consent given by: patient Patient identity confirmed: provided demographic data Prepped and Draped in normal sterile fashion Wound explored  Laceration Location: left frontal scalp  Laceration Length: 5 cm  No Foreign Bodies seen or palpated  Anesthesia: local infiltration  Local anesthetic: lidocaine 2% w/epinephrine  Anesthetic total: 3 ml  Irrigation method: syringe Amount of cleaning: extensive clot, active bleeding  Skin closure: staples, 3-0 ethilon sutures  Number of sutures: 6 staples, 2 sutures for hemostasis  Technique: staples, simple interrupted  Patient tolerance: Patient tolerated the procedure well with no immediate complications.    Charlann Lange, PA-C 12/30/19 0110    Merryl Hacker, MD 12/30/19 [phone]

## 2019-12-30 NOTE — Evaluation (Signed)
Physical Therapy Evaluation Patient Details Name: Claudia Stein MRN: [id number] DOB: February 28, 1934 Today's Date: 12/30/2019   History of Present Illness  Claudia Stein is a 86 y.o. female with medical history significant of breast cancer status post surgery and treatment, hypothyroidism, hypertension, hyperlipidemia, who has recently been getting weaker at home not eating or drinking adequately has also been withdrawing and failing to thrive.  Patient apparently was going up steps today missed a step and fell hitting her head against a stool.  She sustained a large laceration on the lateral part of her scalp with significant bleeding.   Clinical Impression   Pt admitted with above diagnosis. Comes from home where she lives with her son and grandchild; They provide assist prn, both work nights; Lives in a single level home with a few steps to enter; Typically uses a cane to ambulate; Presents to PT with decr balance, generalized weakness, and a recent fall;  Pt currently with functional limitations due to the deficits listed below (see PT Problem List). Pt will benefit from skilled PT to increase their independence and safety with mobility to allow discharge to the venue listed below.       Follow Up Recommendations Home health PT (if slow progress, must consider SNF)    Equipment Recommendations  Rolling walker with 5" wheels;3in1 (PT)    Recommendations for Other Services OT consult (will order per protocol)     Precautions / Restrictions Precautions Precautions: Fall      Mobility  Bed Mobility Overal bed mobility: Needs Assistance Bed Mobility: Supine to Sit     Supine to sit: Min assist;Mod assist     General bed mobility comments: min handheld assist to pull to sit; Light mod assist to scoot to EOB  Transfers Overall transfer level: Needs assistance Equipment used: 1 person hand held assist Transfers: Sit to/from Stand Sit to Stand: Mod assist         General transfer  comment: Light mod assist to power up; notable dependence on bracing backs of legs against bed for stability  Ambulation/Gait Ambulation/Gait assistance: Mod assist Gait Distance (Feet):  (Pivotal steps bed to chair) Assistive device: 1 person hand held assist (bil shelf-arm technique) Gait Pattern/deviations: Shuffle     General Gait Details: Unsteady, requiring bil UE support; pivotal steps bed to recliner  Stairs            Wheelchair Mobility    Modified Rankin (Stroke Patients Only)       Balance Overall balance assessment: Needs assistance   Sitting balance-Leahy Scale: Fair       Standing balance-Leahy Scale: Poor                               Pertinent Vitals/Pain Pain Assessment: No/denies pain    Home Living Family/patient expects to be discharged to:: Private residence Living Arrangements: Other relatives Available Help at Discharge: Family;Available PRN/intermittently Type of Home: House Home Access: Stairs to enter   [employer] of Steps: 1 (at kitchen entrance) Home Layout: One level Home Equipment: Cane - single point      Prior Function Level of Independence: Independent;Independent with assistive device(s)         Comments: uses a cane for amb most of the time     Hand Dominance        Extremity/Trunk Assessment   Upper Extremity Assessment Upper Extremity Assessment: Generalized weakness    Lower  Extremity Assessment Lower Extremity Assessment: Generalized weakness    Cervical / Trunk Assessment Cervical / Trunk Assessment: Other exceptions Cervical / Trunk Exceptions: appears scoliotic  Communication   Communication: No difficulties  Cognition Arousal/Alertness: Awake/alert Behavior During Therapy: WFL for tasks assessed/performed Overall Cognitive Status: Within Functional Limits for tasks assessed                                        General Comments General comments  (skin integrity, edema, etc.): O2 sats 98%, HR 78; BP post transfer 161/81    Exercises     Assessment/Plan    PT Assessment Patient needs continued PT services  PT Problem List Decreased strength;Decreased activity tolerance;Decreased balance;Decreased mobility;Decreased knowledge of use of DME;Decreased safety awareness       PT Treatment Interventions DME instruction;Gait training;Stair training;Functional mobility training;Therapeutic activities;Therapeutic exercise;Balance training;Patient/family education;Neuromuscular re-education    PT Goals (Current goals can be found in the Care Plan section)  Acute Rehab PT Goals Patient Stated Goal: Hopes to go home PT Goal Formulation: With patient    Frequency Min 3X/week   Barriers to discharge Decreased caregiver support Must be modified independent to dc home; is home alone during the day    Co-evaluation               AM-PAC PT "6 Clicks" Mobility  Outcome Measure Help needed turning from your back to your side while in a flat bed without using bedrails?: None Help needed moving from lying on your back to sitting on the side of a flat bed without using bedrails?: A Little Help needed moving to and from a bed to a chair (including a wheelchair)?: A Little Help needed standing up from a chair using your arms (e.g., wheelchair or bedside chair)?: A Little Help needed to walk in hospital room?: A Lot Help needed climbing 3-5 steps with a railing? : A Lot 6 Click Score: 17    End of Session   Activity Tolerance: Patient tolerated treatment well Patient left: in chair;with call bell/phone within reach Nurse Communication: Mobility status PT Visit Diagnosis: Other abnormalities of gait and mobility (R26.89)    Time: 1356-1420 PT Time Calculation (min) (ACUTE ONLY): 24 min   Charges:   PT Evaluation $PT Eval Moderate Complexity: 1 Mod PT Treatments $Therapeutic Activity: 8-22 mins        Roney Marion, PT   Acute Rehabilitation Services Pager [phone] Office [place] 12/30/2019, 5:39 PM

## 2019-12-30 NOTE — ED Notes (Signed)
Date and time results received: 12/30/19 0629 (use smartphrase ".now" to insert current time)  Test: Sodium Critical Value: 119  Name of Provider Notified: MD Opyd T.  Orders Received? Or Actions Taken?: Awaiting orders, actions

## 2019-12-30 NOTE — ED Notes (Signed)
Pt was able to provide phone number for son, Minerva Fester.  Called and updated son and gave him phone number to call for pt's room/nurse.

## 2019-12-30 NOTE — ED Notes (Signed)
Claudia Stein) Contact Information: [phone]

## 2019-12-30 NOTE — ED Notes (Signed)
Admitting MD to bedside to speak with pt.

## 2019-12-30 NOTE — ED Notes (Addendum)
Date and time results received: 12/30/19 0613  Test: Serum osmolality  Critical Value: 246  Name of Provider Notified: MD T. Opyd  Orders Received? Or Actions Taken?: Awaiting orders/actions

## 2019-12-30 NOTE — ED Notes (Signed)
Breakfast tray ordered 

## 2019-12-30 NOTE — Progress Notes (Signed)
PROGRESS NOTE                                                                                                                                                                                                             Patient Demographics:    Claudia Stein, is a 86 y.o. female, DOB - Mar 28, 1934, MBT:[id number]  Outpatient Primary MD for the patient is Tamsen Roers, MD    LOS - 0  Admit date - 12/29/2019    Chief Complaint  Patient presents with  . Fall  . Dizziness       Brief Narrative - Claudia Stein is a 86 y.o. female with medical history significant of breast cancer status post surgery and treatment, hypothyroidism, hypertension, hyperlipidemia, who has recently been getting weaker at home not eating or drinking adequately has also been withdrawing and failing to thrive, she sustained a fall going up the stairs of her home incurred left scalp laceration and was brought to the ER where the laceration was sutured.  She was found to be incidental positive for COVID-19 infection and also had evidence of severe hyponatremia.  She is not vaccinated for Covid.   Subjective:    Claudia Stein today has, No headache, No chest pain, No abdominal pain - No Nausea, No new weakness tingling or numbness, no Cough - no SOB.     Assessment  & Plan :      1. Incidental Covid positive status -no cough or shortness of breath, CRP stable, she has received Regen - Cov antibody cocktail.  Will obtain baseline chest x-ray and monitor.  Encouraged the patient to sit up in chair in the daytime use I-S and flutter valve for pulmonary toiletry and then prone in bed when at night.  Will advance activity and titrate down oxygen as possible.  SpO2: 96 %  Recent Labs  Lab 12/29/19 1713 12/30/19 0008 12/30/19 0449  WBC 4.3  --  4.6  PLT 130*  --  114*  CRP  --   --  0.5  AST  --   --  45*  ALT  --   --  29  ALKPHOS  --   --  72  BILITOT  --    --  0.7  ALBUMIN  --   --  3.2*  DDIMER  --   --  0.52*  SARSCOV2NAA  --  POSITIVE*  --     2.  Mechanical fall with left scalp laceration.  Laceration has been sutured in the ER, CT head nonacute, PT OT and monitor.  3.  Hyponatremia.  Could be due to dehydration, gentle LR and monitor.  Monitor electrolytes.  4.  Essential hypertension.  Placed on combination of Norvasc and Coreg.  5.  Dyslipidemia.  On statin continue.  6.  Severe protein calorie malnutrition and failure to thrive.  Protein supplements, PT OT.  7.   Hypothyroidism.  On Synthroid with stable TSH.  Lab Results  Component Value Date   TSH 4.688 (H) 12/30/2019     Condition -   Guarded  Family Communication  : Husband Inocente Salles [phone]  on 12/30/2019 at 9:31 AM message left  Code Status :  Full  Consults  : None  Procedures  :    CT Head.  Left scalp hematoma.  PUD Prophylaxis : None  Disposition Plan  :    Status is: Inpatient  Not inpatient appropriate, will call UM team and downgrade to OBS.   Dispo: The patient is from: Home              Anticipated d/c is to: SNF              Anticipated d/c date is: > 3 days              Patient currently is not medically stable to d/c.   DVT Prophylaxis  :  SCD and start heparin on 12/31/2019  Lab Results  Component Value Date   PLT 114 (L) 12/30/2019    Diet :  Diet Order            Diet regular Room service appropriate? Yes; Fluid consistency: Thin  Diet effective now                  Inpatient Medications  Scheduled Meds: . amLODipine  2.5 mg Oral Daily  . vitamin C  500 mg Oral Daily  . calcium-vitamin D  1 tablet Oral QAC supper  . cholecalciferol  1,000 Units Oral Daily  . heparin  5,000 Units Subcutaneous Q8H  . latanoprost  1 drop Both Eyes QHS  . levothyroxine  50 mcg Oral QAC breakfast  . losartan  25 mg Oral Daily  . simvastatin  5 mg Oral QHS  . vitamin B-12  1,000 mcg Oral Daily   Continuous Infusions: . sodium chloride     . cefTRIAXone (ROCEPHIN)  IV    . famotidine (PEPCID) IV    . lactated ringers    . potassium chloride 10 mEq (12/30/19 0836)   PRN Meds:.sodium chloride, acetaminophen **OR** [DISCONTINUED] acetaminophen, albuterol, diphenhydrAMINE, EPINEPHrine, famotidine (PEPCID) IV, [DISCONTINUED] ondansetron **OR** ondansetron (ZOFRAN) IV  Antibiotics  :    Anti-infectives (From admission, onward)   Start     Dose/Rate Route Frequency Ordered Stop   12/30/19 1600  cefTRIAXone (ROCEPHIN) 1 g in sodium chloride 0.9 % 100 mL IVPB        1 g 200 mL/hr over 30 Minutes Intravenous Daily 12/30/19 0100     12/30/19 0000  cefTRIAXone (ROCEPHIN) 1 g in sodium chloride 0.9 % 100 mL IVPB        1 g 200 mL/hr over 30 Minutes Intravenous  Once 12/29/19 2357 12/30/19 0121       Time Spent in minutes  30   Lala Lund M.D on 12/30/2019  at 9:28 AM  To page go to [email or web address] - password [facility]  [facility] -  Office  [phone]    See all Orders from today for further details    Objective:   Vitals:   12/30/19 0600 12/30/19 0700 12/30/19 0730 12/30/19 0800  BP: (!) 167/66 (!) 183/93 (!) 164/72 (!) 169/76  Pulse: 66 73 66 69  Resp: 18 (!) 21 20 17   Temp:      TempSrc:      SpO2: 96% 97% 96% 96%  Weight:      Height:        Wt Readings from Last 3 Encounters:  12/29/19 58 kg  02/12/14 58.1 kg  05/30/13 55.9 kg     Intake/Output Summary (Last 24 hours) at 12/30/2019 0928 Last data filed at 12/30/2019 3419 Gross per 24 hour  Intake 1346.97 ml  Output 560 ml  Net 786.97 ml     Physical Exam  Awake Alert, No new F.N deficits, Normal affect [place].AT, left lateral scalp laceration about 3 cm in length stapled with some old blood around it Supple Neck,No JVD, No cervical lymphadenopathy appriciated.  Symmetrical Chest wall movement, Good air movement bilaterally, CTAB RRR,No Gallops,Rubs or new Murmurs, No Parasternal Heave +ve B.Sounds, Abd Soft, No tenderness, No  organomegaly appriciated, No rebound - guarding or rigidity. No Cyanosis, Clubbing or edema, No new Rash or bruise     Data Review:    CBC Recent Labs  Lab 12/29/19 1713 12/30/19 0449  WBC 4.3 4.6  HGB 13.8 13.8  HCT 39.2 39.5  PLT 130* 114*  MCV 82.4 82.3  MCH 29.0 28.8  MCHC 35.2 34.9  RDW 12.0 12.2    Recent Labs  Lab 12/29/19 1713 12/30/19 0449  NA 117* 119*  K 3.6 3.1*  CL 79* 82*  CO2 26 26  GLUCOSE 121* 86  BUN 10 7*  CREATININE 0.74 0.58  CALCIUM 8.5* 8.1*  AST  --  45*  ALT  --  29  ALKPHOS  --  72  BILITOT  --  0.7  ALBUMIN  --  3.2*  CRP  --  0.5  DDIMER  --  0.52*  TSH  --  4.688*    ------------------------------------------------------------------------------------------------------------------ No results for input(s): CHOL, HDL, LDLCALC, TRIG, CHOLHDL, LDLDIRECT in the last 72 hours.  No results found for: HGBA1C ------------------------------------------------------------------------------------------------------------------ Recent Labs    12/30/19 0449  TSH 4.688*    Cardiac Enzymes No results for input(s): CKMB, TROPONINI, MYOGLOBIN in the last 168 hours.  Invalid input(s): CK ------------------------------------------------------------------------------------------------------------------ No results found for: BNP  Micro Results Recent Results (from the past 240 hour(s))  SARS Coronavirus 2 by RT PCR (hospital order, performed in [facility] hospital lab) Nasopharyngeal Nasopharyngeal Swab     Status: Abnormal   Collection Time: 12/30/19 12:08 AM   Specimen: Nasopharyngeal Swab  Result Value Ref Range Status   SARS Coronavirus 2 POSITIVE (A) NEGATIVE Final    Comment: RESULT CALLED TO, READ BACK BY AND VERIFIED WITH: J. Jorge Ny 0126 12/30/2019 T. TYSOR (NOTE) SARS-CoV-2 target nucleic acids are DETECTED  SARS-CoV-2 RNA is generally detectable in upper respiratory specimens  during the acute phase of infection.  Positive  results are indicative  of the presence of the identified virus, but do not rule out bacterial infection or co-infection with other pathogens not detected by the test.  Clinical correlation with patient history and  other diagnostic information is necessary to determine patient infection status.  The expected result  is negative.  Fact Sheet for Patients:   StrictlyIdeas.no   Fact Sheet for Healthcare Providers:   BankingDealers.co.za    This test is not yet approved or cleared by the [place] FDA and  has been authorized for detection and/or diagnosis of SARS-CoV-2 by FDA under an Emergency Use Authorization (EUA).  This EUA will remain in effect (meaning thi s test can be used) for the duration of  the COVID-19 declaration under Section 564(b)(1) of the Act, 21 U.S.C. section 360-bbb-3(b)(1), unless the authorization is terminated or revoked sooner.  Performed at [facility], [place] [address], [place], [place] [address]     Radiology Reports CT HEAD WO CONTRAST  Result Date: 12/29/2019 CLINICAL DATA:  Head trauma. Fall this afternoon. Struck left side of head. EXAM: CT HEAD WITHOUT CONTRAST TECHNIQUE: Contiguous axial images were obtained from the base of the skull through the vertex without intravenous contrast. COMPARISON:  None. FINDINGS: Brain: Age related atrophy. Moderate chronic small vessel ischemia. Remote lacunar infarct in the right basal ganglia. No intracranial hemorrhage, mass effect, or midline shift. No hydrocephalus. The basilar cisterns are patent. No evidence of territorial infarct or acute ischemia. No extra-axial or intracranial fluid collection. Vascular: Atherosclerosis of skullbase vasculature without hyperdense vessel or abnormal calcification. Skull: Left frontal scalp hematoma without subjacent fracture. No focal bone lesion. Sinuses/Orbits: Paranasal sinuses and mastoid air cells are clear. The  visualized orbits are unremarkable. Bilateral cataract resection. Other: None. IMPRESSION: 1. Left frontal scalp hematoma. No acute intracranial abnormality. No skull fracture. 2. Age related atrophy and chronic small vessel ischemia. Remote lacunar infarct in the right basal ganglia. Electronically Signed   By: Keith Rake M.D.   On: 12/29/2019 17:32

## 2019-12-30 NOTE — Progress Notes (Signed)
Pharmacy COVID-19 Monoclonal Antibody Screening  Claudia Stein was identified as being not hospitalized with symptoms from Covid-19 on admission but an incidental positive PCR has been documented.  The patient may qualify for the use of monoclonal antibodies (mAB) for COVID-19 viral infection to prevent worsening symptoms stemming from Covid-19 infection.  The patient was identified based on a positive COVID-19 PCR and not requiring the use of supplemental oxygen at this time.  This patient meets the FDA criteria for Emergency Use Authorization of casirivimab/imdevimab.  Has a (+) direct SARS-CoV-2 viral test result  Is NOT hospitalized due to COVID-19  Is within 10 days of symptom onset  Has at least one of the high risk factor(s) for progression to severe COVID-19 and/or hospitalization as defined in EUA.  Specific high risk criteria : Older age (>/= 84 yo)  Additionally: The patient has had a positive COVID-19 PCR in the last 90 days.  The patient is unvaccinated against COVID-19.  Since the patient is unvaccinated and meets high risk criteria, the patient is eligible for mAB administration.   This eligibility and indication for treatment was discussed with the patient's physician: Dr. Myna Hidalgo  Plan: Based on the above discussion, it was decided that the patient will receive one dose of mAB combination.Casirivimab/imdevimab has been ordered. Pharmacy will coordinate administration timing with patient's nurse. Recommended infusion monitoring parameters communicated to the nursing team.  Narda Bonds, PharmD, [place] Pharmacist Phone: [phone]

## 2019-12-30 NOTE — ED Notes (Signed)
Pt sitting comfortably in recliner, talking on the phone. NAD noted.

## 2019-12-30 NOTE — ED Notes (Signed)
Pt eating breakfast.  Denies any needs at this time.

## 2019-12-30 NOTE — H&P (Signed)
History and Physical   Claudia Stein BPZ:[id number] DOB: [birth year] DOA: 12/29/2019  Referring MD/NP/PA: Dr Dina Rich  PCP: Tamsen Roers, MD   Outpatient Specialists: Nicholas Lose, MD Oncology   Patient coming from: Home  Chief Complaint: Fall with scalp laceration  HPI: Claudia Stein is a 86 y.o. female with medical history significant of breast cancer status post surgery and treatment, hypothyroidism, hypertension, hyperlipidemia, who has recently been getting weaker at home not eating or drinking adequately has also been withdrawing and failing to thrive.  Patient apparently was going up steps today missed a step and fell hitting her head against a stool.  She sustained a large laceration on the lateral part of her scalp with significant bleeding.  Brought to the ER where she was evaluated.  Patient is fully awake and communicating.  Daughter at bedside.  Reported poor oral intake.  She was noted to have a sodium of 117.  Other electrolyte abnormalities as described below.  She is also dehydrated.  Patient has been on diuretics as part of her blood pressure regimen.  She has 24-hour care at home.  Patient being admitted for evaluation and treatment..  ED Course: Temperature is 98.4 blood pressure 199/92 pulse 77 respiratory rate of 24 oxygen sats 93% room air.  CBC shows platelets of 130 sodium 117 chloride 79.  Calcium 8.5 glucose 121.  Creatinine 0.74 and the rest of chemistry within normal.  Urinalysis showed cloudy urine with large leukocytes.  WBC 21-50 and rare bacteria.  Head CT without contrast showed no acute intracranial abnormalities.  COVID-19 screen is negative.  She has left frontal scalp hematoma only on scan.  Patient being admitted to the hospital for further treatment.  Review of Systems: As per HPI otherwise 10 point review of systems negative.    Past Medical History:  Diagnosis Date  . Bruises easily   . Cancer ([facility]) 04-16-09, 2012   Breast- bilateral  . Hyperlipidemia    . Hypertension   . PONV (postoperative nausea and vomiting)   . Thyroid disease     Past Surgical History:  Procedure Laterality Date  . APPENDECTOMY    . BREAST SURGERY  04-16-09   left  Lumpectomy  . FRACTURE SURGERY     broke right ankle in the 80's  . MASTECTOMY  November 2011   right breast  . MASTECTOMY W/ SENTINEL NODE BIOPSY  03/22/2011   Procedure: MASTECTOMY WITH SENTINEL LYMPH NODE BIOPSY;  Surgeon: Shann Medal, MD;  Location: [facility];  Service: General;  Laterality: Left;  left mastectomy, sential lymph node biopsy, left axilla     reports that she has never smoked. She has never used smokeless tobacco. She reports that she does not drink alcohol and does not use drugs.  No Known Allergies  Family History  Problem Relation Age of Onset  . Heart disease Mother 5  . Cancer Paternal Aunt        colon     Prior to Admission medications   Medication Sig Start Date End Date Taking? Authorizing Provider  amLODipine (NORVASC) 2.5 MG tablet Take 2.5 mg by mouth daily.   Yes [provider]  Calcium Carbonate-Vitamin D (CALCIUM PLUS VITAMIN D PO) Take 1 tablet by mouth daily.     Yes [provider]  cholecalciferol (VITAMIN D3) 25 MCG (1000 UNIT) tablet Take 1,000 Units by mouth daily.   Yes [provider]  hydrochlorothiazide 25 MG tablet Take 25 mg by mouth daily.  Yes [provider]  latanoprost (XALATAN) 0.005 % ophthalmic solution Place 1 drop into both eyes at bedtime.  12/10/19  Yes [provider]  levothyroxine (SYNTHROID, LEVOTHROID) 50 MCG tablet Take 50 mcg by mouth daily before breakfast.    Yes [provider]  losartan (COZAAR) 25 MG tablet Take 25 mg by mouth daily.    Yes [provider]  Multiple Vitamins-Minerals (PRESERVISION AREDS 2 PO) Take 1 tablet by mouth in the morning and at bedtime.    Yes [provider]  simvastatin (ZOCOR) 5 MG tablet Take 5 mg by mouth at bedtime.     Yes [provider]  vitamin B-12 (CYANOCOBALAMIN) 1000 MCG tablet Take 1,000 mcg by mouth daily.     Yes [provider]  vitamin C (ASCORBIC ACID) 500 MG tablet Take 500 mg by mouth daily.     Yes [provider]    Physical Exam: Vitals:   12/29/19 2345 12/30/19 0000 12/30/19 0015 12/30/19 0030  BP: (!) 158/85 (!) 199/92 (!) 179/72 (!) 179/83  Pulse: 63 66 63 61  Resp: 18 19 15 19   Temp: 98.4 F (36.9 C)     TempSrc: Oral     SpO2: 98% 96% 97% 96%  Weight:      Height:          Constitutional: Awake alert with the large frontal scalp laceration Vitals:   12/29/19 2345 12/30/19 0000 12/30/19 0015 12/30/19 0030  BP: (!) 158/85 (!) 199/92 (!) 179/72 (!) 179/83  Pulse: 63 66 63 61  Resp: 18 19 15 19   Temp: 98.4 F (36.9 C)     TempSrc: Oral     SpO2: 98% 96% 97% 96%  Weight:      Height:       Eyes: PERRL, lids and conjunctivae normal ENMT: Mucous membranes are dry. Posterior pharynx clear of any exudate or lesions.Normal dentition.  Neck: normal, supple, no masses, no thyromegaly Respiratory: clear to auscultation bilaterally, no wheezing, no crackles. Normal respiratory effort. No accessory muscle use.  Cardiovascular: Regular rate and rhythm, no murmurs / rubs / gallops. No extremity edema. 2+ pedal pulses. No carotid bruits.  Abdomen: no tenderness, no masses palpated. No hepatosplenomegaly. Bowel sounds positive.  Musculoskeletal: no clubbing / cyanosis. No joint deformity upper and lower extremities. Good ROM, no contractures. Normal muscle tone.  Skin: Large scalp laceration bleeding profusely with deep lesion going to the muscles.  No rashes, lesions, ulcers. No induration Neurologic: CN 2-12 grossly intact. Sensation intact, DTR normal. Strength 5/5 in all 4.  Psychiatric: Normal judgment and insight. Alert and oriented x 3. Normal mood.     Labs on Admission: I have personally reviewed following labs and imaging  studies  CBC: Recent Labs  Lab 12/29/19 1713  WBC 4.3  HGB 13.8  HCT 39.2  MCV 82.4  PLT 147*   Basic Metabolic Panel: Recent Labs  Lab 12/29/19 1713  NA 117*  K 3.6  CL 79*  CO2 26  GLUCOSE 121*  BUN 10  CREATININE 0.74  CALCIUM 8.5*   GFR: Estimated Creatinine Clearance: 42.1 mL/min (by C-G formula based on SCr of 0.74 mg/dL). Liver Function Tests: No results for input(s): AST, ALT, ALKPHOS, BILITOT, PROT, ALBUMIN in the last 168 hours. No results for input(s): LIPASE, AMYLASE in the last 168 hours. No results for input(s): AMMONIA in the last 168 hours. Coagulation Profile: No results for input(s): INR, PROTIME in the last 168 hours. Cardiac Enzymes:  No results for input(s): CKTOTAL, CKMB, CKMBINDEX, TROPONINI in the last 168 hours. BNP (last 3 results) No results for input(s): PROBNP in the last 8760 hours. HbA1C: No results for input(s): HGBA1C in the last 72 hours. CBG: No results for input(s): GLUCAP in the last 168 hours. Lipid Profile: No results for input(s): CHOL, HDL, LDLCALC, TRIG, CHOLHDL, LDLDIRECT in the last 72 hours. Thyroid Function Tests: No results for input(s): TSH, T4TOTAL, FREET4, T3FREE, THYROIDAB in the last 72 hours. Anemia Panel: No results for input(s): VITAMINB12, FOLATE, FERRITIN, TIBC, IRON, RETICCTPCT in the last 72 hours. Urine analysis:    Component Value Date/Time   COLORURINE AMBER (A) 12/29/2019 1955   APPEARANCEUR CLOUDY (A) 12/29/2019 1955   LABSPEC 1.020 12/29/2019 1955   PHURINE 5.0 12/29/2019 1955   GLUCOSEU NEGATIVE 12/29/2019 1955   HGBUR SMALL (A) 12/29/2019 [place] NEGATIVE 12/29/2019 1955   KETONESUR 5 (A) 12/29/2019 1955   PROTEINUR >=300 (A) 12/29/2019 1955   NITRITE NEGATIVE 12/29/2019 1955   LEUKOCYTESUR LARGE (A) 12/29/2019 1955   Sepsis Labs: @LABRCNTIP (procalcitonin:4,lacticidven:4) )No results found for this or any previous visit (from the past 240 hour(s)).   Radiological Exams on  Admission: CT HEAD WO CONTRAST  Result Date: 12/29/2019 CLINICAL DATA:  Head trauma. Fall this afternoon. Struck left side of head. EXAM: CT HEAD WITHOUT CONTRAST TECHNIQUE: Contiguous axial images were obtained from the base of the skull through the vertex without intravenous contrast. COMPARISON:  None. FINDINGS: Brain: Age related atrophy. Moderate chronic small vessel ischemia. Remote lacunar infarct in the right basal ganglia. No intracranial hemorrhage, mass effect, or midline shift. No hydrocephalus. The basilar cisterns are patent. No evidence of territorial infarct or acute ischemia. No extra-axial or intracranial fluid collection. Vascular: Atherosclerosis of skullbase vasculature without hyperdense vessel or abnormal calcification. Skull: Left frontal scalp hematoma without subjacent fracture. No focal bone lesion. Sinuses/Orbits: Paranasal sinuses and mastoid air cells are clear. The visualized orbits are unremarkable. Bilateral cataract resection. Other: None. IMPRESSION: 1. Left frontal scalp hematoma. No acute intracranial abnormality. No skull fracture. 2. Age related atrophy and chronic small vessel ischemia. Remote lacunar infarct in the right basal ganglia. Electronically Signed   By: Keith Rake M.D.   On: 12/29/2019 17:32    EKG: Independently reviewed.  Shows sinus rhythm with occasional PVCs with left bundle branch block.  Appears unchanged from previous  Assessment/Plan Principal Problem:   Fall at home, initial encounter Active Problems:   History of breast cancer, left, left mastectomy - 03/22/2011.  T1a, N0.   Essential hypertension   Hyperlipemia   Acute lower UTI   Thrombocytopenia (HCC)   FTT (failure to thrive) in adult   Hypothyroidism   Hyponatremia   Laceration of scalp     #1 fall at home: Appears to be mechanical but with her dehydration hyponatremia possibly related.  Patient will be admitted.  PT OT consult prior to discharge.  Evaluate for her  failure to thrive at home.  #2 hyponatremia: Significantly low sodium.  Poor oral intake and use of diuretics may be responsible.  We will hold that.  Hydrate patient with saline and monitor.  Expand volume.  Check urine and serum osmolality.  #3 UTI: May also be responsible for poor appetite and dehydration.  Rocephin initiated empirically.  Follow urine and blood cultures.  #4 left frontal scalp laceration: Suturing in place.  Wound care.  #5 hypothyroidism: Check TSH and thyroid panel.  May be responsible for possibly for the  hyponatremia.  #6 hypertension: Resume home regimen and adjust as necessary  #7 hyperlipidemia: Resume home statin.  #8 failure to thrive: Reevaluate patient when medically stable.  Disposition to be discussed with family.  #9 history of breast cancer, status post mastectomy and oncology following.   DVT prophylaxis: Heparin Code Status: Full code Family Communication: Daughter at bedside Disposition Plan: To be determined Consults called: None Admission status: Inpatient  Severity of Illness: The appropriate patient status for this patient is INPATIENT. Inpatient status is judged to be reasonable and necessary in order to provide the required intensity of service to ensure the patient's safety. The patient's presenting symptoms, physical exam findings, and initial radiographic and laboratory data in the context of their chronic comorbidities is felt to place them at high risk for further clinical deterioration. Furthermore, it is not anticipated that the patient will be medically stable for discharge from the hospital within 2 midnights of admission. The following factors support the patient status of inpatient.   " The patient's presenting symptoms include fall with laceration. " The worrisome physical exam findings include laceration of the frontal scalp. " The initial radiographic and laboratory data are worrisome because of sodium 117. " The chronic  co-morbidities include hypertension and breast cancer.   * I certify that at the point of admission it is my clinical judgment that the patient will require inpatient hospital care spanning beyond 2 midnights from the point of admission due to high intensity of service, high risk for further deterioration and high frequency of surveillance required.Barbette Merino MD [facility] Pager [phone]  If 7PM-7AM, please contact night-coverage [email or web address] Password [facility]  12/30/2019, 12:58 AM

## 2019-12-31 LAB — COMPREHENSIVE METABOLIC PANEL
ALT: 27 U/L (ref 0–44)
AST: 33 U/L (ref 15–41)
Albumin: 2.8 g/dL — ABNORMAL LOW (ref 3.5–5.0)
Alkaline Phosphatase: 77 U/L (ref 38–126)
Anion gap: 11 (ref 5–15)
BUN: 6 mg/dL — ABNORMAL LOW (ref 8–23)
CO2: 26 mmol/L (ref 22–32)
Calcium: 8 mg/dL — ABNORMAL LOW (ref 8.9–10.3)
Chloride: 80 mmol/L — ABNORMAL LOW (ref 98–111)
Creatinine, Ser: 0.59 mg/dL (ref 0.44–1.00)
GFR calc Af Amer: >60 mL/min (ref >60)
GFR calc non Af Amer: >60 mL/min (ref >60)
Glucose, Bld: 98 mg/dL (ref 70–99)
Potassium: 3.6 mmol/L (ref 3.5–5.1)
Sodium: 117 mmol/L — CL (ref 135–145)
Total Bilirubin: 0.3 mg/dL (ref 0.3–1.2)
Total Protein: 6.1 g/dL — ABNORMAL LOW (ref 6.5–8.1)

## 2019-12-31 LAB — URINE CULTURE

## 2019-12-31 LAB — CBC WITH DIFFERENTIAL/PLATELET
Abs Immature Granulocytes: 0.01 10*3/uL (ref 0.00–0.07)
Basophils Absolute: 0 10*3/uL (ref 0.0–0.1)
Basophils Relative: 0 %
Eosinophils Absolute: 0 10*3/uL (ref 0.0–0.5)
Eosinophils Relative: 0 %
HCT: 36.4 % (ref 36.0–46.0)
Hemoglobin: 12.9 g/dL (ref 12.0–15.0)
Immature Granulocytes: 0 %
Lymphocytes Relative: 17 %
Lymphs Abs: 0.6 10*3/uL — ABNORMAL LOW (ref 0.7–4.0)
MCH: 28.8 pg (ref 26.0–34.0)
MCHC: 35.4 g/dL (ref 30.0–36.0)
MCV: 81.3 fL (ref 80.0–100.0)
Monocytes Absolute: 0.4 10*3/uL (ref 0.1–1.0)
Monocytes Relative: 10 %
Neutro Abs: 2.5 10*3/uL (ref 1.7–7.7)
Neutrophils Relative %: 73 %
Platelets: 106 10*3/uL — ABNORMAL LOW (ref 150–400)
RBC: 4.48 MIL/uL (ref 3.87–5.11)
RDW: 12.1 % (ref 11.5–15.5)
WBC: 3.4 10*3/uL — ABNORMAL LOW (ref 4.0–10.5)
nRBC: 0 % (ref 0.0–0.2)

## 2019-12-31 LAB — D-DIMER, QUANTITATIVE: D-Dimer, Quant: 0.5 ug/mL-FEU (ref 0.00–0.50)

## 2019-12-31 LAB — OSMOLALITY: Osmolality: 246 mOsm/kg — CL (ref 275–295)

## 2019-12-31 LAB — BRAIN NATRIURETIC PEPTIDE: B Natriuretic Peptide: 361.1 pg/mL — ABNORMAL HIGH (ref 0.0–100.0)

## 2019-12-31 LAB — MAGNESIUM: Magnesium: 1.1 mg/dL — ABNORMAL LOW (ref 1.7–2.4)

## 2019-12-31 LAB — C-REACTIVE PROTEIN: CRP: 0.9 mg/dL (ref <1.0)

## 2019-12-31 MED ORDER — FUROSEMIDE 10 MG/ML IJ SOLN
20.0000 mg | Freq: Once | INTRAMUSCULAR | Status: AC
  Administered 2019-12-31: 20 mg via INTRAVENOUS
  Filled 2019-12-31: qty 2

## 2019-12-31 MED ORDER — MAGNESIUM SULFATE 2 GM/50ML IV SOLN
2.0000 g | Freq: Once | INTRAVENOUS | Status: AC
  Administered 2019-12-31: 2 g via INTRAVENOUS
  Filled 2019-12-31: qty 50

## 2019-12-31 MED ORDER — ENSURE ENLIVE PO LIQD
237.0000 mL | Freq: Two times a day (BID) | ORAL | Status: DC
  Administered 2019-12-31 – 2020-01-03 (×7): 237 mL via ORAL

## 2019-12-31 NOTE — Progress Notes (Signed)
PROGRESS NOTE                                                                                                                                                                                                             Patient Demographics:    Claudia Stein, is a 86 y.o. female, DOB - 1934-03-02, WJX:[id number]  Outpatient Primary MD for the patient is Tamsen Roers, MD    LOS - 1  Admit date - 12/29/2019    Chief Complaint  Patient presents with  . Fall  . Dizziness       Brief Narrative - Claudia Stein is a 86 y.o. female with medical history significant of breast cancer status post surgery and treatment, hypothyroidism, hypertension, hyperlipidemia, who has recently been getting weaker at home not eating or drinking adequately has also been withdrawing and failing to thrive, she sustained a fall going up the stairs of her home incurred left scalp laceration and was brought to the ER where the laceration was sutured.  She was found to be incidental positive for COVID-19 infection and also had evidence of severe hyponatremia.  She is not vaccinated for Covid.   Subjective:    [name] today has, No headache, No chest pain, No abdominal pain - No Nausea, No new weakness tingling or numbness, no Cough - no SOB.     Assessment  & Plan :      1. Incidental Covid positive status -no cough or shortness of breath, CRP stable, she has received Regen - Cov antibody cocktail.  Will obtain baseline chest x-ray and monitor.  Encouraged the patient to sit up in chair in the daytime use I-S and flutter valve for pulmonary toiletry and then prone in bed when at night.  Will advance activity and titrate down oxygen as possible.  SpO2: 93 %  Recent Labs  Lab 12/29/19 1713 12/30/19 0008 12/30/19 0449 12/30/19 0834 12/31/19 0630  WBC 4.3  --  4.6  --  3.4*  PLT 130*  --  114*  --  106*  CRP  --   --  0.5 0.5 0.9  AST  --   --  45*   --  33  ALT  --   --  29  --  27  ALKPHOS  --   --  72  --  77  BILITOT  --   --  0.7  --  0.3  ALBUMIN  --   --  3.2*  --  2.8*  DDIMER  --   --  0.52*  --  0.50  SARSCOV2NAA  --  POSITIVE*  --   --   --     2.  Mechanical fall with left scalp laceration.  Laceration has been sutured in the ER, CT head nonacute, PT OT and monitor.  3.  Hyponatremia.  Worse after IV fluids, urine sodium greater than 100, and osmolality greater than serum osmolality with serum uric acid being on the lower side suggesting SIADH, fluid restriction to Lasix and monitor.  If all fails Samsca.  4.  Essential hypertension.  Placed on combination of Norvasc and Coreg.  5.  Dyslipidemia.  On statin continue.  6.  Severe protein calorie malnutrition and failure to thrive.  Protein supplements, PT OT.  7.   Hypothyroidism.  On Synthroid with stable TSH.  Lab Results  Component Value Date   TSH 4.688 (H) 12/30/2019     Condition -   Guarded  Family Communication  : Husband Inocente Salles [phone]  on 12/30/2019 at 9:31 AM message left  Code Status :  Full  Consults  : None  Procedures  :    CT Head.  Left scalp hematoma.  PUD Prophylaxis : None  Disposition Plan  :    Status is: Inpatient  Not inpatient appropriate, will call [facility] team and downgrade to OBS.   Dispo: The patient is from: Home              Anticipated d/c is to: SNF              Anticipated d/c date is: > 3 days              Patient currently is not medically stable to d/c.   DVT Prophylaxis  :  SCD and  heparin on 12/31/2019  Lab Results  Component Value Date   PLT 106 (L) 12/31/2019    Diet :  Diet Order            Diet regular Room service appropriate? Yes; Fluid consistency: Thin; Fluid restriction: 1200 mL Fluid  Diet effective now                  Inpatient Medications  Scheduled Meds: . amLODipine  10 mg Oral Daily  . vitamin C  500 mg Oral Daily  . calcium-vitamin D  1 tablet Oral QAC supper  . carvedilol   3.125 mg Oral BID WC  . cholecalciferol  1,000 Units Oral Daily  . feeding supplement  1 Container Oral TID BM  . heparin  5,000 Units Subcutaneous Q8H  . latanoprost  1 drop Both Eyes QHS  . levothyroxine  50 mcg Oral QAC breakfast  . simvastatin  5 mg Oral QHS  . vitamin B-12  1,000 mcg Oral Daily   Continuous Infusions: . sodium chloride    . cefTRIAXone (ROCEPHIN)  IV Stopped (12/30/19 2121)  . famotidine (PEPCID) IV     PRN Meds:.sodium chloride, acetaminophen **OR** [DISCONTINUED] acetaminophen, albuterol, EPINEPHrine, famotidine (PEPCID) IV, [DISCONTINUED] ondansetron **OR** ondansetron (ZOFRAN) IV  Antibiotics  :    Anti-infectives (From admission, onward)   Start     Dose/Rate Route Frequency Ordered Stop   12/30/19 1600  cefTRIAXone (ROCEPHIN) 1 g in sodium chloride 0.9 % 100 mL IVPB  1 g 200 mL/hr over 30 Minutes Intravenous Daily 12/30/19 0100     12/30/19 0000  cefTRIAXone (ROCEPHIN) 1 g in sodium chloride 0.9 % 100 mL IVPB        1 g 200 mL/hr over 30 Minutes Intravenous  Once 12/29/19 2357 12/30/19 0121       Time Spent in minutes  30   Lala Lund M.D on 12/31/2019 at 2:08 PM  To page go to [email or web address] - password [facility]  [facility] -  Office  [phone]    See all Orders from today for further details    Objective:   Vitals:   12/31/19 0500 12/31/19 0820 12/31/19 0900 12/31/19 1240  BP: (!) 164/80 (!) 176/74  123/67  Pulse: 68 70  (!) 56  Resp: 17  17 16   Temp: 98.7 F (37.1 C)  98.3 F (36.8 C)   TempSrc: Oral  Axillary   SpO2: 93%  94% 93%  Weight: 50.6 kg     Height: 5\' 2"  (1.575 m)       Wt Readings from Last 3 Encounters:  12/31/19 50.6 kg  02/12/14 58.1 kg  05/30/13 55.9 kg     Intake/Output Summary (Last 24 hours) at 12/31/2019 1408 Last data filed at 12/30/2019 2121 Gross per 24 hour  Intake 100 ml  Output --  Net 100 ml     Physical Exam  Awake Alert, No new F.N deficits, Normal affect [place].AT, left  lateral scalp laceration about 3 cm in length stapled   Supple Neck,No JVD, No cervical lymphadenopathy appriciated.  Symmetrical Chest wall movement, Good air movement bilaterally, CTAB RRR,No Gallops, Rubs or new Murmurs, No Parasternal Heave +ve B.Sounds, Abd Soft, No tenderness, No organomegaly appriciated, No rebound - guarding or rigidity. No Cyanosis, Clubbing or edema, No new Rash or bruise    Data Review:    CBC Recent Labs  Lab 12/29/19 1713 12/30/19 0449 12/31/19 0630  WBC 4.3 4.6 3.4*  HGB 13.8 13.8 12.9  HCT 39.2 39.5 36.4  PLT 130* 114* 106*  MCV 82.4 82.3 81.3  MCH 29.0 28.8 28.8  MCHC 35.2 34.9 35.4  RDW 12.0 12.2 12.1  LYMPHSABS  --   --  0.6*  MONOABS  --   --  0.4  EOSABS  --   --  0.0  BASOSABS  --   --  0.0    Recent Labs  Lab 12/29/19 1713 12/30/19 0449 12/30/19 0834 12/31/19 0630  NA 117* 119*  --  117*  K 3.6 3.1*  --  3.6  CL 79* 82*  --  80*  CO2 26 26  --  26  GLUCOSE 121* 86  --  98  BUN 10 7*  --  6*  CREATININE 0.74 0.58  --  0.59  CALCIUM 8.5* 8.1*  --  8.0*  AST  --  45*  --  33  ALT  --  29  --  27  ALKPHOS  --  72  --  77  BILITOT  --  0.7  --  0.3  ALBUMIN  --  3.2*  --  2.8*  MG  --   --  1.0* 1.1*  CRP  --  0.5 0.5 0.9  DDIMER  --  0.52*  --  0.50  TSH  --  4.688*  --   --   BNP  --   --  345.0* 361.1*    ------------------------------------------------------------------------------------------------------------------ No results for input(s): CHOL, HDL, LDLCALC, TRIG, CHOLHDL,  LDLDIRECT in the last 72 hours.  No results found for: HGBA1C ------------------------------------------------------------------------------------------------------------------ Recent Labs    12/30/19 0449  TSH 4.688*    Cardiac Enzymes No results for input(s): CKMB, TROPONINI, MYOGLOBIN in the last 168 hours.  Invalid input(s):  CK ------------------------------------------------------------------------------------------------------------------    Component Value Date/Time   BNP 361.1 (H) 12/31/2019 0630    Micro Results Recent Results (from the past 240 hour(s))  Urine culture     Status: Abnormal   Collection Time: 12/29/19 11:50 PM   Specimen: Urine, Random  Result Value Ref Range Status   Specimen Description URINE, RANDOM  Final   Special Requests   Final    NONE Performed at [facility], [address], [place], [place] [address]    Culture MULTIPLE SPECIES PRESENT, SUGGEST RECOLLECTION (A)  Final   Report Status 12/31/2019 FINAL  Final  SARS Coronavirus 2 by RT PCR (hospital order, performed in [facility] hospital lab) Nasopharyngeal Nasopharyngeal Swab     Status: Abnormal   Collection Time: 12/30/19 12:08 AM   Specimen: Nasopharyngeal Swab  Result Value Ref Range Status   SARS Coronavirus 2 POSITIVE (A) NEGATIVE Final    Comment: RESULT CALLED TO, READ BACK BY AND VERIFIED WITH: J. Jorge Ny 0126 12/30/2019 T. TYSOR (NOTE) SARS-CoV-2 target nucleic acids are DETECTED  SARS-CoV-2 RNA is generally detectable in upper respiratory specimens  during the acute phase of infection.  Positive results are indicative  of the presence of the identified virus, but do not rule out bacterial infection or co-infection with other pathogens not detected by the test.  Clinical correlation with patient history and  other diagnostic information is necessary to determine patient infection status.  The expected result is negative.  Fact Sheet for Patients:   StrictlyIdeas.no   Fact Sheet for Healthcare Providers:   BankingDealers.co.za    This test is not yet approved or cleared by the [place] FDA and  has been authorized for detection and/or diagnosis of SARS-CoV-2 by FDA under an Emergency Use Authorization (EUA).  This EUA will remain in effect  (meaning thi s test can be used) for the duration of  the COVID-19 declaration under Section 564(b)(1) of the Act, 21 U.S.C. section 360-bbb-3(b)(1), unless the authorization is terminated or revoked sooner.  Performed at [facility], [place] [address], [place],  [address]     Radiology Reports CT HEAD WO CONTRAST  Result Date: 12/29/2019 CLINICAL DATA:  Head trauma. Fall this afternoon. Struck left side of head. EXAM: CT HEAD WITHOUT CONTRAST TECHNIQUE: Contiguous axial images were obtained from the base of the skull through the vertex without intravenous contrast. COMPARISON:  None. FINDINGS: Brain: Age related atrophy. Moderate chronic small vessel ischemia. Remote lacunar infarct in the right basal ganglia. No intracranial hemorrhage, mass effect, or midline shift. No hydrocephalus. The basilar cisterns are patent. No evidence of territorial infarct or acute ischemia. No extra-axial or intracranial fluid collection. Vascular: Atherosclerosis of skullbase vasculature without hyperdense vessel or abnormal calcification. Skull: Left frontal scalp hematoma without subjacent fracture. No focal bone lesion. Sinuses/Orbits: Paranasal sinuses and mastoid air cells are clear. The visualized orbits are unremarkable. Bilateral cataract resection. Other: None. IMPRESSION: 1. Left frontal scalp hematoma. No acute intracranial abnormality. No skull fracture. 2. Age related atrophy and chronic small vessel ischemia. Remote lacunar infarct in the right basal ganglia. Electronically Signed   By: Keith Rake M.D.   On: 12/29/2019 17:32   DG Chest [facility]  Result Date: 12/30/2019 CLINICAL DATA:  COVID-19 positive.  Shortness of breath. EXAM: PORTABLE CHEST 1 VIEW COMPARISON:  03/16/2011 FINDINGS: Coarse lung markings are suggestive for chronic changes. No significant airspace disease or lung consolidation. Surgical clips in the left axilla or breast region. Heart size is mildly enlarged.  Atherosclerotic calcifications at the aortic arch. Negative for a pneumothorax. IMPRESSION: No acute chest findings. Electronically Signed   By: Markus Daft M.D.   On: 12/30/2019 10:21

## 2019-12-31 NOTE — Progress Notes (Signed)
On call MD paged to notify of pt prolonged Qtc. Pt is resting comfortably at this time, without any complaints of pain. VSS. Awaiting new orders.

## 2019-12-31 NOTE — Progress Notes (Signed)
Physical Therapy Treatment Patient Details Name: Claudia Stein MRN: [id number] DOB: 1934/04/18 Today's Date: 12/31/2019    History of Present Illness DEVIN FOSKEY is a 86 y.o. female with medical history significant of breast cancer status post surgery and treatment, hypothyroidism, hypertension, hyperlipidemia, who has recently been getting weaker at home not eating or drinking adequately has also been withdrawing and failing to thrive.  Patient apparently was going up steps today missed a step and fell hitting her head against a stool.  She sustained a large laceration on the lateral part of her scalp with significant bleeding.     PT Comments    Pt received in recliner, agreeable to therapy. She performed BLE exercises seated. Min assist required for transfers, and min assist ambulation 4' with RW. VSS on RA. Pt progressing well with mobility. She is very hopeful to return home at discharge.  Pt in recliner with feet elevated at end of session.   Follow Up Recommendations  Home health PT (if slow progress, must consider SNF)     Equipment Recommendations  Rolling walker with 5" wheels;3in1 (PT)    Recommendations for Other Services       Precautions / Restrictions Precautions Precautions: Fall    Mobility  Bed Mobility               General bed mobility comments: Pt received in recliner.  Transfers Overall transfer level: Needs assistance Equipment used: Ambulation equipment used Transfers: Sit to/from Stand Sit to Stand: Min assist         General transfer comment: cues for hand placement. Assist to power up and steady.  Ambulation/Gait Ambulation/Gait assistance: Min assist Gait Distance (Feet): 50 Feet Assistive device: Rolling walker (2 wheeled) Gait Pattern/deviations: Step-through pattern;Decreased stride length;Trunk flexed Gait velocity: decreased Gait velocity interpretation: <1.31 ft/sec, indicative of household ambulator General Gait Details:  cues to stay close to [employer] Mobility    Modified Rankin (Stroke Patients Only)       Balance Overall balance assessment: Needs assistance Sitting-balance support: No upper extremity supported;Feet supported Sitting balance-Leahy Scale: Fair     Standing balance support: Bilateral upper extremity supported;During functional activity;Single extremity supported Standing balance-Leahy Scale: Poor Standing balance comment: reliant on external support                            Cognition Arousal/Alertness: Awake/alert Behavior During Therapy: WFL for tasks assessed/performed Overall Cognitive Status: Within Functional Limits for tasks assessed                                        Exercises General Exercises - Lower Extremity Ankle Circles/Pumps: AROM;Both;10 reps;Seated Long Arc Quad: AROM;Right;Left;10 reps;Seated Hip ABduction/ADduction: AROM;Both;10 reps;Seated Hip Flexion/Marching: AROM;Right;Left;10 reps;Seated    General Comments General comments (skin integrity, edema, etc.): VSS on RA      Pertinent Vitals/Pain Pain Assessment: No/denies pain    Home Living                      Prior Function            PT Goals (current goals can now be found in the care plan section) Acute Rehab PT Goals Patient Stated Goal: Hopes to go home Progress towards PT  goals: Progressing toward goals    Frequency    Min 3X/week      PT Plan Current plan remains appropriate    Co-evaluation              AM-PAC PT "6 Clicks" Mobility   Outcome Measure  Help needed turning from your back to your side while in a flat bed without using bedrails?: None Help needed moving from lying on your back to sitting on the side of a flat bed without using bedrails?: A Little Help needed moving to and from a bed to a chair (including a wheelchair)?: A Little Help needed standing up from a chair using your  arms (e.g., wheelchair or bedside chair)?: A Little Help needed to walk in hospital room?: A Little Help needed climbing 3-5 steps with a railing? : A Lot 6 Click Score: 18    End of Session Equipment Utilized During Treatment: Gait belt Activity Tolerance: Patient tolerated treatment well Patient left: in chair;with call bell/phone within reach;with chair alarm set Nurse Communication: Mobility status PT Visit Diagnosis: Other abnormalities of gait and mobility (R26.89)     Time: 2641-5830 PT Time Calculation (min) (ACUTE ONLY): 25 min  Charges:  $Gait Training: 8-22 mins $Therapeutic Exercise: 8-22 mins                     Lorrin Goodell, PT  Office # [phone] Pager [phone]    Lorriane Shire 12/31/2019, 11:38 AM

## 2019-12-31 NOTE — Progress Notes (Addendum)
Pt arrived to [place] room 69 from [facility] emergency department . Pt is A/O x4. 93% on room air. Pt has no complaints of pain at this time. On call MD paged to notify of admission.

## 2019-12-31 NOTE — Progress Notes (Signed)
Initial Nutrition Assessment  DOCUMENTATION CODES:   Not applicable  INTERVENTION:  Provide Ensure Enlive po BID, each supplement provides 350 kcal and 20 grams of protein.  Encourage adequate PO intake.   NUTRITION DIAGNOSIS:   Increased nutrient needs related to acute illness (COVID) as evidenced by estimated needs.  GOAL:   Patient will meet greater than or equal to 90% of their needs  MONITOR:   PO intake, Supplement acceptance, Skin, Weight trends, Labs, I & O's  REASON FOR ASSESSMENT:   Malnutrition Screening Tool    ASSESSMENT:   86 y.o. female with medical history significant of breast cancer status post surgery and treatment, hypothyroidism, hypertension, hyperlipidemia presents with weakness and fall. Pt incidently found COVID positive.  RD working remotely. Pt unavailable during attempted time of contact. RD unable to obtain pt nutrition history at this time. RD to order nutritional supplements to aid in caloric and protein needs. Unable to complete Nutrition-Focused physical exam at this time. Labs and medications reviewed. Sodium low at 117. Magnesium low at 1.1.  Diet Order:   Diet Order            Diet regular Room service appropriate? Yes; Fluid consistency: Thin; Fluid restriction: 1200 mL Fluid  Diet effective now                 EDUCATION NEEDS:   Not appropriate for education at this time  Skin:  Skin Assessment: Reviewed RN Assessment  Last BM:  Unknown  Height:   Ht Readings from Last 1 Encounters:  12/31/19 5\' 2"  (1.575 m)    Weight:   Wt Readings from Last 1 Encounters:  12/31/19 50.6 kg    BMI:  Body mass index is 20.4 kg/m.  Estimated Nutritional Needs:   Kcal:  1500-1700  Protein:  70-85 grams  Fluid:  >/= 1.5 L/day  Corrin Parker, MS, RD, LDN RD pager number/after hours weekend pager number on Am[employer].

## 2019-12-31 NOTE — Evaluation (Addendum)
Occupational Therapy Evaluation Patient Details Name: Claudia Stein MRN: [id number] DOB: 09/26/1933 Today's Date: 12/31/2019    History of Present Illness Claudia Stein is a 86 y.o. female with medical history significant of breast cancer status post surgery and treatment, hypothyroidism, hypertension, hyperlipidemia, who has recently been getting weaker at home not eating or drinking adequately has also been withdrawing and failing to thrive.  Patient apparently was going up steps today missed a step and fell hitting her head against a stool.  She sustained a large laceration on the lateral part of her scalp with significant bleeding.    Clinical Impression   This 86 y/o female presents with the above. PTA pt living at home with grandson (grandson works nights, sleeps during the day) and reports being mod independent with ADL and mobility. Pt currently requiring minA for room level mobility using RW (cues for safety), up to [place] for LB ADL and toileting (episode of incontinence with mobility during session). VSS throughout on RA. Given pt's grandson typically sleeping during the day, pt will have to be close to mod independent to return home. Am hopeful pt will progress to point of returning home with [facility] services - if slower progress will have to consider SNF. Will continue to follow acutely.     Follow Up Recommendations  Home health OT;Supervision/Assistance - 24 hour (pending progress - has to be close to mod I for home)    Equipment Recommendations  3 in 1 bedside commode           Precautions / Restrictions Precautions Precautions: Fall Restrictions Weight Bearing Restrictions: No      Mobility Bed Mobility Overal bed mobility: Needs Assistance Bed Mobility: Sit to Supine       Sit to supine: Min guard   General bed mobility comments: for safety, lines   Transfers Overall transfer level: Needs assistance Equipment used: Rolling walker (2 wheeled) Transfers: Sit to/from  Stand Sit to Stand: Min assist         General transfer comment: steadying assist once upright    Balance Overall balance assessment: Needs assistance   Sitting balance-Leahy Scale: Fair       Standing balance-Leahy Scale: Poor Standing balance comment: reliant on external support                           ADL either performed or assessed with clinical judgement   ADL Overall ADL's : Needs assistance/impaired Eating/Feeding: Set up;Sitting   Grooming: Set up;Sitting   Upper Body Bathing: Supervision/ safety;Sitting   Lower Body Bathing: Minimal assistance;Sit to/from stand   Upper Body Dressing : Set up;Sitting   Lower Body Dressing: Moderate assistance;Sit to/from stand Lower Body Dressing Details (indicate cue type and reason): some assist to thread LEs into underwear, minA for standing balance  Toilet Transfer: Minimal assistance;Ambulation;RW Toilet Transfer Details (indicate cue type and reason): simulated via transfer chair to [place] and Hygiene: Minimal assistance;Sitting/lateral lean;Sit to/from stand       Functional mobility during ADLs: Minimal assistance;Rolling walker General ADL Comments: requires cues for safe proximity to RW; pt fatigued this session from sitting up in recliner and requesting to return to bed      Vision         Perception     Praxis      Pertinent Vitals/Pain Pain Assessment: No/denies pain     Hand Dominance     Extremity/Trunk Assessment Upper  Extremity Assessment Upper Extremity Assessment: Generalized weakness   Lower Extremity Assessment Lower Extremity Assessment: Defer to PT evaluation   Cervical / Trunk Assessment Cervical / Trunk Assessment: Other exceptions Cervical / Trunk Exceptions: appears scoliotic   Communication Communication Communication: No difficulties   Cognition Arousal/Alertness: Awake/alert Behavior During Therapy: WFL for tasks  assessed/performed                                   General Comments: pt aware of mesh underwear being soiled but initially not telling therapist    General Comments  VSS on RA    Exercises     Shoulder Instructions      Home Living Family/patient expects to be discharged to:: Private residence Living Arrangements: Other relatives Available Help at Discharge: Family;Available PRN/intermittently Type of Home: House Home Access: Stairs to enter [employer] of Steps: 1 (at kitchen entrance)   Home Layout: One level     Bathroom Shower/Tub: Occupational psychologist: Standard     Home Equipment: Cane - single point          Prior Functioning/Environment Level of Independence: Independent;Independent with assistive device(s)        Comments: uses a cane for amb most of the time        OT Problem List: Decreased strength;Decreased range of motion;Decreased activity tolerance;Impaired balance (sitting and/or standing);Cardiopulmonary status limiting activity;Decreased knowledge of use of DME or AE;Decreased cognition      OT Treatment/Interventions: Self-care/ADL training;Therapeutic exercise;Energy conservation;DME and/or AE instruction;Therapeutic activities;Cognitive remediation/compensation;Patient/family education;Balance training    OT Goals(Current goals can be found in the care plan section) Acute Rehab OT Goals Patient Stated Goal: Hopes to go home OT Goal Formulation: With patient Time For Goal Achievement: 01/14/20 Potential to Achieve Goals: Good  OT Frequency: Min 2X/week   Barriers to D/C:            Co-evaluation              AM-PAC OT "6 Clicks" Daily Activity     Outcome Measure Help from another person eating meals?: None Help from another person taking care of personal grooming?: A Little Help from another person toileting, which includes using toliet, bedpan, or urinal?: A Little Help from another  person bathing (including washing, rinsing, drying)?: A Little Help from another person to put on and taking off regular upper body clothing?: A Little Help from another person to put on and taking off regular lower body clothing?: A Lot 6 Click Score: 18   End of Session Equipment Utilized During Treatment: Gait belt;Rolling walker Nurse Communication: Mobility status  Activity Tolerance: Patient tolerated treatment well Patient left: in bed;with call bell/phone within reach;with bed alarm set  OT Visit Diagnosis: Unsteadiness on feet (R26.81);Muscle weakness (generalized) (M62.81)                Time: 4680-3212 OT Time Calculation (min): 21 min Charges:  OT General Charges $OT Visit: 1 Visit OT Evaluation $OT Eval Moderate Complexity: 1 Mod  Lou Cal, OT Acute Rehabilitation Services Pager [phone] Office [phone]   Raymondo Band 12/31/2019, 5:11 PM

## 2019-12-31 NOTE — ED Notes (Signed)
Attempted report x1. 

## 2019-12-31 NOTE — Progress Notes (Signed)
CRITICAL VALUE ALERT  Critical Value:  Sodium 117  Date & Time Notied:  0717  Provider Notified: Lala Lund, MD  Orders Received/Actions taken: Orders not yet placed at this time.

## 2020-01-01 LAB — COMPREHENSIVE METABOLIC PANEL
ALT: 26 U/L (ref 0–44)
AST: 29 U/L (ref 15–41)
Albumin: 2.8 g/dL — ABNORMAL LOW (ref 3.5–5.0)
Alkaline Phosphatase: 81 U/L (ref 38–126)
Anion gap: 10 (ref 5–15)
BUN: 15 mg/dL (ref 8–23)
CO2: 28 mmol/L (ref 22–32)
Calcium: 8.3 mg/dL — ABNORMAL LOW (ref 8.9–10.3)
Chloride: 78 mmol/L — ABNORMAL LOW (ref 98–111)
Creatinine, Ser: 0.7 mg/dL (ref 0.44–1.00)
GFR calc Af Amer: >60 mL/min (ref >60)
GFR calc non Af Amer: >60 mL/min (ref >60)
Glucose, Bld: 101 mg/dL — ABNORMAL HIGH (ref 70–99)
Potassium: 3.2 mmol/L — ABNORMAL LOW (ref 3.5–5.1)
Sodium: 116 mmol/L — CL (ref 135–145)
Total Bilirubin: 0.5 mg/dL (ref 0.3–1.2)
Total Protein: 6 g/dL — ABNORMAL LOW (ref 6.5–8.1)

## 2020-01-01 LAB — CBC WITH DIFFERENTIAL/PLATELET
Abs Immature Granulocytes: 0.02 10*3/uL (ref 0.00–0.07)
Basophils Absolute: 0 10*3/uL (ref 0.0–0.1)
Basophils Relative: 0 %
Eosinophils Absolute: 0 10*3/uL (ref 0.0–0.5)
Eosinophils Relative: 0 %
HCT: 37.9 % (ref 36.0–46.0)
Hemoglobin: 13.6 g/dL (ref 12.0–15.0)
Immature Granulocytes: 1 %
Lymphocytes Relative: 22 %
Lymphs Abs: 1 10*3/uL (ref 0.7–4.0)
MCH: 28.7 pg (ref 26.0–34.0)
MCHC: 35.9 g/dL (ref 30.0–36.0)
MCV: 80 fL (ref 80.0–100.0)
Monocytes Absolute: 0.5 10*3/uL (ref 0.1–1.0)
Monocytes Relative: 12 %
Neutro Abs: 2.9 10*3/uL (ref 1.7–7.7)
Neutrophils Relative %: 65 %
Platelets: 117 10*3/uL — ABNORMAL LOW (ref 150–400)
RBC: 4.74 MIL/uL (ref 3.87–5.11)
RDW: 12 % (ref 11.5–15.5)
WBC: 4.4 10*3/uL (ref 4.0–10.5)
nRBC: 0 % (ref 0.0–0.2)

## 2020-01-01 LAB — C-REACTIVE PROTEIN: CRP: 0.7 mg/dL (ref <1.0)

## 2020-01-01 LAB — BRAIN NATRIURETIC PEPTIDE: B Natriuretic Peptide: 275.4 pg/mL — ABNORMAL HIGH (ref 0.0–100.0)

## 2020-01-01 LAB — SODIUM: Sodium: 118 mmol/L — CL (ref 135–145)

## 2020-01-01 LAB — D-DIMER, QUANTITATIVE: D-Dimer, Quant: 0.45 ug/mL-FEU (ref 0.00–0.50)

## 2020-01-01 LAB — MAGNESIUM: Magnesium: 1.6 mg/dL — ABNORMAL LOW (ref 1.7–2.4)

## 2020-01-01 MED ORDER — POTASSIUM CHLORIDE CRYS ER 20 MEQ PO TBCR
40.0000 meq | EXTENDED_RELEASE_TABLET | Freq: Once | ORAL | Status: AC
  Administered 2020-01-01: 40 meq via ORAL
  Filled 2020-01-01: qty 2

## 2020-01-01 MED ORDER — TOLVAPTAN 15 MG PO TABS
15.0000 mg | ORAL_TABLET | ORAL | Status: DC
  Administered 2020-01-01 – 2020-01-03 (×3): 15 mg via ORAL
  Filled 2020-01-01 (×3): qty 1

## 2020-01-01 MED ORDER — FUROSEMIDE 10 MG/ML IJ SOLN
20.0000 mg | Freq: Once | INTRAMUSCULAR | Status: AC
  Administered 2020-01-01: 20 mg via INTRAVENOUS
  Filled 2020-01-01: qty 2

## 2020-01-01 MED ORDER — MAGNESIUM SULFATE 2 GM/50ML IV SOLN
2.0000 g | Freq: Once | INTRAVENOUS | Status: AC
  Administered 2020-01-01: 2 g via INTRAVENOUS
  Filled 2020-01-01: qty 50

## 2020-01-01 NOTE — Progress Notes (Signed)
CRITICAL VALUE ALERT  Critical Value:  Sodium 118  Date & Time Notied:  01/01/20 8614  Provider Notified: Dr. Candiss Norse  Orders Received/Actions taken: N/A

## 2020-01-01 NOTE — Progress Notes (Signed)
Physical Therapy Treatment Patient Details Name: Claudia Stein MRN: [id number] DOB: Oct 19, 1933 Today's Date: 01/01/2020    History of Present Illness Pt is a 86 y.o. female with PMH of breast cancer s/p tx, hypothyroidism, HTN, HLD, admitted 12/29/19 with generalized weakness and decreased PO intake, pt had fall going up steps and hit her heaed against stool. Pt sustained large scalp lac with significant bleeding. Incidentally found to be (+) COVID-19; evidence of severe hyponatremia. Pt unvaccinated.   PT Comments    Pt progressing well with mobility. Able to perform multiple standing transfers and increase ambulation distance with RW and min guard. Limited by urinary urgency, frequency and incontinence. Noted plans for pt to return home with family support; recommend HHPT services to maximize functional mobility and independence.  SpO2 94-96% on RA    Follow Up Recommendations  Home health PT;Supervision for mobility/OOB     Equipment Recommendations  3in1 (PT)    Recommendations for Other Services       Precautions / Restrictions Precautions Precautions: Fall;Other (comment) Precaution Comments: Urine incontinence Restrictions Weight Bearing Restrictions: No    Mobility  Bed Mobility Overal bed mobility: Needs Assistance Bed Mobility: Supine to Sit     Supine to sit: Supervision     General bed mobility comments: Cues to stay on task  Transfers Overall transfer level: Needs assistance Equipment used: Rolling walker (2 wheeled) Transfers: Sit to/from Stand Sit to Stand: Min guard         General transfer comment: Performed multiple sit<>stands throughout session from bed, BSC and recliner to RW, min guard for balance  Ambulation/Gait Ambulation/Gait assistance: Min guard Gait Distance (Feet): 90 Feet Assistive device: Rolling walker (2 wheeled) Gait Pattern/deviations: Step-through pattern;Decreased stride length;Trunk flexed   Gait velocity interpretation:  1.31 - 2.62 ft/sec, indicative of limited community ambulator General Gait Details: Cues to maintain closer proximity to RW, min guard for balance; pt initially unaware of urine incontinence, but then contineud to walk anyways repeating, "I think I'm wet"   Stairs             Wheelchair Mobility    Modified Rankin (Stroke Patients Only)       Balance Overall balance assessment: Needs assistance Sitting-balance support: No upper extremity supported;Feet supported Sitting balance-Leahy Scale: Good       Standing balance-Leahy Scale: Fair Standing balance comment: Can static stand without UE support; dynamic stability improved with at least single UE support, assist for doffing underwear and performing pericare                            Cognition Arousal/Alertness: Awake/alert Behavior During Therapy: WFL for tasks assessed/performed Overall Cognitive Status: No family/caregiver present to determine baseline cognitive functioning Area of Impairment: Memory;Attention;Following commands;Safety/judgement;Awareness;Problem solving                   Current Attention Level: Selective Memory: Decreased short-term memory Following Commands: Follows multi-step commands inconsistently Safety/Judgement: Decreased awareness of deficits Awareness: Emergent Problem Solving: Requires verbal cues General Comments: Repetitive in statements although question/statement already answered      Exercises      General Comments General comments (skin integrity, edema, etc.): SpO2 94-96% on RA      Pertinent Vitals/Pain Pain Assessment: No/denies pain    Home Living                      Prior Function  PT Goals (current goals can now be found in the care plan section) Progress towards PT goals: Progressing toward goals    Frequency    Min 3X/week      PT Plan Current plan remains appropriate (needs new purewick)    Co-evaluation               AM-PAC PT "6 Clicks" Mobility   Outcome Measure  Help needed turning from your back to your side while in a flat bed without using bedrails?: None Help needed moving from lying on your back to sitting on the side of a flat bed without using bedrails?: None Help needed moving to and from a bed to a chair (including a wheelchair)?: A Little Help needed standing up from a chair using your arms (e.g., wheelchair or bedside chair)?: A Little Help needed to walk in hospital room?: A Little Help needed climbing 3-5 steps with a railing? : A Little 6 Click Score: 20    End of Session   Activity Tolerance: Patient tolerated treatment well Patient left: in chair;with call bell/phone within reach;with nursing/sitter in room (RN to turn on chair alarm) Nurse Communication: Mobility status PT Visit Diagnosis: Other abnormalities of gait and mobility (R26.89)     Time: 8984-2103 PT Time Calculation (min) (ACUTE ONLY): 31 min  Charges:  $Therapeutic Exercise: 8-22 mins $Therapeutic Activity: 8-22 mins                    Mabeline Caras, PT, DPT Acute Rehabilitation Services  Pager [phone] Office [place] 01/01/2020, 5:44 PM

## 2020-01-01 NOTE — Progress Notes (Signed)
DNR bracelet was placed on pt.

## 2020-01-01 NOTE — Progress Notes (Signed)
PROGRESS NOTE                                                                                                                                                                                                             Patient Demographics:    Claudia Stein, is a 86 y.o. female, DOB - [birth year], LYY:[id number]  Outpatient Primary MD for the patient is Tamsen Roers, MD    LOS - 2  Admit date - 12/29/2019    Chief Complaint  Patient presents with  . Fall  . Dizziness       Brief Narrative - Claudia Stein is a 86 y.o. female with medical history significant of breast cancer status post surgery and treatment, hypothyroidism, hypertension, hyperlipidemia, who has recently been getting weaker at home not eating or drinking adequately has also been withdrawing and failing to thrive, she sustained a fall going up the stairs of her home incurred left scalp laceration and was brought to the ER where the laceration was sutured.  She was found to be incidental positive for COVID-19 infection and also had evidence of severe hyponatremia.  She is not vaccinated for Covid.   Subjective:    Patient in bed, appears comfortable, denies any headache, no fever, no chest pain or pressure, no shortness of breath , no abdominal pain. No focal weakness.     Assessment  & Plan :      1. Incidental Covid positive status -no cough or shortness of breath, CRP stable, she has received Regen - Cov antibody cocktail.  Will obtain baseline chest x-ray and monitor.  Encouraged the patient to sit up in chair in the daytime use I-S and flutter valve for pulmonary toiletry and then prone in bed when at night.  Will advance activity and titrate down oxygen as possible.  SpO2: 92 %  Recent Labs  Lab 12/29/19 1713 12/30/19 0008 12/30/19 0449 12/30/19 0834 12/31/19 0630 01/01/20 0312  WBC 4.3  --  4.6  --  3.4* 4.4  PLT 130*  --  114*  --  106* 117*    CRP  --   --  0.5 0.5 0.9 0.7  AST  --   --  45*  --  33 29  ALT  --   --  29  --  27 26  ALKPHOS  --   --  72  --  77 81  BILITOT  --   --  0.7  --  0.3 0.5  ALBUMIN  --   --  3.2*  --  2.8* 2.8*  DDIMER  --   --  0.52*  --  0.50 0.45  SARSCOV2NAA  --  POSITIVE*  --   --   --   --     2.  Mechanical fall with left scalp laceration.  Laceration has been sutured in the ER, CT head nonacute, PT OT and monitor.  3.  Hyponatremia.  Worse after IV fluids, urine sodium greater than 100, and osmolality greater than serum osmolality with serum uric acid being on the lower side suggesting SIADH, failed fluid restriction to Lasix will give Samsca.  4.  Essential hypertension.  Placed on combination of Norvasc and Coreg.  5.  Dyslipidemia.  On statin continue.  6.  Severe protein calorie malnutrition and failure to thrive.  Protein supplements, PT OT.  7.   Hypothyroidism.  On Synthroid with stable TSH.  Lab Results  Component Value Date   TSH 4.688 (H) 12/30/2019     Condition -   Guarded  Family Communication  : Husband Inocente Salles [phone]  on 12/30/2019 at 9:31 AM message left, son on 01/01/20 - [phone]  Code Status :  Full  Consults  : None  Procedures  :    CT Head.  Left scalp hematoma.  PUD Prophylaxis : None  Disposition Plan  :    Status is: Inpatient  Not inpatient appropriate, will call UM team and downgrade to OBS.   Dispo: The patient is from: Home              Anticipated d/c is to: SNF              Anticipated d/c date is: > 3 days              Patient currently is not medically stable to d/c.   DVT Prophylaxis  :  SCD and  heparin on 12/31/2019  Lab Results  Component Value Date   PLT 117 (L) 01/01/2020    Diet :  Diet Order            Diet regular Room service appropriate? Yes; Fluid consistency: Thin; Fluid restriction: 1200 mL Fluid  Diet effective now                  Inpatient Medications  Scheduled Meds: . amLODipine  10 mg Oral  Daily  . vitamin C  500 mg Oral Daily  . calcium-vitamin D  1 tablet Oral QAC supper  . carvedilol  3.125 mg Oral BID WC  . cholecalciferol  1,000 Units Oral Daily  . feeding supplement  1 Container Oral TID BM  . feeding supplement (ENSURE ENLIVE)  237 mL Oral BID BM  . heparin  5,000 Units Subcutaneous Q8H  . latanoprost  1 drop Both Eyes QHS  . levothyroxine  50 mcg Oral QAC breakfast  . simvastatin  5 mg Oral QHS  . tolvaptan  15 mg Oral Q24H  . vitamin B-12  1,000 mcg Oral Daily   Continuous Infusions: . cefTRIAXone (ROCEPHIN)  IV 1 g (12/31/19 1657)  . famotidine (PEPCID) IV    . magnesium sulfate bolus IVPB 2 g (01/01/20 0941)   PRN Meds:.acetaminophen **OR** [DISCONTINUED] acetaminophen, albuterol, EPINEPHrine, famotidine (PEPCID) IV, [DISCONTINUED] ondansetron **OR** ondansetron (ZOFRAN) IV  Antibiotics  :    Anti-infectives (From  admission, onward)   Start     Dose/Rate Route Frequency Ordered Stop   12/30/19 1600  cefTRIAXone (ROCEPHIN) 1 g in sodium chloride 0.9 % 100 mL IVPB        1 g 200 mL/hr over 30 Minutes Intravenous Daily 12/30/19 0100     12/30/19 0000  cefTRIAXone (ROCEPHIN) 1 g in sodium chloride 0.9 % 100 mL IVPB        1 g 200 mL/hr over 30 Minutes Intravenous  Once 12/29/19 2357 12/30/19 0121       Time Spent in minutes  30   Lala Lund M.D on 01/01/2020 at 10:15 AM  To page go to [email or web address] - password [email or web address]  [facility] -  Office  [phone]    See all Orders from today for further details    Objective:   Vitals:   12/31/19 2000 12/31/19 2100 01/01/20 0537 01/01/20 0924  BP: (!) 141/93 129/62 (!) 146/60 (!) 154/61  Pulse:  61 60 72  Resp: 14 17 18    Temp: 98.4 F (36.9 C) 98.3 F (36.8 C) 98.2 F (36.8 C)   TempSrc: Oral Oral Oral   SpO2: 93% 93% 92%   Weight:      Height:        Wt Readings from Last 3 Encounters:  12/31/19 50.6 kg  02/12/14 58.1 kg  05/30/13 55.9 kg     Intake/Output Summary (Last 24  hours) at 01/01/2020 1015 Last data filed at 01/01/2020 0916 Gross per 24 hour  Intake 940 ml  Output 600 ml  Net 340 ml     Physical Exam  Awake Alert, No new F.N deficits, Normal affect [place].AT, left lateral scalp laceration about 3 cm in length stapled   Supple Neck,No JVD, No cervical lymphadenopathy appriciated.  Symmetrical Chest wall movement, Good air movement bilaterally, CTAB RRR,No Gallops, Rubs or new Murmurs, No Parasternal Heave +ve B.Sounds, Abd Soft, No tenderness, No organomegaly appriciated, No rebound - guarding or rigidity. No Cyanosis, Clubbing or edema, No new Rash or bruise     Data Review:    CBC Recent Labs  Lab 12/29/19 1713 12/30/19 0449 12/31/19 0630 01/01/20 0312  WBC 4.3 4.6 3.4* 4.4  HGB 13.8 13.8 12.9 13.6  HCT 39.2 39.5 36.4 37.9  PLT 130* 114* 106* 117*  MCV 82.4 82.3 81.3 80.0  MCH 29.0 28.8 28.8 28.7  MCHC 35.2 34.9 35.4 35.9  RDW 12.0 12.2 12.1 12.0  LYMPHSABS  --   --  0.6* 1.0  MONOABS  --   --  0.4 0.5  EOSABS  --   --  0.0 0.0  BASOSABS  --   --  0.0 0.0    Recent Labs  Lab 12/29/19 1713 12/30/19 0449 12/30/19 0834 12/31/19 0630 01/01/20 0312  NA 117* 119*  --  117* 116*  K 3.6 3.1*  --  3.6 3.2*  CL 79* 82*  --  80* 78*  CO2 26 26  --  26 28  GLUCOSE 121* 86  --  98 101*  BUN 10 7*  --  6* 15  CREATININE 0.74 0.58  --  0.59 0.70  CALCIUM 8.5* 8.1*  --  8.0* 8.3*  AST  --  45*  --  33 29  ALT  --  29  --  27 26  ALKPHOS  --  72  --  77 81  BILITOT  --  0.7  --  0.3 0.5  ALBUMIN  --  3.2*  --  2.8* 2.8*  MG  --   --  1.0* 1.1* 1.6*  CRP  --  0.5 0.5 0.9 0.7  DDIMER  --  0.52*  --  0.50 0.45  TSH  --  4.688*  --   --   --   BNP  --   --  345.0* 361.1* 275.4*    ------------------------------------------------------------------------------------------------------------------ No results for input(s): CHOL, HDL, LDLCALC, TRIG, CHOLHDL, LDLDIRECT in the last 72 hours.  No results found for:  HGBA1C ------------------------------------------------------------------------------------------------------------------ Recent Labs    12/30/19 0449  TSH 4.688*    Cardiac Enzymes No results for input(s): CKMB, TROPONINI, MYOGLOBIN in the last 168 hours.  Invalid input(s): CK ------------------------------------------------------------------------------------------------------------------    Component Value Date/Time   BNP 275.4 (H) 01/01/2020 1540    Micro Results Recent Results (from the past 240 hour(s))  Urine culture     Status: Abnormal   Collection Time: 12/29/19 11:50 PM   Specimen: Urine, Random  Result Value Ref Range Status   Specimen Description URINE, RANDOM  Final   Special Requests   Final    NONE Performed at [facility], [address], [place], [place] [address]    Culture MULTIPLE SPECIES PRESENT, SUGGEST RECOLLECTION (A)  Final   Report Status 12/31/2019 FINAL  Final  SARS Coronavirus 2 by RT PCR (hospital order, performed in [facility] hospital lab) Nasopharyngeal Nasopharyngeal Swab     Status: Abnormal   Collection Time: 12/30/19 12:08 AM   Specimen: Nasopharyngeal Swab  Result Value Ref Range Status   SARS Coronavirus 2 POSITIVE (A) NEGATIVE Final    Comment: RESULT CALLED TO, READ BACK BY AND VERIFIED WITH: J. Jorge Ny 0126 12/30/2019 T. TYSOR (NOTE) SARS-CoV-2 target nucleic acids are DETECTED  SARS-CoV-2 RNA is generally detectable in upper respiratory specimens  during the acute phase of infection.  Positive results are indicative  of the presence of the identified virus, but do not rule out bacterial infection or co-infection with other pathogens not detected by the test.  Clinical correlation with patient history and  other diagnostic information is necessary to determine patient infection status.  The expected result is negative.  Fact Sheet for Patients:   StrictlyIdeas.no   Fact Sheet for  Healthcare Providers:   BankingDealers.co.za    This test is not yet approved or cleared by the [place] FDA and  has been authorized for detection and/or diagnosis of SARS-CoV-2 by FDA under an Emergency Use Authorization (EUA).  This EUA will remain in effect (meaning thi s test can be used) for the duration of  the COVID-19 declaration under Section 564(b)(1) of the Act, 21 U.S.C. section 360-bbb-3(b)(1), unless the authorization is terminated or revoked sooner.  Performed at [facility], [place] [address], [place], [place] [address]     Radiology Reports CT HEAD WO CONTRAST  Result Date: 12/29/2019 CLINICAL DATA:  Head trauma. Fall this afternoon. Struck left side of head. EXAM: CT HEAD WITHOUT CONTRAST TECHNIQUE: Contiguous axial images were obtained from the base of the skull through the vertex without intravenous contrast. COMPARISON:  None. FINDINGS: Brain: Age related atrophy. Moderate chronic small vessel ischemia. Remote lacunar infarct in the right basal ganglia. No intracranial hemorrhage, mass effect, or midline shift. No hydrocephalus. The basilar cisterns are patent. No evidence of territorial infarct or acute ischemia. No extra-axial or intracranial fluid collection. Vascular: Atherosclerosis of skullbase vasculature without hyperdense vessel or abnormal calcification. Skull: Left frontal scalp hematoma without subjacent fracture. No focal bone  lesion. Sinuses/Orbits: Paranasal sinuses and mastoid air cells are clear. The visualized orbits are unremarkable. Bilateral cataract resection. Other: None. IMPRESSION: 1. Left frontal scalp hematoma. No acute intracranial abnormality. No skull fracture. 2. Age related atrophy and chronic small vessel ischemia. Remote lacunar infarct in the right basal ganglia. Electronically Signed   By: Keith Rake M.D.   On: 12/29/2019 17:32   DG Chest Port 1 View  Result Date: 12/30/2019 CLINICAL DATA:  COVID-19  positive.  Shortness of breath. EXAM: PORTABLE CHEST 1 VIEW COMPARISON:  03/16/2011 FINDINGS: Coarse lung markings are suggestive for chronic changes. No significant airspace disease or lung consolidation. Surgical clips in the left axilla or breast region. Heart size is mildly enlarged. Atherosclerotic calcifications at the aortic arch. Negative for a pneumothorax. IMPRESSION: No acute chest findings. Electronically Signed   By: Markus Daft M.D.   On: 12/30/2019 10:21

## 2020-01-01 NOTE — TOC Initial Note (Addendum)
Transition of Care [facility]) - Initial/Assessment Note    Patient Details  Name: Claudia Stein MRN: [id number] Date of Birth: 1934-03-23  [facility]) CM/SW Contact:    Carles Collet, RN Phone Number: 01/01/2020, 12:49 PM  Clinical Narrative:                 Damaris Schooner w patient on the phone, also spoke w son Ludwig Clarks. Patient lives at home with her son and his wife. They work nights 3 days a week. Her son Ludwig Clarks lives next door.  Patient has a RW at home, but will need a 3/1. Order placed and requested to be brought to room by [employer]. Patient will need HH PT OT (needs orders placed). Discussed HH ratings, patient and family agreeable to any provider. Referral made to [facility], accepted for [facility], will need orders.  Expected Discharge Plan: [place] Barriers to Discharge: Continued Medical Work up   Patient Goals and CMS Choice Patient states their goals for this hospitalization and ongoing recovery are:: to go home CMS Medicare.gov Compare Post Acute Care list provided to:: Other (Comment Required) Choice offered to / list presented to : Adult Children  Expected Discharge Plan and Services Expected Discharge Plan: [place]   Discharge Planning Services: CM Consult Post Acute Care Choice: Durable Medical Equipment, Home Health                   DME Arranged: 3-N-1 DME Agency: [facility] Date DME Agency Contacted: 01/01/20 Time DME Agency Contacted: 92 Representative spoke with at DME Agency: [place] Arranged: PT, OT          Prior Living Arrangements/Services   Lives with:: Adult Children              Current home services: DME    Activities of Daily Living Home Assistive Devices/Equipment: Blood pressure cuff, Walker (specify type) ADL Screening (condition at time of admission) Patient's cognitive ability adequate to safely complete daily activities?: Yes Is the patient deaf or have difficulty hearing?: No Does the  patient have difficulty seeing, even when wearing glasses/contacts?: No Does the patient have difficulty concentrating, remembering, or making decisions?: No Patient able to express need for assistance with ADLs?: Yes Does the patient have difficulty dressing or bathing?: No Independently performs ADLs?: Yes (appropriate for developmental age) Does the patient have difficulty walking or climbing stairs?: No Weakness of Legs: None Weakness of Arms/Hands: None  Permission Sought/Granted                  Emotional Assessment              Admission diagnosis:  Dehydration [E86.0] Hyponatremia [E87.1] SOB (shortness of breath) [R06.02] Acute cystitis without hematuria [N30.00] Laceration of scalp, initial encounter [S01.01XA] Patient Active Problem List   Diagnosis Date Noted  . Essential hypertension 12/30/2019  . Hyperlipemia 12/30/2019  . Acute lower UTI 12/30/2019  . Thrombocytopenia ([place]) 12/30/2019  . FTT (failure to thrive) in adult 12/30/2019  . Hypothyroidism 12/30/2019  . Hyponatremia 12/30/2019  . Fall at home, initial encounter 12/30/2019  . Laceration of scalp 12/30/2019  . COVID-19 virus detected 12/30/2019  . History of breast cancer, left, left mastectomy - 03/22/2011.  T1a, N0. 04/08/2011  . 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 03/04/2011   PCP:  Tamsen Roers, MD Pharmacy:   [facility], [address] [place] [address] Phone: [phone] Fax: [phone]  [facility] [address] ([address]), [place] [address] [place] ([place]) [place] [address] Phone: [phone] Fax: [phone]     Social Determinants of Health (SDOH) Interventions    Readmission Risk Interventions No flowsheet data found.

## 2020-01-01 NOTE — Progress Notes (Addendum)
CRITICAL VALUE ALERT  Critical Value:  Sodium 116  Date & Time Notied:  01/01/2020 04:54  Provider Notified: Hollace Hayward, NP  Orders Received/Actions taken: one time order of Lasix 20 mg ordered at this time.

## 2020-01-02 LAB — MAGNESIUM: Magnesium: 2.3 mg/dL (ref 1.7–2.4)

## 2020-01-02 LAB — COMPREHENSIVE METABOLIC PANEL
ALT: 27 U/L (ref 0–44)
AST: 25 U/L (ref 15–41)
Albumin: 2.9 g/dL — ABNORMAL LOW (ref 3.5–5.0)
Alkaline Phosphatase: 92 U/L (ref 38–126)
Anion gap: 14 (ref 5–15)
BUN: 15 mg/dL (ref 8–23)
CO2: 28 mmol/L (ref 22–32)
Calcium: 9.1 mg/dL (ref 8.9–10.3)
Chloride: 83 mmol/L — ABNORMAL LOW (ref 98–111)
Creatinine, Ser: 0.8 mg/dL (ref 0.44–1.00)
GFR calc Af Amer: >60 mL/min (ref >60)
GFR calc non Af Amer: >60 mL/min (ref >60)
Glucose, Bld: 92 mg/dL (ref 70–99)
Potassium: 3.4 mmol/L — ABNORMAL LOW (ref 3.5–5.1)
Sodium: 125 mmol/L — ABNORMAL LOW (ref 135–145)
Total Bilirubin: 0.6 mg/dL (ref 0.3–1.2)
Total Protein: 6.3 g/dL — ABNORMAL LOW (ref 6.5–8.1)

## 2020-01-02 LAB — D-DIMER, QUANTITATIVE: D-Dimer, Quant: 0.44 ug/mL-FEU (ref 0.00–0.50)

## 2020-01-02 LAB — CBC WITH DIFFERENTIAL/PLATELET
Abs Immature Granulocytes: 0.02 10*3/uL (ref 0.00–0.07)
Basophils Absolute: 0 10*3/uL (ref 0.0–0.1)
Basophils Relative: 0 %
Eosinophils Absolute: 0 10*3/uL (ref 0.0–0.5)
Eosinophils Relative: 0 %
HCT: 39.2 % (ref 36.0–46.0)
Hemoglobin: 14.3 g/dL (ref 12.0–15.0)
Immature Granulocytes: 0 %
Lymphocytes Relative: 19 %
Lymphs Abs: 1 10*3/uL (ref 0.7–4.0)
MCH: 29.6 pg (ref 26.0–34.0)
MCHC: 36.5 g/dL — ABNORMAL HIGH (ref 30.0–36.0)
MCV: 81.2 fL (ref 80.0–100.0)
Monocytes Absolute: 0.6 10*3/uL (ref 0.1–1.0)
Monocytes Relative: 12 %
Neutro Abs: 3.8 10*3/uL (ref 1.7–7.7)
Neutrophils Relative %: 69 %
Platelets: 158 10*3/uL (ref 150–400)
RBC: 4.83 MIL/uL (ref 3.87–5.11)
RDW: 12.2 % (ref 11.5–15.5)
WBC: 5.5 10*3/uL (ref 4.0–10.5)
nRBC: 0 % (ref 0.0–0.2)

## 2020-01-02 LAB — C-REACTIVE PROTEIN: CRP: <0.5 mg/dL (ref <1.0)

## 2020-01-02 LAB — BRAIN NATRIURETIC PEPTIDE: B Natriuretic Peptide: 146.5 pg/mL — ABNORMAL HIGH (ref 0.0–100.0)

## 2020-01-02 MED ORDER — POTASSIUM CHLORIDE CRYS ER 20 MEQ PO TBCR
40.0000 meq | EXTENDED_RELEASE_TABLET | Freq: Once | ORAL | Status: AC
  Administered 2020-01-02: 40 meq via ORAL
  Filled 2020-01-02: qty 2

## 2020-01-02 NOTE — Progress Notes (Signed)
PROGRESS NOTE                                                                                                                                                                                                             Patient Demographics:    Claudia Stein, is a 86 y.o. female, DOB - 1934/04/23, LKJ:[id number]  Outpatient Primary MD for the patient is Tamsen Roers, MD    LOS - 3  Admit date - 12/29/2019    Chief Complaint  Patient presents with  . Fall  . Dizziness       Brief Narrative - Claudia Stein is a 86 y.o. female with medical history significant of breast cancer status post surgery and treatment, hypothyroidism, hypertension, hyperlipidemia, who has recently been getting weaker at home not eating or drinking adequately has also been withdrawing and failing to thrive, she sustained a fall going up the stairs of her home incurred left scalp laceration and was brought to the ER where the laceration was sutured.  She was found to be incidental positive for COVID-19 infection and also had evidence of severe hyponatremia.  She is not vaccinated for Covid.   Subjective:   Patient in bed, appears comfortable, denies any headache, no fever, no chest pain or pressure, no shortness of breath , no abdominal pain. No focal weakness.   Assessment  & Plan :    1. Incidental Covid positive status -no cough or shortness of breath, CRP stable, she has received Regen - Cov antibody cocktail. Remains symptom-free from Covid standpoint.  Encouraged the patient to sit up in chair in the daytime use I-S and flutter valve for pulmonary toiletry and then prone in bed when at night.  Will advance activity and titrate down oxygen as possible.  SpO2: 93 %  Recent Labs  Lab 12/29/19 1713 12/30/19 0008 12/30/19 0449 12/30/19 0834 12/31/19 0630 01/01/20 0312 01/02/20 0350  WBC 4.3  --  4.6  --  3.4* 4.4 5.5  PLT 130*  --  114*  --   106* 117* 158  CRP  --   --  0.5 0.5 0.9 0.7 <0.5  AST  --   --  45*  --  33 29 25  ALT  --   --  29  --  27 26 27   ALKPHOS  --   --  72  --  77 81 92  BILITOT  --   --  0.7  --  0.3 0.5 0.6  ALBUMIN  --   --  3.2*  --  2.8* 2.8* 2.9*  DDIMER  --   --  0.52*  --  0.50 0.45 0.44  SARSCOV2NAA  --  POSITIVE*  --   --   --   --   --     2.  Mechanical fall with left scalp laceration.  Laceration has been sutured in the ER, CT head nonacute, PT OT and monitor.  3.  Hyponatremia.  Worse after IV fluids, urine sodium greater than 100, and osmolality greater than serum osmolality with serum uric acid being on the lower side suggesting SIADH, failed fluid restriction to Lasix , did on Samsca with good effect, continue Samsca and monitor clinically.  4.  Essential hypertension.  Placed on combination of Norvasc and Coreg.  5.  Dyslipidemia.  On statin continue.  6.  Severe protein calorie malnutrition and failure to thrive.  Protein supplements, PT OT.  7.   Hypothyroidism.  On Synthroid with stable TSH.  Lab Results  Component Value Date   TSH 4.688 (H) 12/30/2019     Condition -   Guarded  Family Communication  : Husband Inocente Salles [phone]  on 12/30/2019 at 9:31 AM message left, son on 01/01/20 - [phone]  Code Status :  Full  Consults  : None  Procedures  :    CT Head.  Left scalp hematoma.  PUD Prophylaxis : None  Disposition Plan  :    Status is: Inpatient  Not inpatient appropriate, will call UM team and downgrade to OBS.   Dispo: The patient is from: Home              Anticipated d/c is to: SNF              Anticipated d/c date is: > 3 days              Patient currently is not medically stable to d/c.   DVT Prophylaxis  :  SCD and  heparin on 12/31/2019  Lab Results  Component Value Date   PLT 158 01/02/2020    Diet :  Diet Order            Diet regular Room service appropriate? Yes; Fluid consistency: Thin  Diet effective now                   Inpatient Medications  Scheduled Meds: . amLODipine  10 mg Oral Daily  . vitamin C  500 mg Oral Daily  . calcium-vitamin D  1 tablet Oral QAC supper  . carvedilol  3.125 mg Oral BID WC  . cholecalciferol  1,000 Units Oral Daily  . feeding supplement  1 Container Oral TID BM  . feeding supplement (ENSURE ENLIVE)  237 mL Oral BID BM  . heparin  5,000 Units Subcutaneous Q8H  . latanoprost  1 drop Both Eyes QHS  . levothyroxine  50 mcg Oral QAC breakfast  . simvastatin  5 mg Oral QHS  . tolvaptan  15 mg Oral Q24H  . vitamin B-12  1,000 mcg Oral Daily   Continuous Infusions:  PRN Meds:.acetaminophen **OR** [DISCONTINUED] acetaminophen, [DISCONTINUED] ondansetron **OR** ondansetron (ZOFRAN) IV  Antibiotics  :    Anti-infectives (From admission, onward)   Start     Dose/Rate Route Frequency Ordered Stop   12/30/19 1600  cefTRIAXone (ROCEPHIN) 1  g in sodium chloride 0.9 % 100 mL IVPB  Status:  Discontinued        1 g 200 mL/hr over 30 Minutes Intravenous Daily 12/30/19 0100 01/02/20 0738   12/30/19 0000  cefTRIAXone (ROCEPHIN) 1 g in sodium chloride 0.9 % 100 mL IVPB        1 g 200 mL/hr over 30 Minutes Intravenous  Once 12/29/19 2357 12/30/19 0121       Time Spent in minutes  30   Lala Lund M.D on 01/02/2020 at 12:15 PM  To page go to [email or web address] - password [place]  [facility] -  Office  [phone]    See all Orders from today for further details    Objective:   Vitals:   01/01/20 1631 01/01/20 2115 01/02/20 0505 01/02/20 0810  BP: (!) 146/63 129/73 (!) 132/54 133/74  Pulse: (!) 58 63 (!) 59 75  Resp:  18 14 18   Temp:  98.8 F (37.1 C) 97.8 F (36.6 C) 98.1 F (36.7 C)  TempSrc:  Oral Oral Oral  SpO2:  92% 97% 93%  Weight:      Height:        Wt Readings from Last 3 Encounters:  12/31/19 50.6 kg  02/12/14 58.1 kg  05/30/13 55.9 kg     Intake/Output Summary (Last 24 hours) at 01/02/2020 1215 Last data filed at 01/02/2020 0901 Gross per 24  hour  Intake 920 ml  Output 700 ml  Net 220 ml     Physical Exam  Awake Alert, No new F.N deficits, Normal affect [place].AT, left lateral scalp laceration about 3 cm in length stapled   Supple Neck,No JVD, No cervical lymphadenopathy appriciated.  Symmetrical Chest wall movement, Good air movement bilaterally, CTAB RRR,No Gallops, Rubs or new Murmurs, No Parasternal Heave +ve B.Sounds, Abd Soft, No tenderness, No organomegaly appriciated, No rebound - guarding or rigidity. No Cyanosis, Clubbing or edema, No new Rash or bruise    Data Review:    CBC Recent Labs  Lab 12/29/19 1713 12/30/19 0449 12/31/19 0630 01/01/20 0312 01/02/20 0350  WBC 4.3 4.6 3.4* 4.4 5.5  HGB 13.8 13.8 12.9 13.6 14.3  HCT 39.2 39.5 36.4 37.9 39.2  PLT 130* 114* 106* 117* 158  MCV 82.4 82.3 81.3 80.0 81.2  MCH 29.0 28.8 28.8 28.7 29.6  MCHC 35.2 34.9 35.4 35.9 36.5*  RDW 12.0 12.2 12.1 12.0 12.2  LYMPHSABS  --   --  0.6* 1.0 1.0  MONOABS  --   --  0.4 0.5 0.6  EOSABS  --   --  0.0 0.0 0.0  BASOSABS  --   --  0.0 0.0 0.0    Recent Labs  Lab 12/29/19 1713 12/29/19 1713 12/30/19 0449 12/30/19 0834 12/31/19 0630 01/01/20 0312 01/01/20 1453 01/02/20 0350  NA 117*   < > 119*  --  117* 116* 118* 125*  K 3.6  --  3.1*  --  3.6 3.2*  --  3.4*  CL 79*  --  82*  --  80* 78*  --  83*  CO2 26  --  26  --  26 28  --  28  GLUCOSE 121*  --  86  --  98 101*  --  92  BUN 10  --  7*  --  6* 15  --  15  CREATININE 0.74  --  0.58  --  0.59 0.70  --  0.80  CALCIUM 8.5*  --  8.1*  --  8.0* 8.3*  --  9.1  AST  --   --  45*  --  33 29  --  25  ALT  --   --  29  --  27 26  --  27  ALKPHOS  --   --  72  --  77 81  --  92  BILITOT  --   --  0.7  --  0.3 0.5  --  0.6  ALBUMIN  --   --  3.2*  --  2.8* 2.8*  --  2.9*  MG  --   --   --  1.0* 1.1* 1.6*  --  2.3  CRP  --   --  0.5 0.5 0.9 0.7  --  <0.5  DDIMER  --   --  0.52*  --  0.50 0.45  --  0.44  TSH  --   --  4.688*  --   --   --   --   --   BNP  --   --    --  345.0* 361.1* 275.4*  --  146.5*   < > = values in this interval not displayed.    ------------------------------------------------------------------------------------------------------------------ No results for input(s): CHOL, HDL, LDLCALC, TRIG, CHOLHDL, LDLDIRECT in the last 72 hours.  No results found for: HGBA1C ------------------------------------------------------------------------------------------------------------------ No results for input(s): TSH, T4TOTAL, T3FREE, THYROIDAB in the last 72 hours.  Invalid input(s): FREET3  Cardiac Enzymes No results for input(s): CKMB, TROPONINI, MYOGLOBIN in the last 168 hours.  Invalid input(s): CK ------------------------------------------------------------------------------------------------------------------    Component Value Date/Time   BNP 146.5 (H) 01/02/2020 0350    Micro Results Recent Results (from the past 240 hour(s))  Urine culture     Status: Abnormal   Collection Time: 12/29/19 11:50 PM   Specimen: Urine, Random  Result Value Ref Range Status   Specimen Description URINE, RANDOM  Final   Special Requests   Final    NONE Performed at [facility], [address], [place], [place] [address]    Culture MULTIPLE SPECIES PRESENT, SUGGEST RECOLLECTION (A)  Final   Report Status 12/31/2019 FINAL  Final  SARS Coronavirus 2 by RT PCR (hospital order, performed in [facility] hospital lab) Nasopharyngeal Nasopharyngeal Swab     Status: Abnormal   Collection Time: 12/30/19 12:08 AM   Specimen: Nasopharyngeal Swab  Result Value Ref Range Status   SARS Coronavirus 2 POSITIVE (A) NEGATIVE Final    Comment: RESULT CALLED TO, READ BACK BY AND VERIFIED WITH: J. Jorge Ny 0126 12/30/2019 T. TYSOR (NOTE) SARS-CoV-2 target nucleic acids are DETECTED  SARS-CoV-2 RNA is generally detectable in upper respiratory specimens  during the acute phase of infection.  Positive results are indicative  of the presence of the  identified virus, but do not rule out bacterial infection or co-infection with other pathogens not detected by the test.  Clinical correlation with patient history and  other diagnostic information is necessary to determine patient infection status.  The expected result is negative.  Fact Sheet for Patients:   StrictlyIdeas.no   Fact Sheet for Healthcare Providers:   BankingDealers.co.za    This test is not yet approved or cleared by the [place] FDA and  has been authorized for detection and/or diagnosis of SARS-CoV-2 by FDA under an Emergency Use Authorization (EUA).  This EUA will remain in effect (meaning thi s test can be used) for the duration of  the COVID-19 declaration under Section 564(b)(1) of the Act, 21  U.S.C. section 360-bbb-3(b)(1), unless the authorization is terminated or revoked sooner.  Performed at [facility], [place] [address], [place], [place] [address]     Radiology Reports CT HEAD WO CONTRAST  Result Date: 12/29/2019 CLINICAL DATA:  Head trauma. Fall this afternoon. Struck left side of head. EXAM: CT HEAD WITHOUT CONTRAST TECHNIQUE: Contiguous axial images were obtained from the base of the skull through the vertex without intravenous contrast. COMPARISON:  None. FINDINGS: Brain: Age related atrophy. Moderate chronic small vessel ischemia. Remote lacunar infarct in the right basal ganglia. No intracranial hemorrhage, mass effect, or midline shift. No hydrocephalus. The basilar cisterns are patent. No evidence of territorial infarct or acute ischemia. No extra-axial or intracranial fluid collection. Vascular: Atherosclerosis of skullbase vasculature without hyperdense vessel or abnormal calcification. Skull: Left frontal scalp hematoma without subjacent fracture. No focal bone lesion. Sinuses/Orbits: Paranasal sinuses and mastoid air cells are clear. The visualized orbits are unremarkable. Bilateral cataract  resection. Other: None. IMPRESSION: 1. Left frontal scalp hematoma. No acute intracranial abnormality. No skull fracture. 2. Age related atrophy and chronic small vessel ischemia. Remote lacunar infarct in the right basal ganglia. Electronically Signed   By: Keith Rake M.D.   On: 12/29/2019 17:32   DG Chest Port 1 View  Result Date: 12/30/2019 CLINICAL DATA:  COVID-19 positive.  Shortness of breath. EXAM: PORTABLE CHEST 1 VIEW COMPARISON:  03/16/2011 FINDINGS: Coarse lung markings are suggestive for chronic changes. No significant airspace disease or lung consolidation. Surgical clips in the left axilla or breast region. Heart size is mildly enlarged. Atherosclerotic calcifications at the aortic arch. Negative for a pneumothorax. IMPRESSION: No acute chest findings. Electronically Signed   By: Markus Daft M.D.   On: 12/30/2019 10:21

## 2020-01-02 NOTE — Progress Notes (Signed)
Physical Therapy Treatment Patient Details Name: Claudia Stein MRN: [id number] DOB: 12/19/1933 Today's Date: 01/02/2020    History of Present Illness Pt is a 86 y.o. female with PMH of breast cancer s/p tx, hypothyroidism, HTN, HLD, admitted 12/29/19 with generalized weakness and decreased PO intake, pt had fall going up steps and hit head against stool sustaining large scalp lac with significant bleeding. Incidentally found to be (+) COVID-19; evidence of severe hyponatremia. Pt unvaccinated.   PT Comments    Pt progressing well with mobility. Today's session focused on gait training with RW, pt with improved distance and only requires intermittent min guard for balance. Still demonstrates instability with standing ADL tasks, requiring assist. SpO2 94-96% on RA, HR 60-76. Pt hopeful for return home soon; reports having necessary assist from family. Will continue to follow acutely.    Follow Up Recommendations  Home health PT;Supervision for mobility/OOB     Equipment Recommendations  3in1 (PT)    Recommendations for Other Services       Precautions / Restrictions Precautions Precautions: Fall;Other (comment) Precaution Comments: Urine incontinence - happy to have Depends this session Restrictions Weight Bearing Restrictions: No    Mobility  Bed Mobility Overal bed mobility: Modified Independent Bed Mobility: Supine to Sit              Transfers Overall transfer level: Needs assistance Equipment used: Rolling walker (2 wheeled) Transfers: Sit to/from Stand Sit to Stand: Min guard         General transfer comment: Performed multiple sit<>stands from EOB for doffing mesh underwear and donning Depends  Ambulation/Gait Ambulation/Gait assistance: Min guard Gait Distance (Feet): 520 Feet Assistive device: Rolling walker (2 wheeled) Gait Pattern/deviations: Step-through pattern;Decreased stride length;Trunk flexed   Gait velocity interpretation: 1.31 - 2.62 ft/sec,  indicative of limited community ambulator General Gait Details: Cues to maintain closer proximity to RW, intermittent min guard for balance, stability improving with distance   Stairs             Wheelchair Mobility    Modified Rankin (Stroke Patients Only)       Balance Overall balance assessment: Needs assistance Sitting-balance support: No upper extremity supported;Feet supported Sitting balance-Leahy Scale: Good     Standing balance support: Bilateral upper extremity supported;During functional activity;Single extremity supported Standing balance-Leahy Scale: Fair Standing balance comment: Can static stand without UE support; dynamic stability improved with at least single UE support, assist for doffing underwear and performing pericare                            Cognition Arousal/Alertness: Awake/alert Behavior During Therapy: WFL for tasks assessed/performed Overall Cognitive Status: No family/caregiver present to determine baseline cognitive functioning Area of Impairment: Memory;Attention;Following commands;Awareness;Problem solving                   Current Attention Level: Selective Memory: Decreased short-term memory Following Commands: Follows one step commands consistently;Follows multi-step commands inconsistently   Awareness: Emergent Problem Solving: Requires verbal cues General Comments: "That's the first time I've walked since I've been here"      Exercises      General Comments General comments (skin integrity, edema, etc.): SpO2 94-96% on RA, HR 60-76      Pertinent Vitals/Pain Pain Assessment: No/denies pain    Home Living                      Prior Function  PT Goals (current goals can now be found in the care plan section) Progress towards PT goals: Progressing toward goals    Frequency    Min 3X/week      PT Plan Current plan remains appropriate    Co-evaluation               AM-PAC PT "6 Clicks" Mobility   Outcome Measure  Help needed turning from your back to your side while in a flat bed without using bedrails?: None Help needed moving from lying on your back to sitting on the side of a flat bed without using bedrails?: None Help needed moving to and from a bed to a chair (including a wheelchair)?: A Little Help needed standing up from a chair using your arms (e.g., wheelchair or bedside chair)?: A Little Help needed to walk in hospital room?: A Little Help needed climbing 3-5 steps with a railing? : A Little 6 Click Score: 20    End of Session Equipment Utilized During Treatment: Gait belt Activity Tolerance: Patient tolerated treatment well Patient left: in chair;with call bell/phone within reach;with nursing/sitter in room Nurse Communication: Mobility status PT Visit Diagnosis: Other abnormalities of gait and mobility (R26.89)     Time: 0981-1914 PT Time Calculation (min) (ACUTE ONLY): 18 min  Charges:  $Gait Training: 8-22 mins                    Mabeline Caras, PT, DPT Acute Rehabilitation Services  Pager [phone] Office [place] 01/02/2020, 1:07 PM

## 2020-01-03 LAB — BRAIN NATRIURETIC PEPTIDE: B Natriuretic Peptide: 121.8 pg/mL — ABNORMAL HIGH (ref 0.0–100.0)

## 2020-01-03 LAB — BASIC METABOLIC PANEL
Anion gap: 11 (ref 5–15)
BUN: 23 mg/dL (ref 8–23)
CO2: 29 mmol/L (ref 22–32)
Calcium: 9.7 mg/dL (ref 8.9–10.3)
Chloride: 92 mmol/L — ABNORMAL LOW (ref 98–111)
Creatinine, Ser: 0.88 mg/dL (ref 0.44–1.00)
GFR calc Af Amer: >60 mL/min (ref >60)
GFR calc non Af Amer: >60 mL/min (ref >60)
Glucose, Bld: 120 mg/dL — ABNORMAL HIGH (ref 70–99)
Potassium: 4 mmol/L (ref 3.5–5.1)
Sodium: 132 mmol/L — ABNORMAL LOW (ref 135–145)

## 2020-01-03 LAB — MAGNESIUM: Magnesium: 1.9 mg/dL (ref 1.7–2.4)

## 2020-01-03 MED ORDER — CARVEDILOL 3.125 MG PO TABS
3.1250 mg | ORAL_TABLET | Freq: Two times a day (BID) | ORAL | 0 refills | Status: DC
Start: 2020-01-03 — End: 2021-02-25

## 2020-01-03 MED ORDER — AMLODIPINE BESYLATE 10 MG PO TABS
10.0000 mg | ORAL_TABLET | Freq: Every day | ORAL | 0 refills | Status: DC
Start: 2020-01-03 — End: 2021-02-25

## 2020-01-03 NOTE — Progress Notes (Signed)
Occupational Therapy Treatment Patient Details Name: Claudia Stein MRN: [id number] DOB: 10-01-1933 Today's Date: 01/03/2020    History of present illness Pt is a 86 y.o. female with PMH of breast cancer s/p tx, hypothyroidism, HTN, HLD, admitted 12/29/19 with generalized weakness and decreased PO intake, pt had fall going up steps and hit head against stool sustaining large scalp lac with significant bleeding. Incidentally found to be (+) COVID-19; evidence of severe hyponatremia. Pt unvaccinated.   OT comments  Pt progressing towards established OT goals. Pt performing toileting and hand hygiene with Min Guard A for safety. Pt requiring performing functional mobility in hallway with RW; cues for RW management and hand placement. SpO2 90s on RA. Continue to recommend dc to home with HHOT and will continue to follow acutely as admitted.    Follow Up Recommendations  Home health OT;Supervision/Assistance - 24 hour (pending progress - has to be close to mod I for home)    Equipment Recommendations  3 in 1 bedside commode    Recommendations for Other Services      Precautions / Restrictions Precautions Precautions: Fall;Other (comment) Precaution Comments: Urine incontinence - happy to have Depends this session       Mobility Bed Mobility Overal bed mobility: Needs Assistance Bed Mobility: Supine to Sit     Supine to sit: Supervision     General bed mobility comments: Supervision for safety  Transfers Overall transfer level: Needs assistance Equipment used: Rolling walker (2 wheeled) Transfers: Sit to/from Stand Sit to Stand: Min guard         General transfer comment: MIn Guard A for safety    Balance Overall balance assessment: Needs assistance Sitting-balance support: No upper extremity supported;Feet supported Sitting balance-Leahy Scale: Good       Standing balance-Leahy Scale: Fair Standing balance comment: Can static stand without UE support; dynamic stability  improved with at least single UE support, assist for doffing underwear and performing pericare                           ADL either performed or assessed with clinical judgement   ADL Overall ADL's : Needs assistance/impaired     Grooming: Wash/dry hands;Min guard;Standing Grooming Details (indicate cue type and reason): Min guard A              Lower Body Dressing: Minimal assistance;Sit to/from stand Lower Body Dressing Details (indicate cue type and reason): Min A for bringing underwear over ankles. Able to adjust socks while sitting at EOB.  Toilet Transfer: Min guard;Ambulation;BSC;RW Toilet Transfer Details (indicate cue type and reason): Min Guard A for safety Toileting- Clothing Manipulation and Hygiene: Min guard;Sit to/from stand       Functional mobility during ADLs: Min guard;Rolling walker General ADL Comments: Pt presenting with decreased balance and awareness. Good tolerance to perform toileting, hand hygiene, and mobility in hallway.     Vision       Perception     Praxis      Cognition Arousal/Alertness: Awake/alert Behavior During Therapy: WFL for tasks assessed/performed Overall Cognitive Status: No family/caregiver present to determine baseline cognitive functioning Area of Impairment: Memory;Attention;Following commands;Awareness;Problem solving                   Current Attention Level: Selective Memory: Decreased short-term memory Following Commands: Follows one step commands consistently;Follows multi-step commands inconsistently Safety/Judgement: Decreased awareness of deficits Awareness: Emergent Problem Solving: Requires verbal cues General Comments: Very  eager to participate in therapy.  Presenting with decreased ST memory and awareness.         Exercises     Shoulder Instructions       General Comments SpO2 in 90s on RA    Pertinent Vitals/ Pain       Pain Assessment: No/denies pain  Home Living                                           Prior Functioning/Environment              Frequency  Min 2X/week        Progress Toward Goals  OT Goals(current goals can now be found in the care plan section)  Progress towards OT goals: Progressing toward goals  Acute Rehab OT Goals Patient Stated Goal: Hopes to go home OT Goal Formulation: With patient Time For Goal Achievement: 01/14/20 Potential to Achieve Goals: Good ADL Goals Pt Will Perform Grooming: with modified independence;standing Pt Will Perform Lower Body Bathing: with modified independence;sit to/from stand Pt Will Perform Upper Body Dressing: with modified independence;sitting Pt Will Perform Lower Body Dressing: with modified independence;sit to/from stand Pt Will Transfer to Toilet: with modified independence;ambulating Pt Will Perform Toileting - Clothing Manipulation and hygiene: with modified independence;sit to/from stand Additional ADL Goal #1: Pt will demonstrate anticipatory awareness during functional task.  Plan Discharge plan remains appropriate    Co-evaluation                 AM-PAC OT "6 Clicks" Daily Activity     Outcome Measure   Help from another person eating meals?: None Help from another person taking care of personal grooming?: A Little Help from another person toileting, which includes using toliet, bedpan, or urinal?: A Little Help from another person bathing (including washing, rinsing, drying)?: A Little Help from another person to put on and taking off regular upper body clothing?: A Little Help from another person to put on and taking off regular lower body clothing?: A Little 6 Click Score: 19    End of Session Equipment Utilized During Treatment: Rolling walker  OT Visit Diagnosis: Unsteadiness on feet (R26.81);Muscle weakness (generalized) (M62.81)   Activity Tolerance Patient tolerated treatment well   Patient Left in bed;with call bell/phone within  reach;with bed alarm set   Nurse Communication Mobility status        Time: 5102-5852 OT Time Calculation (min): 32 min  Charges: OT General Charges $OT Visit: 1 Visit OT Treatments $Self Care/Home Management : 23-37 mins  [place], OTR/L Acute Rehab Pager: [phone] Office: [place] 01/03/2020, 1:14 PM

## 2020-01-03 NOTE — TOC Transition Note (Addendum)
Transition of Care [facility]) - CM/SW Discharge Note   Patient Details  Name: Claudia Stein MRN: [id number] Date of Birth: 1933/05/13  Transition of Care [facility] CM/SW Contact:  Verdell Carmine, RN Phone Number: 01/03/2020, 1:53 PM   Clinical Narrative:    Not on oxygen, [facility] for HH,PT and RN ordered, Brittney from [facility] notified.  Patient states that DME( 3:1 ) not in the room, called [facility] to recheck for 3:1. Patient stated she wanted to get back to bed. Called front desk to relay the message.      Barriers to Discharge: Continued Medical Work up   Patient Goals and CMS Choice Patient states their goals for this hospitalization and ongoing recovery are:: to go home CMS Medicare.gov Compare Post Acute Care list provided to:: Other (Comment Required) Choice offered to / list presented to : Adult Children  Discharge Placement                       Discharge Plan and Services   Discharge Planning Services: CM Consult Post Acute Care Choice: Durable Medical Equipment, Home Health          DME Arranged: 3-N-1 DME Agency: [facility] Date DME Agency Contacted: 01/01/20 Time DME Agency Contacted: [id number] Representative spoke with at DME Agency: [place] Arranged: PT, OT          Social Determinants of Health (Bromide) Interventions     Readmission Risk Interventions No flowsheet data found.

## 2020-01-03 NOTE — Discharge Instructions (Signed)
Follow with Primary MD Tamsen Roers, MD in 7 days   Get CBC, CMP, 2 view Chest X ray -  checked next visit within 1 week by Primary MD   Activity: As tolerated with Full fall precautions use walker/cane & assistance as needed  Disposition Home   Diet: Heart Healthy with strict 1.5 L/day total fluid restriction.  Special Instructions: If you have smoked or chewed Tobacco  in the last 2 yrs please stop smoking, stop any regular Alcohol  and or any Recreational drug use.  On your next visit with your primary care physician please Get Medicines reviewed and adjusted.  Please request your Prim.MD to go over all Hospital Tests and Procedure/Radiological results at the follow up, please get all Hospital records sent to your Prim MD by signing hospital release before you go home.  If you experience worsening of your admission symptoms, develop shortness of breath, life threatening emergency, suicidal or homicidal thoughts you must seek medical attention immediately by calling 911 or calling your MD immediately  if symptoms less severe.  You Must read complete instructions/literature along with all the possible adverse reactions/side effects for all the Medicines you take and that have been prescribed to you. Take any new Medicines after you have completely understood and accpet all the possible adverse reactions/side effects.         Person Under Monitoring Name: Claudia Stein  Location: [place] [address]   Infection Prevention Recommendations for Individuals Confirmed to have, or Being Evaluated for, 2019 Novel Coronavirus (COVID-19) Infection Who Receive Care at Home  Individuals who are confirmed to have, or are being evaluated for, COVID-19 should follow the prevention steps below until a healthcare provider or local or state health department says they can return to normal activities.  Stay home except to get medical care You should restrict activities  outside your home, except for getting medical care. Do not go to work, school, or public areas, and do not use public transportation or taxis.  Call ahead before visiting your doctor Before your medical appointment, call the healthcare provider and tell them that you have, or are being evaluated for, COVID-19 infection. This will help the healthcare provider's office take steps to keep other people from getting infected. Ask your healthcare provider to call the local or state health department.  Monitor your symptoms Seek prompt medical attention if your illness is worsening (e.g., difficulty breathing). Before going to your medical appointment, call the healthcare provider and tell them that you have, or are being evaluated for, COVID-19 infection. Ask your healthcare provider to call the local or state health department.  Wear a facemask You should wear a facemask that covers your nose and mouth when you are in the same room with other people and when you visit a healthcare provider. People who live with or visit you should also wear a facemask while they are in the same room with you.  Separate yourself from other people in your home As much as possible, you should stay in a different room from other people in your home. Also, you should use a separate bathroom, if available.  Avoid sharing household items You should not share dishes, drinking glasses, cups, eating utensils, towels, bedding, or other items with other people in your home. After using these items, you should wash them thoroughly with soap and water.  Cover your coughs and sneezes Cover your mouth and nose with a tissue when you cough or  sneeze, or you can cough or sneeze into your sleeve. Throw used tissues in a lined trash can, and immediately wash your hands with soap and water for at least 20 seconds or use an alcohol-based hand rub.  Wash your [facility] your hands often and thoroughly with soap and water for at  least 20 seconds. You can use an alcohol-based hand sanitizer if soap and water are not available and if your hands are not visibly dirty. Avoid touching your eyes, nose, and mouth with unwashed hands.   Prevention Steps for Caregivers and Household Members of Individuals Confirmed to have, or Being Evaluated for, COVID-19 Infection Being Cared for in the Home  If you live with, or provide care at home for, a person confirmed to have, or being evaluated for, COVID-19 infection please follow these guidelines to prevent infection:  Follow healthcare provider's instructions Make sure that you understand and can help the patient follow any healthcare provider instructions for all care.  Provide for the patient's basic needs You should help the patient with basic needs in the home and provide support for getting groceries, prescriptions, and other personal needs.  Monitor the patient's symptoms If they are getting sicker, call his or her medical provider and tell them that the patient has, or is being evaluated for, COVID-19 infection. This will help the healthcare provider's office take steps to keep other people from getting infected. Ask the healthcare provider to call the local or state health department.  Limit the number of people who have contact with the patient  If possible, have only one caregiver for the patient.  Other household members should stay in another home or place of residence. If this is not possible, they should stay  in another room, or be separated from the patient as much as possible. Use a separate bathroom, if available.  Restrict visitors who do not have an essential need to be in the home.  Keep older adults, very young children, and other sick people away from the patient Keep older adults, very young children, and those who have compromised immune systems or chronic health conditions away from the patient. This includes people with chronic heart, lung, or  kidney conditions, diabetes, and cancer.  Ensure good ventilation Make sure that shared spaces in the home have good air flow, such as from an air conditioner or an opened window, weather permitting.  Wash your hands often  Wash your hands often and thoroughly with soap and water for at least 20 seconds. You can use an alcohol based hand sanitizer if soap and water are not available and if your hands are not visibly dirty.  Avoid touching your eyes, nose, and mouth with unwashed hands.  Use disposable paper towels to dry your hands. If not available, use dedicated cloth towels and replace them when they become wet.  Wear a facemask and gloves  Wear a disposable facemask at all times in the room and gloves when you touch or have contact with the patient's blood, body fluids, and/or secretions or excretions, such as sweat, saliva, sputum, nasal mucus, vomit, urine, or feces.  Ensure the mask fits over your nose and mouth tightly, and do not touch it during use.  Throw out disposable facemasks and gloves after using them. Do not reuse.  Wash your hands immediately after removing your facemask and gloves.  If your personal clothing becomes contaminated, carefully remove clothing and launder. Wash your hands after handling contaminated clothing.  Place all used  disposable facemasks, gloves, and other waste in a lined container before disposing them with other household waste.  Remove gloves and wash your hands immediately after handling these items.  Do not share dishes, glasses, or other household items with the patient  Avoid sharing household items. You should not share dishes, drinking glasses, cups, eating utensils, towels, bedding, or other items with a patient who is confirmed to have, or being evaluated for, COVID-19 infection.  After the person uses these items, you should wash them thoroughly with soap and water.  Wash laundry thoroughly  Immediately remove and wash clothes  or bedding that have blood, body fluids, and/or secretions or excretions, such as sweat, saliva, sputum, nasal mucus, vomit, urine, or feces, on them.  Wear gloves when handling laundry from the patient.  Read and follow directions on labels of laundry or clothing items and detergent. In general, wash and dry with the warmest temperatures recommended on the label.  Clean all areas the individual has used often  Clean all touchable surfaces, such as counters, tabletops, doorknobs, bathroom fixtures, toilets, phones, keyboards, tablets, and bedside tables, every day. Also, clean any surfaces that may have blood, body fluids, and/or secretions or excretions on them.  Wear gloves when cleaning surfaces the patient has come in contact with.  Use a diluted bleach solution (e.g., dilute bleach with 1 part bleach and 10 parts water) or a household disinfectant with a label that says EPA-registered for coronaviruses. To make a bleach solution at home, add 1 tablespoon of bleach to 1 quart (4 cups) of water. For a larger supply, add  cup of bleach to 1 gallon (16 cups) of water.  Read labels of cleaning products and follow recommendations provided on product labels. Labels contain instructions for safe and effective use of the cleaning product including precautions you should take when applying the product, such as wearing gloves or eye protection and making sure you have good ventilation during use of the product.  Remove gloves and wash hands immediately after cleaning.  Monitor yourself for signs and symptoms of illness Caregivers and household members are considered close contacts, should monitor their health, and will be asked to limit movement outside of the home to the extent possible. Follow the monitoring steps for close contacts listed on the symptom monitoring form.   ? If you have additional questions, contact your local health department or call the epidemiologist on call at  [phone] (available 24/7). ? This guidance is subject to change. For the most up-to-date guidance from [facility], please refer to their website: [email or web address]

## 2020-01-03 NOTE — Plan of Care (Signed)
  Problem: Education: Goal: Knowledge of General Education information will improve Description: Including pain rating scale, medication(s)/side effects and non-pharmacologic comfort measures Outcome: Adequate for Discharge   Problem: Health Behavior/Discharge Planning: Goal: Ability to manage health-related needs will improve Outcome: Adequate for Discharge   Problem: Clinical Measurements: Goal: Ability to maintain clinical measurements within normal limits will improve Outcome: Adequate for Discharge Goal: Will remain free from infection Outcome: Adequate for Discharge Goal: Diagnostic test results will improve Outcome: Adequate for Discharge Goal: Respiratory complications will improve Outcome: Adequate for Discharge Goal: Cardiovascular complication will be avoided Outcome: Adequate for Discharge   Problem: Activity: Goal: Risk for activity intolerance will decrease Outcome: Adequate for Discharge   Problem: Nutrition: Goal: Adequate nutrition will be maintained Outcome: Adequate for Discharge   Problem: Coping: Goal: Level of anxiety will decrease Outcome: Adequate for Discharge   Problem: Elimination: Goal: Will not experience complications related to bowel motility Outcome: Adequate for Discharge Goal: Will not experience complications related to urinary retention Outcome: Adequate for Discharge   Problem: Pain Managment: Goal: General experience of comfort will improve Outcome: Adequate for Discharge   Problem: Safety: Goal: Ability to remain free from injury will improve Outcome: Adequate for Discharge   Problem: Skin Integrity: Goal: Risk for impaired skin integrity will decrease Outcome: Adequate for Discharge   Problem: Education: Goal: Knowledge of risk factors and measures for prevention of condition will improve Outcome: Adequate for Discharge

## 2020-01-03 NOTE — Discharge Summary (Signed)
Claudia Stein AYT:[id number] DOB: 09/04/1933 DOA: 12/29/2019  PCP: Tamsen Roers, MD  Admit date: 12/29/2019  Discharge date: 01/03/2020  Admitted From: Home   disposition: Home   Recommendations for Outpatient Follow-up:   Follow up with PCP in 1-2 weeks  PCP Please obtain BMP/CBC, 2 view CXR in 1week,  (see Discharge instructions)   PCP Please follow up on the following pending results: Remove staples in her scalp in 10 days, check CBC and BMP along with magnesium next visit.   Home Health: PT, RN Equipment/Devices: Rolling walker Consultations: None  Discharge Condition: Stable    CODE STATUS: DNR  Diet Recommendation: Heart Healthy with 1.5 L/day total fluid restriction  Diet Order            Diet regular Room service appropriate? Yes; Fluid consistency: Thin  Diet effective now                  Chief Complaint  Patient presents with  . Fall  . Dizziness     Brief history of present illness from the day of admission and additional interim summary    Claudia Stein a 86 y.o.femalewith medical history significant ofbreast cancer status post surgery and treatment, hypothyroidism, hypertension, hyperlipidemia, who has recently been getting weaker at home not eating or drinking adequately has also been withdrawing and failing to thrive, she sustained a fall going up the stairs of her home incurred left scalp laceration and was brought to the ER where the laceration was sutured.  She was found to be incidental positive for COVID-19 infection and also had evidence of severe hyponatremia.  She is not vaccinated for Covid.                                                                 Hospital Course    SpO2: 96 %  Recent Labs  Lab 12/30/19 0008 12/30/19 0449 12/30/19 0834 12/31/19 0630  01/01/20 0312 01/02/20 0350 01/03/20 1235  CRP  --  0.5 0.5 0.9 0.7 <0.5  --   DDIMER  --  0.52*  --  0.50 0.45 0.44  --   FERRITIN  --  500*  --   --   --   --   --   BNP  --   --  345.0* 361.1* 275.4* 146.5* 121.8*  SARSCOV2NAA POSITIVE*  --   --   --   --   --   --     Hepatic Function Latest Ref Rng & Units 01/02/2020 01/01/2020 12/31/2019  Total Protein 6.5 - 8.1 g/dL 6.3(L) 6.0(L) 6.1(L)  Albumin 3.5 - 5.0 g/dL 2.9(L) 2.8(L) 2.8(L)  AST 15 - 41 U/L 25 29 33  ALT 0 - 44 U/L _0 Alk Phosphatase 38 - 126 U/L 92  81 77  Total Bilirubin 0.3 - 1.2 mg/dL 0.6 0.5 0.3   2.  Mechanical fall with left scalp laceration.  Laceration has been sutured in the ER, CT head nonacute, per PT OT stable for home discharge with home health PT which has been ordered along with a rolling walker, will follow with PCP for staple removal in 7 to 10 days if site looks stable.  3.  Hyponatremia.  Worse after IV fluids, serum and urine electrolytes and uric acid suggested SIADH, improved after Sam[place]a.  Discharged home with PCP follow-up of BMP and strict fluid restriction of 1.5 L/day.  4.  Essential hypertension.  Placed on combination of Norvasc and Coreg.  5.  Dyslipidemia.  On statin continue.  6.  Severe protein calorie malnutrition and failure to thrive.  Protein supplements were given, PT OT.  7.   Hypothyroidism.  On Synthroid with stable TSH.   Discharge diagnosis     Principal Problem:   Fall at home, initial encounter Active Problems:   History of breast cancer, left, left mastectomy - 03/22/2011.  T1a, N0.   Essential hypertension   Hyperlipemia   Acute lower UTI   Thrombocytopenia (HCC)   FTT (failure to thrive) in adult   Hypothyroidism   Hyponatremia   Laceration of scalp   COVID-19 virus detected    Discharge instructions    Discharge Instructions    Discharge instructions   Complete by: As directed    Follow with Primary MD Tamsen Roers, MD in 7 days   Get  CBC, CMP, 2 view Chest X ray -  checked next visit within 1 week by Primary MD   Activity: As tolerated with Full fall precautions use walker/cane & assistance as needed  Disposition Home   Diet: Heart Healthy with strict 1.5 L/day total fluid restriction.  Special Instructions: If you have smoked or chewed Tobacco  in the last 2 yrs please stop smoking, stop any regular Alcohol  and or any Recreational drug use.  On your next visit with your primary care physician please Get Medicines reviewed and adjusted.  Please request your Prim.MD to go over all Hospital Tests and Procedure/Radiological results at the follow up, please get all Hospital records sent to your Prim MD by signing hospital release before you go home.  If you experience worsening of your admission symptoms, develop shortness of breath, life threatening emergency, suicidal or homicidal thoughts you must seek medical attention immediately by calling 911 or calling your MD immediately  if symptoms less severe.  You Must read complete instructions/literature along with all the possible adverse reactions/side effects for all the Medicines you take and that have been prescribed to you. Take any new Medicines after you have completely understood and accpet all the possible adverse reactions/side effects.   Increase activity slowly   Complete by: As directed       Discharge Medications   Allergies as of 01/03/2020   No Known Allergies     Medication List    STOP taking these medications   hydrochlorothiazide 25 MG tablet Commonly known as: HYDRODIURIL   losartan 25 MG tablet Commonly known as: COZAAR     TAKE these medications   amLODipine 10 MG tablet Commonly known as: NORVASC Take 1 tablet (10 mg total) by mouth daily. What changed:   medication strength  how much to take   CALCIUM PLUS VITAMIN D PO Take 1 tablet by mouth daily.   carvedilol 3.125 MG tablet Commonly known  as: COREG Take 1 tablet (3.125 mg  total) by mouth 2 (two) times daily with a meal.   cholecalciferol 25 MCG (1000 UNIT) tablet Commonly known as: VITAMIN D3 Take 1,000 Units by mouth daily.   latanoprost 0.005 % ophthalmic solution Commonly known as: XALATAN Place 1 drop into both eyes at bedtime.   levothyroxine 50 MCG tablet Commonly known as: SYNTHROID Take 50 mcg by mouth daily before breakfast.   PRESERVISION AREDS 2 PO Take 1 tablet by mouth in the morning and at bedtime.   simvastatin 5 MG tablet Commonly known as: ZOCOR Take 5 mg by mouth at bedtime.   vitamin B-12 1000 MCG tablet Commonly known as: CYANOCOBALAMIN Take 1,000 mcg by mouth daily.   vitamin C 500 MG tablet Commonly known as: ASCORBIC ACID Take 500 mg by mouth daily.            Durable Medical Equipment  (From admission, onward)         Start     Ordered   01/01/20 1238  For home use only DME 3 n 1  Once        01/01/20 1237           [facility].   Specialty: Home Health Services Why: For home health services  Contact information: [place] [address] [place], [facility] Follow up.   Contact information: [address] [place] [address] [phone]        Tamsen Roers, MD. Schedule an appointment as soon as possible for a visit in 1 week(s).   Specialty: Family Medicine Contact information: [address] [place] [address] [phone]               Major procedures and Radiology Reports - PLEASE review detailed and final reports thoroughly  -        CT HEAD WO CONTRAST  Result Date: 12/29/2019 CLINICAL DATA:  Head trauma. Fall this afternoon. Struck left side of head. EXAM: CT HEAD WITHOUT CONTRAST TECHNIQUE: Contiguous axial images were obtained from the base of the skull through the vertex without intravenous contrast. COMPARISON:  None. FINDINGS: Brain: Age  related atrophy. Moderate chronic small vessel ischemia. Remote lacunar infarct in the right basal ganglia. No intracranial hemorrhage, mass effect, or midline shift. No hydrocephalus. The basilar cisterns are patent. No evidence of territorial infarct or acute ischemia. No extra-axial or intracranial fluid collection. Vascular: Atherosclerosis of skullbase vasculature without hyperdense vessel or abnormal calcification. Skull: Left frontal scalp hematoma without subjacent fracture. No focal bone lesion. Sinuses/Orbits: Paranasal sinuses and mastoid air cells are clear. The visualized orbits are unremarkable. Bilateral cataract resection. Other: None. IMPRESSION: 1. Left frontal scalp hematoma. No acute intracranial abnormality. No skull fracture. 2. Age related atrophy and chronic small vessel ischemia. Remote lacunar infarct in the right basal ganglia. Electronically Signed   By: Keith Rake M.D.   On: 12/29/2019 17:32   DG Chest Port 1 View  Result Date: 12/30/2019 CLINICAL DATA:  COVID-19 positive.  Shortness of breath. EXAM: PORTABLE CHEST 1 VIEW COMPARISON:  03/16/2011 FINDINGS: Coarse lung markings are suggestive for chronic changes. No significant airspace disease or lung consolidation. Surgical clips in the left axilla or breast region. Heart size is mildly enlarged. Atherosclerotic calcifications at the aortic arch. Negative for a pneumothorax. IMPRESSION: No acute chest  findings. Electronically Signed   By: Markus Daft M.D.   On: 12/30/2019 10:21    Micro Results     Recent Results (from the past 240 hour(s))  Urine culture     Status: Abnormal   Collection Time: 12/29/19 11:50 PM   Specimen: Urine, Random  Result Value Ref Range Status   Specimen Description URINE, RANDOM  Final   Special Requests   Final    NONE Performed at [facility], [address], [place], [place] [address]    Culture MULTIPLE SPECIES PRESENT, SUGGEST RECOLLECTION (A)  Final   Report Status  12/31/2019 FINAL  Final  SARS Coronavirus 2 by RT PCR (hospital order, performed in [facility] hospital lab) Nasopharyngeal Nasopharyngeal Swab     Status: Abnormal   Collection Time: 12/30/19 12:08 AM   Specimen: Nasopharyngeal Swab  Result Value Ref Range Status   SARS Coronavirus 2 POSITIVE (A) NEGATIVE Final    Comment: RESULT CALLED TO, READ BACK BY AND VERIFIED WITH: J. Jorge Ny 0126 12/30/2019 T. TYSOR (NOTE) SARS-CoV-2 target nucleic acids are DETECTED  SARS-CoV-2 RNA is generally detectable in upper respiratory specimens  during the acute phase of infection.  Positive results are indicative  of the presence of the identified virus, but do not rule out bacterial infection or co-infection with other pathogens not detected by the test.  Clinical correlation with patient history and  other diagnostic information is necessary to determine patient infection status.  The expected result is negative.  Fact Sheet for Patients:   StrictlyIdeas.no   Fact Sheet for Healthcare Providers:   BankingDealers.co.za    This test is not yet approved or cleared by the [place] FDA and  has been authorized for detection and/or diagnosis of SARS-CoV-2 by FDA under an Emergency Use Authorization (EUA).  This EUA will remain in effect (meaning thi s test can be used) for the duration of  the COVID-19 declaration under Section 564(b)(1) of the Act, 21 U.S.C. section 360-bbb-3(b)(1), unless the authorization is terminated or revoked sooner.  Performed at [facility], [place] [address], [place], [place] [address]     Today   Subjective    Bernardina Cacho today has no headache,no chest abdominal pain,no new weakness tingling or numbness, feels much better wants to go home today.    Objective   Blood pressure 119/65, pulse 63, temperature 98.7 F (37.1 C), temperature source Oral, resp. rate 20, height 5' 2" (1.575 m), weight 50.6 kg,  SpO2 96 %.   Intake/Output Summary (Last 24 hours) at 01/03/2020 1441 Last data filed at 01/03/2020 0800 Gross per 24 hour  Intake 540 ml  Output 650 ml  Net -110 ml    Exam  Awake Alert, No new F.N deficits, Normal affect [place].AT, left scalp laceration staples stable, site appears clean, hematoma around left  stable Supple Neck,No JVD, No cervical lymphadenopathy appriciated.  Symmetrical Chest wall movement, Good air movement bilaterally, CTAB RRR,No Gallops,Rubs or new Murmurs, No Parasternal Heave +ve B.Sounds, Abd Soft, Non tender, No organomegaly appriciated, No rebound -guarding or rigidity. No Cyanosis, Clubbing or edema, No new Rash or bruise   Data Review   CBC w Diff:  Lab Results  Component Value Date   WBC 5.5 01/02/2020   HGB 14.3 01/02/2020   HGB 13.3 02/12/2014   HCT 39.2 01/02/2020   HCT 40.2 02/12/2014   PLT 158 01/02/2020   PLT 194 02/12/2014   LYMPHOPCT 19 01/02/2020   LYMPHOPCT  33.2 02/12/2014   MONOPCT 12 01/02/2020   MONOPCT 7.8 02/12/2014   EOSPCT 0 01/02/2020   EOSPCT 1.9 02/12/2014   BASOPCT 0 01/02/2020   BASOPCT 0.6 02/12/2014    CMP:  Lab Results  Component Value Date   NA 132 (L) 01/03/2020   NA 141 02/12/2014   K 4.0 01/03/2020   K 3.8 02/12/2014   CL 92 (L) 01/03/2020   CL 102 01/27/2012   CO2 29 01/03/2020   CO2 31 (H) 02/12/2014   BUN 23 01/03/2020   BUN 8.1 02/12/2014   CREATININE 0.88 01/03/2020   CREATININE 1.0 02/12/2014   PROT 6.3 (L) 01/02/2020   PROT 7.3 02/12/2014   ALBUMIN 2.9 (L) 01/02/2020   ALBUMIN 3.6 02/12/2014   BILITOT 0.6 01/02/2020   BILITOT 0.46 02/12/2014   ALKPHOS 92 01/02/2020   ALKPHOS 95 02/12/2014   AST 25 01/02/2020   AST 16 02/12/2014   ALT 27 01/02/2020   ALT 17 02/12/2014  .   Total Time in preparing paper work, data evaluation and todays exam - 38 minutes  Lala Lund M.D on 01/03/2020 at 2:41 PM  [facility]   Office  [phone]

## 2020-07-31 DIAGNOSIS — Z13228 Encounter for screening for other metabolic disorders: Secondary | ICD-10-CM | POA: Diagnosis not present

## 2020-07-31 DIAGNOSIS — E119 Type 2 diabetes mellitus without complications: Secondary | ICD-10-CM | POA: Diagnosis not present

## 2020-07-31 DIAGNOSIS — Z0000 Encounter for general adult medical examination without abnormal findings: Secondary | ICD-10-CM | POA: Diagnosis not present

## 2020-07-31 DIAGNOSIS — R779 Abnormality of plasma protein, unspecified: Secondary | ICD-10-CM | POA: Diagnosis not present

## 2020-07-31 DIAGNOSIS — Z13220 Encounter for screening for lipoid disorders: Secondary | ICD-10-CM | POA: Diagnosis not present

## 2020-07-31 DIAGNOSIS — R946 Abnormal results of thyroid function studies: Secondary | ICD-10-CM | POA: Diagnosis not present

## 2020-07-31 DIAGNOSIS — I10 Essential (primary) hypertension: Secondary | ICD-10-CM | POA: Diagnosis not present

## 2020-07-31 DIAGNOSIS — R7989 Other specified abnormal findings of blood chemistry: Secondary | ICD-10-CM | POA: Diagnosis not present

## 2020-07-31 DIAGNOSIS — E785 Hyperlipidemia, unspecified: Secondary | ICD-10-CM | POA: Diagnosis not present

## 2020-07-31 DIAGNOSIS — E039 Hypothyroidism, unspecified: Secondary | ICD-10-CM | POA: Diagnosis not present

## 2020-09-19 DIAGNOSIS — H401131 Primary open-angle glaucoma, bilateral, mild stage: Secondary | ICD-10-CM | POA: Diagnosis not present

## 2020-09-19 DIAGNOSIS — H353132 Nonexudative age-related macular degeneration, bilateral, intermediate dry stage: Secondary | ICD-10-CM | POA: Diagnosis not present

## 2020-09-19 DIAGNOSIS — H401132 Primary open-angle glaucoma, bilateral, moderate stage: Secondary | ICD-10-CM | POA: Diagnosis not present

## 2020-12-16 DIAGNOSIS — H409 Unspecified glaucoma: Secondary | ICD-10-CM | POA: Diagnosis not present

## 2020-12-16 DIAGNOSIS — H04129 Dry eye syndrome of unspecified lacrimal gland: Secondary | ICD-10-CM | POA: Diagnosis not present

## 2020-12-16 DIAGNOSIS — E785 Hyperlipidemia, unspecified: Secondary | ICD-10-CM | POA: Diagnosis not present

## 2020-12-16 DIAGNOSIS — H3530 Unspecified macular degeneration: Secondary | ICD-10-CM | POA: Diagnosis not present

## 2020-12-16 DIAGNOSIS — Z823 Family history of stroke: Secondary | ICD-10-CM | POA: Diagnosis not present

## 2020-12-16 DIAGNOSIS — Z008 Encounter for other general examination: Secondary | ICD-10-CM | POA: Diagnosis not present

## 2020-12-16 DIAGNOSIS — E039 Hypothyroidism, unspecified: Secondary | ICD-10-CM | POA: Diagnosis not present

## 2020-12-16 DIAGNOSIS — I10 Essential (primary) hypertension: Secondary | ICD-10-CM | POA: Diagnosis not present

## 2020-12-16 DIAGNOSIS — G8929 Other chronic pain: Secondary | ICD-10-CM | POA: Diagnosis not present

## 2020-12-16 DIAGNOSIS — Z8249 Family history of ischemic heart disease and other diseases of the circulatory system: Secondary | ICD-10-CM | POA: Diagnosis not present

## 2020-12-16 DIAGNOSIS — Z7722 Contact with and (suspected) exposure to environmental tobacco smoke (acute) (chronic): Secondary | ICD-10-CM | POA: Diagnosis not present

## 2021-01-01 ENCOUNTER — Encounter (HOSPITAL_COMMUNITY): Payer: Self-pay | Admitting: Emergency Medicine

## 2021-01-01 ENCOUNTER — Emergency Department (HOSPITAL_COMMUNITY): Payer: Medicare HMO

## 2021-01-01 ENCOUNTER — Other Ambulatory Visit: Payer: Self-pay

## 2021-01-01 ENCOUNTER — Emergency Department (HOSPITAL_COMMUNITY)
Admission: EM | Admit: 2021-01-01 | Discharge: 2021-01-01 | Disposition: A | Discharge status: Home / Self Care | Payer: Medicare HMO | Attending: Emergency Medicine | Admitting: Emergency Medicine

## 2021-01-01 ENCOUNTER — Ambulatory Visit
Admission: EM | Admit: 2021-01-01 | Discharge: 2021-01-01 | Disposition: A | Discharge status: Other Acute Inpatient Hospital | Payer: Medicare HMO | Attending: Internal Medicine | Admitting: Internal Medicine

## 2021-01-01 DIAGNOSIS — R0789 Other chest pain: Secondary | ICD-10-CM

## 2021-01-01 DIAGNOSIS — Z8616 Personal history of COVID-19: Secondary | ICD-10-CM | POA: Diagnosis not present

## 2021-01-01 DIAGNOSIS — R1013 Epigastric pain: Secondary | ICD-10-CM | POA: Insufficient documentation

## 2021-01-01 DIAGNOSIS — R1011 Right upper quadrant pain: Secondary | ICD-10-CM | POA: Insufficient documentation

## 2021-01-01 DIAGNOSIS — R0602 Shortness of breath: Secondary | ICD-10-CM

## 2021-01-01 DIAGNOSIS — E039 Hypothyroidism, unspecified: Secondary | ICD-10-CM | POA: Diagnosis not present

## 2021-01-01 DIAGNOSIS — R030 Elevated blood-pressure reading, without diagnosis of hypertension: Secondary | ICD-10-CM

## 2021-01-01 DIAGNOSIS — I10 Essential (primary) hypertension: Secondary | ICD-10-CM | POA: Insufficient documentation

## 2021-01-01 DIAGNOSIS — Z853 Personal history of malignant neoplasm of breast: Secondary | ICD-10-CM | POA: Diagnosis not present

## 2021-01-01 DIAGNOSIS — R1010 Upper abdominal pain, unspecified: Secondary | ICD-10-CM | POA: Diagnosis not present

## 2021-01-01 DIAGNOSIS — R109 Unspecified abdominal pain: Secondary | ICD-10-CM | POA: Diagnosis not present

## 2021-01-01 DIAGNOSIS — Z79899 Other long term (current) drug therapy: Secondary | ICD-10-CM | POA: Insufficient documentation

## 2021-01-01 DIAGNOSIS — E785 Hyperlipidemia, unspecified: Secondary | ICD-10-CM | POA: Diagnosis not present

## 2021-01-01 LAB — CBC
HCT: 42.5 % (ref 36.0–46.0)
Hemoglobin: 14.4 g/dL (ref 12.0–15.0)
MCH: 30.1 pg (ref 26.0–34.0)
MCHC: 33.9 g/dL (ref 30.0–36.0)
MCV: 88.7 fL (ref 80.0–100.0)
Platelets: 345 10*3/uL (ref 150–400)
RBC: 4.79 MIL/uL (ref 3.87–5.11)
RDW: 12.3 % (ref 11.5–15.5)
WBC: 9.1 10*3/uL (ref 4.0–10.5)
nRBC: 0 % (ref 0.0–0.2)

## 2021-01-01 LAB — COMPREHENSIVE METABOLIC PANEL
ALT: 12 U/L (ref 0–44)
AST: 16 U/L (ref 15–41)
Albumin: 3.3 g/dL — ABNORMAL LOW (ref 3.5–5.0)
Alkaline Phosphatase: 125 U/L (ref 38–126)
Anion gap: 9 (ref 5–15)
BUN: 10 mg/dL (ref 8–23)
CO2: 28 mmol/L (ref 22–32)
Calcium: 10 mg/dL (ref 8.9–10.3)
Chloride: 100 mmol/L (ref 98–111)
Creatinine, Ser: 0.78 mg/dL (ref 0.44–1.00)
GFR, Estimated: >60 mL/min (ref >60)
Glucose, Bld: 90 mg/dL (ref 70–99)
Potassium: 4.1 mmol/L (ref 3.5–5.1)
Sodium: 137 mmol/L (ref 135–145)
Total Bilirubin: 0.6 mg/dL (ref 0.3–1.2)
Total Protein: 7.6 g/dL (ref 6.5–8.1)

## 2021-01-01 LAB — LIPASE, BLOOD: Lipase: 26 U/L (ref 11–51)

## 2021-01-01 LAB — TROPONIN I (HIGH SENSITIVITY)
Troponin I (High Sensitivity): 15 ng/L (ref <18)
Troponin I (High Sensitivity): 13 ng/L (ref <18)

## 2021-01-01 MED ORDER — FAMOTIDINE 20 MG PO TABS: 20.0000 mg | ORAL_TABLET | Freq: Two times a day (BID) | ORAL | 0 refills | Status: DC

## 2021-01-01 MED ORDER — CARVEDILOL 3.125 MG PO TABS: 3.1250 mg | ORAL_TABLET | Freq: Two times a day (BID) | ORAL | Status: DC

## 2021-01-01 NOTE — Discharge Instructions (Addendum)
Please go the hospital soon as you leave urgent care for further evaluation and management.

## 2021-01-01 NOTE — ED Provider Notes (Signed)
[facility] EMERGENCY DEPARTMENT Provider Note   CSN: [id number] Arrival date & time: 01/01/21  1417     History Chief Complaint  Patient presents with   Abdominal Pain    Claudia Stein is a 87 y.o. female.  HPI  87 year old female past medical history of breast cancer status postmastectomy, HTN, HLD presents to the emergency department with concern for epigastric abdominal discomfort.  Patient states that this discomfort happens daily, usually in the evenings after dinner.  She states the pain is like a gnawing/sore sensation.  Last night it was extremely severe, radiating to her back and right shoulder.  She has no associated nausea/vomiting/diarrhea.  No genitourinary symptoms.  She states when she wakes up in the morning that she is slightly sore but otherwise feels well and goes about her day.  This is happened daily for the past week.  She still has her gallbladder, history of appendectomy.  States the only thing that relieves her discomfort is taking aspirin nightly.  No fever.  Past Medical History:  Diagnosis Date   Bruises easily    Cancer ([facility]) 04-16-09, 2012   Breast- bilateral   Hyperlipidemia    Hypertension    PONV (postoperative nausea and vomiting)    Thyroid disease     Patient Active Problem List   Diagnosis Date Noted   Essential hypertension 12/30/2019   Hyperlipemia 12/30/2019   Acute lower UTI 12/30/2019   Thrombocytopenia ([place]) 12/30/2019   FTT (failure to thrive) in adult 12/30/2019   Hypothyroidism 12/30/2019   Hyponatremia 12/30/2019   Fall at home, initial encounter 12/30/2019   Laceration of scalp 12/30/2019   COVID-19 virus detected 12/30/2019   History of breast cancer, left, left mastectomy - 03/22/2011.  T1a, N0. 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     Family History  Problem Relation Age of Onset   Heart disease Mother 17   Cancer Paternal Aunt        colon    Social History   Tobacco Use   Smoking status: Never   Smokeless tobacco: Never  Substance Use Topics   Alcohol use: No   Drug use: No    Home Medications Prior to Admission medications   Medication Sig Start Date End Date Taking? Authorizing Provider  amLODipine (NORVASC) 10 MG tablet Take 1 tablet (10 mg total) by mouth daily. 01/03/20   Thurnell Lose, MD  Calcium Carbonate-Vitamin D (CALCIUM PLUS VITAMIN D PO) Take 1 tablet by mouth daily.      [provider]  carvedilol (COREG) 3.125 MG tablet Take 1 tablet (3.125 mg total) by mouth 2 (two) times daily with a meal. 01/03/20   Thurnell Lose, MD  cholecalciferol (VITAMIN D3) 25 MCG (1000 UNIT) tablet Take 1,000 Units by mouth daily.    [provider]  latanoprost Ivin Poot)  0.005 % ophthalmic solution Place 1 drop into both eyes at bedtime.  12/10/19   [provider]  levothyroxine (SYNTHROID, LEVOTHROID) 50 MCG tablet Take 50 mcg by mouth daily before breakfast.     [provider]  Multiple Vitamins-Minerals (PRESERVISION AREDS 2 PO) Take 1 tablet by mouth in the morning and at bedtime.     [provider]  simvastatin (ZOCOR) 5 MG tablet Take 5 mg by mouth at bedtime.     [provider]  vitamin B-12 (CYANOCOBALAMIN) 1000 MCG tablet Take 1,000 mcg by mouth daily.      [provider]  vitamin C (ASCORBIC ACID) 500 MG tablet Take 500 mg by mouth daily.      [provider]    Allergies    Patient has no known allergies.  Review of Systems   Review of Systems  Constitutional:  Negative for chills and fever.  HENT:  Negative for congestion.   Eyes:  Negative for visual disturbance.  Respiratory:  Negative for shortness of breath.   Cardiovascular:  Negative for chest pain.  Gastrointestinal:  Positive for abdominal pain. Negative for abdominal distention, diarrhea, nausea and vomiting.  Genitourinary:  Negative for dysuria.  Skin:  Negative for rash.  Neurological:  Negative for headaches.   Physical Exam Updated Vital Signs BP (!) 172/75 (BP Location: Right Arm)   Pulse 73   Temp 97.7 F (36.5 C) (Oral)   Resp 18   SpO2 100%   Physical Exam Vitals and nursing note reviewed.  Constitutional:      General: She is not in acute distress.    Appearance: Normal appearance.  HENT:     Head: Normocephalic.     Mouth/Throat:     Mouth: Mucous membranes are moist.  Cardiovascular:     Rate and Rhythm: Normal rate.  Pulmonary:     Effort: Pulmonary effort is normal. No respiratory distress.  Abdominal:     General: There is no distension.     Palpations: Abdomen is soft. There is no pulsatile mass.     Tenderness: There is abdominal tenderness in the epigastric area. There is no guarding or rebound.     Hernia: No hernia is present.  Skin:    General: Skin is warm.  Neurological:     Mental Status: She is alert and oriented to person, place, and time. Mental status is at baseline.  Psychiatric:        Mood and Affect: Mood normal.    ED Results / Procedures / Treatments   Labs (all labs ordered are listed, but only abnormal results are displayed) Labs Reviewed  COMPREHENSIVE METABOLIC PANEL - Abnormal; Notable for the following components:      Result Value   Albumin 3.3 (*)    All other components within normal limits  CBC  LIPASE, BLOOD  TROPONIN I (HIGH SENSITIVITY)  TROPONIN I (HIGH SENSITIVITY)     EKG None  Radiology DG Chest 2 View  Result Date: 01/01/2021 CLINICAL DATA:  Upper abdominal pain. EXAM: CHEST - 2 VIEW COMPARISON:  None. FINDINGS: Normal mediastinum and cardiac silhouette. Normal pulmonary vasculature. No evidence of effusion, infiltrate, or pneumothorax. No acute bony abnormality. IMPRESSION: No acute cardiopulmonary process. Electronically Signed   By: Suzy Bouchard M.D.   On: 01/01/2021 15:40   US Abdomen Limited RUQ (LIVER/GB)  Result Date: 01/01/2021 CLINICAL DATA:  Right upper quadrant pain for 1 week. EXAM: ULTRASOUND ABDOMEN LIMITED RIGHT UPPER QUADRANT COMPARISON:  None. FINDINGS: Gallbladder: No gallstones or wall thickening visualized. No sonographic Murphy sign noted by sonographer. Common bile duct: Diameter: 3 mm. Liver: Nonspecific slight nodularity of left hepatic lobe. No focal lesion identified. Within normal limits in parenchymal echogenicity. Portal vein is patent on color Doppler imaging with normal direction of blood flow towards the liver. Other: None. IMPRESSION: Nonspecific slight nodularity of left hepatic lobe. This finding can be seen in the setting of cirrhosis. Electronically Signed   By: Iven Finn M.D.   On: 01/01/2021 15:21    Procedures Procedures   Medications Ordered in ED Medications  carvedilol (COREG) tablet 3.125 mg (has no administration in time range)    ED Course  I have reviewed the triage vital signs and the nursing notes.  Pertinent labs & imaging results that were available during my care of the patient were reviewed by me and considered in my medical decision making (see chart for details).    MDM Rules/Calculators/A&P                           87 year old female presents the emergency department with epigastric abdominal pain that radiates to her back that happens in the evening for the past 5 days.  Vitals are stable on arrival, abdomen is slightly tender but benign.  Blood work is normal without any acute  abnormalities.  From a cardiac work-up standpoint EKG shows left bundle branch block which is baseline, troponin is negative x2 with no significant delta.  Ultrasound of the gallbladder is unremarkable and ultrasound of the aorta shows no aneurysm.  She has been asymptomatic the entire time being here.  She did become hypertensive but missed her night dose of Coreg which she was administered here.  Low suspicion for acute abdominal pathology, no ongoing symptoms.  She is well-appearing, ambulatory and has been able to eat and drink.  Plan for outpatient follow-up.  Patient at this time appears safe and stable for discharge and will be treated as an outpatient.  Discharge plan and strict return to ED precautions discussed, patient verbalizes understanding and agreement.   Final Clinical Impression(s) / ED Diagnoses Final diagnoses:  RUQ pain    Rx / DC Orders ED Discharge Orders     None        Lorelle Gibbs, DO 01/01/21 2135

## 2021-01-01 NOTE — ED Provider Notes (Signed)
Claudia Stein    CSN: [id number] Arrival date & time: 01/01/21  1234      History   Chief Complaint Chief Complaint  Patient presents with   chest discomfort    HPI Claudia Stein is a 87 y.o. female.   Patient presents with chest pain that has been present for approximately 1 week.  Patient states that pain usually occurs at nighttime around 4 PM.  Usually occurs after dinner but is not associated with breakfast or lunch.  Movement does not exacerbate pain.  Also having some associated shortness of breath that is intermittent as well as some midline back pain.  Has taken aspirin that has helped with relief of pain.  Patient describes pain as "achy".  Pain denies not radiate to arm or any other part of the body.  Denies any headache, blurred vision, nausea, vomiting, any upper respiratory symptoms, fever, diarrhea, abdominal pain, constipation.  States that she does feel "lightheaded" at times.  Denies any cardiac history.  Patient does have history of thyroid disease, hypertension, breast cancer.    Past Medical History:  Diagnosis Date   Bruises easily    Cancer ([place]) 04-16-09, 2012   Breast- bilateral   Hyperlipidemia    Hypertension    PONV (postoperative nausea and vomiting)    Thyroid disease     Patient Active Problem List   Diagnosis Date Noted   Essential hypertension 12/30/2019   Hyperlipemia 12/30/2019   Acute lower UTI 12/30/2019   Thrombocytopenia ([place]) 12/30/2019   FTT (failure to thrive) in adult 12/30/2019   Hypothyroidism 12/30/2019   Hyponatremia 12/30/2019   Fall at home, initial encounter 12/30/2019   Laceration of scalp 12/30/2019   COVID-19 virus detected 12/30/2019   History of breast cancer, left, left mastectomy - 03/22/2011.  T1a, N0. 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      Home Medications    Prior to Admission medications   Medication Sig Start Date End Date Taking? Authorizing Provider  amLODipine (NORVASC) 10 MG tablet Take 1 tablet (10 mg total) by mouth daily. 01/03/20   Thurnell Lose, MD  Calcium Carbonate-Vitamin D (CALCIUM PLUS VITAMIN D PO) Take 1 tablet by mouth daily.      [provider]  carvedilol (COREG) 3.125 MG tablet Take 1 tablet (3.125 mg total) by mouth 2 (two) times daily with a meal. 01/03/20   Thurnell Lose, MD  cholecalciferol (VITAMIN D3) 25 MCG (1000 UNIT) tablet Take 1,000 Units by mouth daily.    [provider]  latanoprost (XALATAN) 0.005 % ophthalmic solution Place 1 drop into both eyes at bedtime.  12/10/19   [provider]  levothyroxine (SYNTHROID, LEVOTHROID) 50 MCG tablet Take 50 mcg by mouth daily before breakfast.     [provider]  Multiple Vitamins-Minerals (PRESERVISION AREDS 2 PO) Take 1 tablet  by mouth in the morning and at bedtime.     [provider]  simvastatin (ZOCOR) 5 MG tablet Take 5 mg by mouth at bedtime.     [provider]  vitamin B-12 (CYANOCOBALAMIN) 1000 MCG tablet Take 1,000 mcg by mouth daily.      [provider]  vitamin C (ASCORBIC ACID) 500 MG tablet Take 500 mg by mouth daily.      [provider]    Family History Family History  Problem Relation Age of Onset   Heart disease Mother 46   Cancer Paternal Aunt        colon    Social History Social History   Tobacco Use   Smoking status: Never   Smokeless tobacco: Never   Substance Use Topics   Alcohol use: No   Drug use: No     Allergies   Patient has no known allergies.   Review of Systems Review of Systems Per HPI  Physical Exam Triage Vital Signs ED Triage Vitals [01/01/21 1244]  Enc Vitals Group     BP (!) 190/82     Pulse Rate 72     Resp 18     Temp 98 F (36.7 C)     Temp Source Oral     SpO2 95 %     Weight      Height      Head Circumference      Peak Flow      Pain Score 0     Pain Loc      Pain Edu?      Excl. in [place]?    No data found.  Updated Vital Signs BP (!) 172/83 (BP Location: Left Arm)   Pulse 72   Temp 98 F (36.7 C) (Oral)   Resp 18   SpO2 95%   Visual Acuity Right Eye Distance:   Left Eye Distance:   Bilateral Distance:    Right Eye Near:   Left Eye Near:    Bilateral Near:     Physical Exam Constitutional:      General: She is not in acute distress.    Appearance: Normal appearance. She is not ill-appearing, toxic-appearing or diaphoretic.  HENT:     Head: Normocephalic and atraumatic.  Eyes:     Extraocular Movements: Extraocular movements intact.     Conjunctiva/sclera: Conjunctivae normal.  Cardiovascular:     Rate and Rhythm: Normal rate and regular rhythm.     Pulses: Normal pulses.     Heart sounds: Normal heart sounds.  Pulmonary:     Effort: Pulmonary effort is normal. No respiratory distress.     Breath sounds: Normal breath sounds. No wheezing or rhonchi.  Chest:     Chest wall: No tenderness.  Abdominal:     General: Abdomen is flat. Bowel sounds are normal. There is no distension.     Palpations: Abdomen is soft.     Tenderness: There is no abdominal tenderness.  Skin:    General: Skin is warm and dry.  Neurological:     General: No focal deficit present.     Mental Status: She is alert and oriented to person, place, and time. Mental status is at baseline.  Psychiatric:        Mood and Affect: Mood normal.        Behavior: Behavior normal.        Thought Content:  Thought content normal.        Judgment:  Judgment normal.     UC Treatments / Results  Labs (all labs ordered are listed, but only abnormal results are displayed) Labs Reviewed - No data to display  EKG   Radiology No results found.  Procedures Procedures (including critical Stein time)  Medications Ordered in UC Medications - No data to display  Initial Impression / Assessment and Plan / UC Course  I have reviewed the triage vital signs and the nursing notes.  Pertinent labs & imaging results that were available during my Stein of the patient were reviewed by me and considered in my medical decision making (see chart for details).     Patient was advised to go to the hospital for further evaluation and management.  EKG was completed that was unremarkable and unchanged from previous EKGs in chart.  Although, patient has new onset chest pain as well as elevated blood pressure and associated shortness of breath.  This requires further work-up that cannot be completed here at the urgent Stein today.  Patient was agreeable with plan.  Suggested the patient go to the hospital via ambulance due to chest pain and elevated blood pressure but patient declined.  Patient has a grandson here that can transport patient to the hospital.  Patient is not in any acute distress so agree with the family member transporting patient to hospital. Final Clinical Impressions(s) / UC Diagnoses   Final diagnoses:  Other chest pain  Elevated blood pressure reading  Shortness of breath     Discharge Instructions      Please go the hospital soon as you leave urgent Stein for further evaluation and management.     ED Prescriptions   None    PDMP not reviewed this encounter.   Odis Luster, FNP 01/01/21 1321

## 2021-01-01 NOTE — Discharge Instructions (Addendum)
You have been seen and discharged from the emergency department.  Your blood work was normal.  Your heart work-up was normal.  The ultrasound of the gallbladder and aorta was normal.  You may be suffering from acid reflux.  Take the medication as directed.  Follow-up with your primary provider for reevaluation and further care. Take home medications as prescribed. If you have any worsening symptoms or further concerns for your health please return to an emergency department for further evaluation.

## 2021-01-01 NOTE — ED Triage Notes (Signed)
Pt c/o chest discomfort and points to her anatomical stomach region. States it occurs after breakfast lunch and dinner. Says "it just hurts" and she takes aspirin at night to help her sleep. Described as achy, denies "pain pain" or burning. Onset last week. Denies pain radiation to any other location, headache. Says "at times" she feels lightheaded.

## 2021-01-01 NOTE — ED Provider Notes (Signed)
Emergency Medicine Provider Triage Evaluation Note  DEKISHA ZEEB , a 87 y.o. female  was evaluated in triage.  Pt complains of right upper quadrant abdominal pain that has been persistent for the past week.  Patient states abdominal pain occurs usually around 4 PM after dinner.  Denies associated nausea, vomiting, diarrhea.  Patient was evaluated urgent care prior to arrival and sent to the ED due to concerns about possible cardiac etiology.  Patient has an intact gallbladder.  History of appendectomy. No chronic NSAIDs or alcohol use. She has been taking ASA for 3 days with improvement in pain.  Review of Systems  Positive: Abdominal pain Negative: nausea  Physical Exam  BP (!) 189/81 (BP Location: Right Arm)   Pulse 78   Temp 97.7 F (36.5 C) (Oral)   Resp 18   SpO2 98%  Gen:   Awake, no distress   Resp:  Normal effort  MSK:   Moves extremities without difficulty  Other:    Medical Decision Making  Medically screening exam initiated at 2:25 PM.  Appropriate orders placed.  Shelbie Proctor was informed that the remainder of the evaluation will be completed by another provider, this initial triage assessment does not replace that evaluation, and the importance of remaining in the ED until their evaluation is complete.  Abdominal labs RUQ [place] to rule out gallbladder etiology Cardiac labs due to UC concern   Karie Kirks 01/01/21 1427    Carmin Muskrat, MD 01/01/21 1701

## 2021-01-01 NOTE — ED Triage Notes (Signed)
Patient complains of epigastric pain that started several days ago and often occurs after eating dinner each day, at which point she takes a '324mg'$  aspirin.

## 2021-02-25 ENCOUNTER — Ambulatory Visit (INDEPENDENT_AMBULATORY_CARE_PROVIDER_SITE_OTHER): Payer: Medicare HMO | Admitting: Family Medicine

## 2021-02-25 ENCOUNTER — Encounter: Payer: Self-pay | Admitting: Family Medicine

## 2021-02-25 ENCOUNTER — Other Ambulatory Visit: Payer: Self-pay

## 2021-02-25 VITALS — BP 173/77 | HR 78 | Temp 98.1°F | Resp 16 | Ht 59.0 in | Wt 108.6 lb

## 2021-02-25 DIAGNOSIS — Z7689 Persons encountering health services in other specified circumstances: Secondary | ICD-10-CM

## 2021-02-25 DIAGNOSIS — R351 Nocturia: Secondary | ICD-10-CM | POA: Diagnosis not present

## 2021-02-25 DIAGNOSIS — E785 Hyperlipidemia, unspecified: Secondary | ICD-10-CM | POA: Diagnosis not present

## 2021-02-25 DIAGNOSIS — R2681 Unsteadiness on feet: Secondary | ICD-10-CM

## 2021-02-25 DIAGNOSIS — E039 Hypothyroidism, unspecified: Secondary | ICD-10-CM | POA: Diagnosis not present

## 2021-02-25 DIAGNOSIS — B3731 Acute candidiasis of vulva and vagina: Secondary | ICD-10-CM

## 2021-02-25 DIAGNOSIS — I10 Essential (primary) hypertension: Secondary | ICD-10-CM

## 2021-02-25 MED ORDER — FLUCONAZOLE 150 MG PO TABS: 150.0000 mg | ORAL_TABLET | Freq: Once | ORAL | 0 refills | Status: AC

## 2021-02-25 MED ORDER — AMLODIPINE BESYLATE 10 MG PO TABS
10.0000 mg | ORAL_TABLET | Freq: Every day | ORAL | 0 refills | Status: DC
Start: 2021-02-25 — End: 2021-03-30

## 2021-02-25 MED ORDER — CARVEDILOL 6.25 MG PO TABS: 6.2500 mg | ORAL_TABLET | Freq: Two times a day (BID) | ORAL | 0 refills | Status: DC

## 2021-02-25 MED ORDER — LEVOTHYROXINE SODIUM 50 MCG PO TABS
50.0000 ug | ORAL_TABLET | Freq: Every day | ORAL | 0 refills | Status: DC
Start: 2021-02-25 — End: 2021-03-30

## 2021-02-25 MED ORDER — SIMVASTATIN 5 MG PO TABS
5.0000 mg | ORAL_TABLET | Freq: Every day | ORAL | 0 refills | Status: DC
Start: 2021-02-25 — End: 2021-03-30

## 2021-02-25 MED ORDER — CLOTRIMAZOLE 1 % EX CREA: 1.0000 "application " | TOPICAL_CREAM | Freq: Two times a day (BID) | CUTANEOUS | 0 refills | Status: DC

## 2021-02-25 NOTE — Progress Notes (Signed)
Patient daughter in- law present Patient family is concern about patient falling a lot. Patient possible has a yeast infection , itching x 2 months.

## 2021-02-25 NOTE — Progress Notes (Signed)
New Patient Office Visit  Subjective:  Patient ID: Claudia Stein, female    DOB: Feb 17, 1934  Age: 87 y.o. MRN: [id number]  CC:  Chief Complaint  Patient presents with   Establish Care    HPI Claudia Stein presents for to establish care.  Patient also desires to review chronic medical conditions and needs refills of medication.  Today patient is complaining is yeast infection vaginal.  Patient also complains of some instability when walking that has resulted in a couple of falls recently.  Patient is here with daughter-in-law who helps with her care.  Past Medical History:  Diagnosis Date   Bruises easily    Cancer ([place]) 04-16-09, 2012   Breast- bilateral   Hyperlipidemia    Hypertension    PONV (postoperative nausea and vomiting)    Thyroid disease     Past Surgical History:  Procedure Laterality Date   APPENDECTOMY     BREAST SURGERY  04-16-09   left  Lumpectomy   FRACTURE SURGERY     broke right ankle in the 80's   MASTECTOMY  November 2011   right breast   MASTECTOMY W/ SENTINEL NODE BIOPSY  03/22/2011   Procedure: MASTECTOMY WITH SENTINEL LYMPH NODE BIOPSY;  Surgeon: Shann Medal, MD;  Location: [facility];  Service: General;  Laterality: Left;  left mastectomy, sential lymph node biopsy, left axilla    Family History  Problem Relation Age of Onset   Heart disease Mother 43   Cancer Paternal Aunt        colon    Social History   Socioeconomic History   Marital status: Married    Spouse name: Not on file   Number of children: Not on file   Years of education: Not on file   Highest education level: Not on file  Occupational History   Not on file  Tobacco Use   Smoking status: Never   Smokeless tobacco: Never  Substance and Sexual Activity   Alcohol use: No   Drug use: No   Sexual activity: Not Currently  Other Topics Concern   Not on file  Social History Narrative   Not on file   Social Determinants of Health   Financial Resource Strain: Not on  file  Food Insecurity: Not on file  Transportation Needs: Not on file  Physical Activity: Not on file  Stress: Not on file  Social Connections: Not on file  Intimate Partner Violence: Not on file    ROS Review of Systems  Genitourinary:  Positive for frequency. Negative for difficulty urinating and dyspareunia.   Objective:   Today's Vitals: BP (!) 173/77 (BP Location: Right Arm, Patient Position: Sitting, Cuff Size: Normal)   Pulse 78   Temp 98.1 F (36.7 C) (Oral)   Resp 16   Ht 4\' 11"  (1.499 m)   Wt 108 lb 9.6 oz (49.3 kg)   SpO2 96%   BMI 21.93 kg/m   Physical Exam Vitals and nursing note reviewed.  Constitutional:      General: She is not in acute distress. Cardiovascular:     Rate and Rhythm: Normal rate and regular rhythm.  Pulmonary:     Effort: Pulmonary effort is normal.     Breath sounds: Normal breath sounds.  Abdominal:     Palpations: Abdomen is soft.     Tenderness: There is no abdominal tenderness.  Musculoskeletal:     Thoracic back: Deformity (kyphosis) present.     Comments: Utilizing walking stick  Neurological:     General: No focal deficit present.     Mental Status: She is alert and oriented to person, place, and time.  Psychiatric:        Mood and Affect: Mood normal.    Assessment & Plan:   1. Vaginal yeast infection Diflucan and Lotrimin cream were prescribed.  2. Gait instability Referral to PT for further evaluation and management. - Ambulatory referral to Physical Therapy  3. Essential hypertension Elevated readings. Will increase carvedilol from 3.125 mg p.o. twice daily to 6.25 mg twice daily and will monitor.  4. Nocturia Discussed scheduled bathroom times.  Will monitor.  5. Hyperlipidemia, unspecified hyperlipidemia type Continue present management.  Meds refilled.  6. Hypothyroidism, unspecified type Appears stable.  Meds refilled.  Plan monitoring labs next office visit.  7. Encounter to establish  care    Outpatient Encounter Medications as of 02/25/2021  Medication Sig   amLODipine (NORVASC) 10 MG tablet Take 1 tablet (10 mg total) by mouth daily.   amLODipine (NORVASC) 5 MG tablet Take 5 mg by mouth daily.   Calcium Carbonate-Vitamin D (CALCIUM PLUS VITAMIN D PO) Take 1 tablet by mouth daily.     carvedilol (COREG) 3.125 MG tablet Take 1 tablet (3.125 mg total) by mouth 2 (two) times daily with a meal.   cholecalciferol (VITAMIN D3) 25 MCG (1000 UNIT) tablet Take 1,000 Units by mouth daily.   famotidine (PEPCID) 20 MG tablet Take 1 tablet (20 mg total) by mouth 2 (two) times daily.   latanoprost (XALATAN) 0.005 % ophthalmic solution Place 1 drop into both eyes at bedtime.    levothyroxine (SYNTHROID, LEVOTHROID) 50 MCG tablet Take 50 mcg by mouth daily before breakfast.    Multiple Vitamins-Minerals (PRESERVISION AREDS 2 PO) Take 1 tablet by mouth in the morning and at bedtime.    simvastatin (ZOCOR) 5 MG tablet Take 5 mg by mouth at bedtime.    vitamin B-12 (CYANOCOBALAMIN) 1000 MCG tablet Take 1,000 mcg by mouth daily.     vitamin C (ASCORBIC ACID) 500 MG tablet Take 500 mg by mouth daily.     No facility-administered encounter medications on file as of 02/25/2021.    Follow-up: Return in about 4 weeks (around 03/25/2021).   Becky Sax, MD

## 2021-03-05 ENCOUNTER — Ambulatory Visit: Payer: Medicare HMO | Attending: Family Medicine | Admitting: Physical Therapy

## 2021-03-05 ENCOUNTER — Other Ambulatory Visit: Payer: Self-pay

## 2021-03-05 DIAGNOSIS — R293 Abnormal posture: Secondary | ICD-10-CM | POA: Insufficient documentation

## 2021-03-05 DIAGNOSIS — R2681 Unsteadiness on feet: Secondary | ICD-10-CM | POA: Insufficient documentation

## 2021-03-05 DIAGNOSIS — M6281 Muscle weakness (generalized): Secondary | ICD-10-CM | POA: Diagnosis not present

## 2021-03-05 DIAGNOSIS — R2689 Other abnormalities of gait and mobility: Secondary | ICD-10-CM | POA: Diagnosis not present

## 2021-03-05 NOTE — Patient Instructions (Signed)
Access Code: [email or web address] URL: [email or web address] Date: 03/05/2021 Prepared by: Mady Haagensen  Exercises Side to Side Weight Shift with Counter Support - 1 x daily - 5 x weekly - 1-2 sets - 10 reps Heel Toe Raises with Counter Support - 1 x daily - 5 x weekly - 1-2 sets - 10 reps

## 2021-03-05 NOTE — Therapy (Signed)
[facility] [address] [place], [place], [address] Phone: [phone]   Fax:  [phone]  Physical Therapy Evaluation  Patient Details  Name: Claudia Stein MRN: [id number] Date of Birth: Aug 17, 1933 Referring Provider (PT): Dorna Mai, MD   Encounter Date: 03/05/2021   PT End of Session - 03/05/21 0952     Visit Number 1    Number of Visits 8    Date for PT Re-Evaluation 04/24/21    Authorization Type [employer]; No visit limit ($30 copay)    Progress Note Due on Visit 10    PT Start Time [phone]    PT Stop Time 1053    PT Time Calculation (min) 60 min    Equipment Utilized During Treatment Gait belt    Activity Tolerance Patient tolerated treatment well    Behavior During Therapy [facility] for tasks assessed/performed             Past Medical History:  Diagnosis Date   Bruises easily    Cancer ([place]) 04-16-09, 2012   Breast- bilateral   Hyperlipidemia    Hypertension    PONV (postoperative nausea and vomiting)    Thyroid disease     Past Surgical History:  Procedure Laterality Date   APPENDECTOMY     BREAST SURGERY  04-16-09   left  Lumpectomy   FRACTURE SURGERY     broke right ankle in the 80's   MASTECTOMY  November 2011   right breast   MASTECTOMY W/ SENTINEL NODE BIOPSY  03/22/2011   Procedure: MASTECTOMY WITH SENTINEL LYMPH NODE BIOPSY;  Surgeon: Shann Medal, MD;  Location: [facility];  Service: General;  Laterality: Left;  left mastectomy, sential lymph node biopsy, left axilla    There were no vitals filed for this visit.    Subjective Assessment - 03/05/21 0959     Subjective I went to the doctor last week for several things, and I cannot walk good, which is why I'm here.  The walking problems have been going on since having Covid in August 2021.  Had a significant fall during that time, and has been doing better.  In the past several months, she has had quite a few falls.  Falls have occurred with  quick turns, changes of directions.  Feels like R leg is weak.  Pt uses cane and has a 3-wheeled RW.  She does report walking in the house without device.    Patient is accompained by: Family member   daughter-in-law   Pertinent History PMH:  breast cancer bilat, hyperlipidemia, HTN, thyroid disease, hx of R ankle fx, L lumpectomy, R mastectomy    Patient Stated Goals Pt's goals for therapy are to walk better and be more stable/sure-footed.    Currently in Pain? No/denies                [facility] PT Assessment - 03/05/21 1006       Assessment   Medical Diagnosis Gait instability    Referring Provider (PT) Dorna Mai, MD    Onset Date/Surgical Date 02/25/21   MD referral   Prior Therapy [facility]PT (late 2021)      Precautions   Precautions Fall      Balance Screen   Has the patient fallen in the past 6 months Yes    How many times? 6-7    Has the patient had a decrease in activity level because of a fear of falling?  Yes  Is the patient reluctant to leave their home because of a fear of falling?  Yes      [place] Private residence    Living Arrangements Alone    Available Help at Discharge Family;Other (Comment)   family next door and someone stays with her at night   Type of Union to enter    Entrance Stairs-Number of Steps 1    Entrance Stairs-Rails Right    Home Layout Other (Comment)   Laundry room is one step down and Shepherd - single point;Other (comment)   3-wheeled RW     Prior Function   Level of Independence Independent    Leisure Enjoys caring for her home and yard      Observation/Other Assessments   Focus on Therapeutic Outcomes (FOTO)  NA      Posture/Postural Control   Posture/Postural Control Postural limitations    Postural Limitations Rounded Shoulders;Forward head;Increased thoracic kyphosis      ROM / Strength   AROM / PROM / Strength Strength      Strength   Overall  Strength Deficits    Overall Strength Comments Grossly tested BLEs 4/5 throughout, except R ankle dorsiflexion 3+/5      Transfers   Transfers Sit to Stand;Stand to Sit    Sit to Stand 4: Min guard;Without upper extremity assist;From chair/3-in-1;4: Min assist    Sit to Stand Details (indicate cue type and reason) pushes chair posteriorly with back of legs several times    Five time sit to stand comments  24.66    Stand to Sit 4: Min guard;4: Min assist;Without upper extremity assist;To chair/3-in-1      Ambulation/Gait   Ambulation/Gait Yes    Ambulation/Gait Assistance 4: Min assist    Ambulation Distance (Feet) 60 Feet    Assistive device Straight cane;1 person hand held assist    Gait Pattern Step-through pattern;Decreased step length - right;Decreased step length - left    Ambulation Surface Level;Indoor    Gait velocity 21.69 sec=1.51 ft/sec      Standardized Balance Assessment   Standardized Balance Assessment Timed Up and Go Test;Berg Balance Test      Berg Balance Test   Sit to Stand Able to stand  independently using hands    Standing Unsupported Able to stand 2 minutes with supervision    Sitting with Back Unsupported but Feet Supported on Floor or Stool Able to sit safely and securely 2 minutes    Stand to Sit Controls descent by using hands    Transfers Able to transfer safely, definite need of hands    Standing Unsupported with Eyes Closed Able to stand 10 seconds with supervision    Standing Unsupported with Feet Together Able to place feet together independently but unable to hold for 30 seconds    From Standing, Reach Forward with Outstretched Arm Reaches forward but needs supervision    From Standing Position, Pick up Object from Floor Able to pick up shoe, needs supervision    From Standing Position, Turn to Look Behind Over each Shoulder Needs supervision when turning    Turn 360 Degrees Needs close supervision or verbal cueing    Standing Unsupported,  Alternately Place Feet on Step/Stool Needs assistance to keep from falling or unable to try    Standing Unsupported, One Foot in Front Able to take small step independently and hold 30 seconds  Standing on One Leg Unable to try or needs assist to prevent fall    Total Score 29    Berg comment: Scores <45/56 indicate increased fall risk.      Timed Up and Go Test   Normal TUG (seconds) 27.25   no cane; 26.96 sec with cane   TUG Comments Scores >13.5 sec indicates increased fall risk.                        Objective measurements completed on examination: See above findings.           Initiated HEP for standing heel/toe raises x 10 reps, cues for UE support and controlled excursion; wide BOS lateral weightshift x 10 reps.     PT Education - 03/05/21 1546     Education Details PT eval results and POC; discussed safety with definite need for using cane at home at all times unless she is using 3-wheeled RW; discussed and practiced option for small rubber quad tip cane.  Initiated HEP    Person(s) Educated Patient;Other (comment)   Daughter-in-law   Methods Explanation;Demonstration;Handout;Verbal cues    Comprehension Verbalized understanding;Returned demonstration              PT Short Term Goals - 03/05/21 1554       PT SHORT TERM GOAL #1   Title Pt will perform HEP with family supervision for improved strength, balance, transfers, and gait.  TARGET 03/27/21    Time 4    Period Weeks    Status New      PT SHORT TERM GOAL #2   Title Pt will improve 5x sit<>stand to less than or equal to 20 sec to demonstrate improved functional strength and transfer efficiency.    Baseline 24.66 sec    Time 4    Period Weeks    Status New      PT SHORT TERM GOAL #3   Title Pt will improve TUG score to less than or equal to 20 sec for decreased fall risk.    Baseline 27.25 sec    Time 4    Period Weeks    Status New      PT SHORT TERM GOAL #4   Title Pt will  verbalize understanding of fall prevention in home environment.    Time 4    Period Weeks    Status New               PT Long Term Goals - 03/05/21 1557       PT LONG TERM GOAL #1   Title Pt will perform progression of HEP with family supervision for improved strength, balance, transfers, and gait.  TARGET 04/24/2021    Time 8    Period Weeks    Status New      PT LONG TERM GOAL #2   Title Pt will improve Berg score to at least 35/56 to decrease fall risk.    Baseline 29/56    Time 8    Period Weeks    Status New      PT LONG TERM GOAL #3   Title Pt will improve gait velocity to at least 1.8 ft/sec for decreased fall risk.    Baseline 1.51 ft/sec    Time 8    Period Weeks    Status New                    Plan - 03/05/21 0865  Clinical Impression Statement Pt is an 87 year old female who presents to OPPT with history of difficulty walking and decreased balance over the past several months.  She has had multiple falls in the past 6 months, uses a cane or 3-wheeled RW, though she does report not using cane at times in home.  She presents with decreased balance, decreased functional strength, abnormal posture, decreased independence and safety with gait.  She has history of falls and is at fall risk per Berg, TUG and gait velocity scores.  She is active around her home and enjoys gardening.  She would benefit from skilled PT to address the above stated deficits to decrease fall risk and improve overall funcitonal mobility.    Personal Factors and Comorbidities Comorbidity 3+    Comorbidities PMH:  breast cancer bilat, hyperlipidemia, HTN, thyroid disease, hx of R ankle fx, L lumpectomy, R mastectomy    Examination-Activity Limitations Locomotion Level;Transfers;Stand;Stairs;Squat    Examination-Participation Restrictions Meal Prep;Cleaning;Community Activity;Yard Work;Laundry    Stability/Clinical Decision Making Evolving/Moderate complexity    Clinical Decision  Making Moderate    Rehab Potential Good    PT Frequency 1x / week    PT Duration 8 weeks   including eval week; PT's recommendation somewhat based on pt's request for as few visits as possible due to copay   PT Treatment/Interventions ADLs/Self Care Home Management;Gait training;Functional mobility training;Therapeutic activities;Therapeutic exercise;Balance training;Neuromuscular re-education;DME Instruction;Patient/family education    PT Next Visit Plan Review HEP, practice cane sequence with appropriate height cane; additional exercises to address balance:  SLS, partial tandem, turning, sit to stand    Consulted and Agree with Plan of Care Patient;Family member/caregiver    Family Member Consulted daughter in law             Patient will benefit from skilled therapeutic intervention in order to improve the following deficits and impairments:  Abnormal gait, Difficulty walking, Decreased balance, Postural dysfunction, Decreased strength, Decreased mobility  Visit Diagnosis: Other abnormalities of gait and mobility  Unsteadiness on feet  Muscle weakness (generalized)  Abnormal posture     Problem List Patient Active Problem List   Diagnosis Date Noted   Essential hypertension 12/30/2019   Hyperlipemia 12/30/2019   Acute lower UTI 12/30/2019   Thrombocytopenia ([place]) 12/30/2019   FTT (failure to thrive) in adult 12/30/2019   Hypothyroidism 12/30/2019   Hyponatremia 12/30/2019   Fall at home, initial encounter 12/30/2019   Laceration of scalp 12/30/2019   COVID-19 virus detected 12/30/2019   History of breast cancer, left, left mastectomy - 03/22/2011.  T1a, N0. 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 03/04/2011    Aryahna Spagna W., PT 03/05/2021, 4:02 PM  [facility] [address] [place], [place], [address] Phone: [phone]   Fax:  [phone]  Name: Claudia Stein MRN: [id number] Date of Birth:  Dec 08, 1933

## 2021-03-10 ENCOUNTER — Ambulatory Visit: Payer: Self-pay | Admitting: Physical Therapy

## 2021-03-17 ENCOUNTER — Ambulatory Visit: Payer: Medicare HMO | Admitting: Physical Therapy

## 2021-03-21 ENCOUNTER — Other Ambulatory Visit: Payer: Self-pay | Admitting: Family Medicine

## 2021-03-24 ENCOUNTER — Other Ambulatory Visit: Payer: Self-pay | Admitting: Family Medicine

## 2021-03-24 ENCOUNTER — Ambulatory Visit: Payer: Self-pay | Admitting: Physical Therapy

## 2021-03-30 ENCOUNTER — Ambulatory Visit (INDEPENDENT_AMBULATORY_CARE_PROVIDER_SITE_OTHER): Payer: Medicare HMO | Admitting: Family Medicine

## 2021-03-30 ENCOUNTER — Encounter: Payer: Self-pay | Admitting: Family Medicine

## 2021-03-30 ENCOUNTER — Other Ambulatory Visit: Payer: Self-pay

## 2021-03-30 VITALS — BP 138/61 | HR 62 | Temp 98.0°F | Resp 16 | Wt 113.0 lb

## 2021-03-30 DIAGNOSIS — I10 Essential (primary) hypertension: Secondary | ICD-10-CM

## 2021-03-30 DIAGNOSIS — E785 Hyperlipidemia, unspecified: Secondary | ICD-10-CM | POA: Diagnosis not present

## 2021-03-30 DIAGNOSIS — E039 Hypothyroidism, unspecified: Secondary | ICD-10-CM | POA: Diagnosis not present

## 2021-03-30 MED ORDER — AMLODIPINE BESYLATE 10 MG PO TABS: 10.0000 mg | ORAL_TABLET | Freq: Every day | ORAL | 1 refills | Status: DC

## 2021-03-30 MED ORDER — SIMVASTATIN 5 MG PO TABS: 5.0000 mg | ORAL_TABLET | Freq: Every day | ORAL | 1 refills | Status: DC

## 2021-03-30 MED ORDER — CARVEDILOL 6.25 MG PO TABS: 6.2500 mg | ORAL_TABLET | Freq: Two times a day (BID) | ORAL | 1 refills | Status: DC

## 2021-03-30 MED ORDER — LEVOTHYROXINE SODIUM 50 MCG PO TABS: 50.0000 ug | ORAL_TABLET | Freq: Every day | ORAL | 1 refills | Status: DC

## 2021-03-30 NOTE — Progress Notes (Signed)
Patient is here for checked on here blood pressure .

## 2021-03-30 NOTE — Progress Notes (Signed)
Established Patient Office Visit  Subjective:  Patient ID: Claudia Stein, female    DOB: 1933-09-24  Age: 87 y.o. MRN: [id number]  CC:  Chief Complaint  Patient presents with   Follow-up   Hypertension    HPI TALOR DESROSIERS presents for follow up of hypertension. Denies acute complaints or concerns.   Past Medical History:  Diagnosis Date   Bruises easily    Cancer ([place]) 04-16-09, 2012   Breast- bilateral   Hyperlipidemia    Hypertension    PONV (postoperative nausea and vomiting)    Thyroid disease     Past Surgical History:  Procedure Laterality Date   APPENDECTOMY     BREAST SURGERY  04-16-09   left  Lumpectomy   FRACTURE SURGERY     broke right ankle in the 80's   MASTECTOMY  November 2011   right breast   MASTECTOMY W/ SENTINEL NODE BIOPSY  03/22/2011   Procedure: MASTECTOMY WITH SENTINEL LYMPH NODE BIOPSY;  Surgeon: Shann Medal, MD;  Location: [facility];  Service: General;  Laterality: Left;  left mastectomy, sential lymph node biopsy, left axilla    Family History  Problem Relation Age of Onset   Heart disease Mother 75   Cancer Paternal Aunt        colon    Social History   Socioeconomic History   Marital status: Married    Spouse name: Not on file   Number of children: Not on file   Years of education: Not on file   Highest education level: Not on file  Occupational History   Not on file  Tobacco Use   Smoking status: Never   Smokeless tobacco: Never  Substance and Sexual Activity   Alcohol use: No   Drug use: No   Sexual activity: Not Currently  Other Topics Concern   Not on file  Social History Narrative   Not on file   Social Determinants of Health   Financial Resource Strain: Not on file  Food Insecurity: Not on file  Transportation Needs: Not on file  Physical Activity: Not on file  Stress: Not on file  Social Connections: Not on file  Intimate Partner Violence: Not on file    ROS Review of Systems  All other systems  reviewed and are negative.  Objective:   Today's Vitals: BP 138/61   Pulse 62   Temp 98 F (36.7 C) (Oral)   Resp 16   Wt 113 lb (51.3 kg)   SpO2 97%   BMI 22.82 kg/m   Physical Exam Vitals and nursing note reviewed.  Constitutional:      General: She is not in acute distress. Cardiovascular:     Rate and Rhythm: Normal rate and regular rhythm.  Pulmonary:     Effort: Pulmonary effort is normal.     Breath sounds: Normal breath sounds.  Abdominal:     Palpations: Abdomen is soft.     Tenderness: There is no abdominal tenderness.  Musculoskeletal:     Thoracic back: Deformity (kyphosis) present.     Comments: Utilizing walking cane  Neurological:     General: No focal deficit present.     Mental Status: She is alert and oriented to person, place, and time.  Psychiatric:        Mood and Affect: Mood normal.    Assessment & Plan:   1. Essential hypertension Much improved and at goal. Continue present management and monitor. Meds refilled.   2. Hyperlipidemia,  unspecified hyperlipidemia type Continue present management. Meds refilled.   3. Hypothyroidism, unspecified type Appears stable. Continue present management. Meds refilled   Outpatient Encounter Medications as of 03/30/2021  Medication Sig   Calcium Carbonate-Vitamin D (CALCIUM PLUS VITAMIN D PO) Take 1 tablet by mouth daily.     cholecalciferol (VITAMIN D3) 25 MCG (1000 UNIT) tablet Take 1,000 Units by mouth daily.   clotrimazole (LOTRIMIN) 1 % cream Apply 1 application topically 2 (two) times daily.   latanoprost (XALATAN) 0.005 % ophthalmic solution Place 1 drop into both eyes at bedtime.    Multiple Vitamins-Minerals (PRESERVISION AREDS 2 PO) Take 1 tablet by mouth in the morning and at bedtime.    vitamin B-12 (CYANOCOBALAMIN) 1000 MCG tablet Take 1,000 mcg by mouth daily.     vitamin C (ASCORBIC ACID) 500 MG tablet Take 500 mg by mouth daily.     [DISCONTINUED] amLODipine (NORVASC) 10 MG tablet Take 1  tablet (10 mg total) by mouth daily.   [DISCONTINUED] carvedilol (COREG) 6.25 MG tablet TAKE 1 TABLET BY MOUTH TWICE DAILY WITH A MEAL   [DISCONTINUED] levothyroxine (SYNTHROID) 50 MCG tablet Take 1 tablet (50 mcg total) by mouth daily before breakfast.   [DISCONTINUED] simvastatin (ZOCOR) 5 MG tablet Take 1 tablet (5 mg total) by mouth at bedtime.   amLODipine (NORVASC) 10 MG tablet Take 1 tablet (10 mg total) by mouth daily.   carvedilol (COREG) 6.25 MG tablet Take 1 tablet (6.25 mg total) by mouth 2 (two) times daily with a meal.   levothyroxine (SYNTHROID) 50 MCG tablet Take 1 tablet (50 mcg total) by mouth daily before breakfast.   simvastatin (ZOCOR) 5 MG tablet Take 1 tablet (5 mg total) by mouth at bedtime.   No facility-administered encounter medications on file as of 03/30/2021.    Follow-up: Return in about 3 months (around 06/30/2021) for follow up, chronic med issues.   Becky Sax, MD

## 2021-04-09 ENCOUNTER — Other Ambulatory Visit: Payer: Self-pay | Admitting: Family Medicine

## 2021-04-15 ENCOUNTER — Other Ambulatory Visit: Payer: Self-pay | Admitting: Family Medicine

## 2021-04-17 ENCOUNTER — Other Ambulatory Visit: Payer: Self-pay | Admitting: Family Medicine

## 2021-05-08 ENCOUNTER — Encounter: Payer: Self-pay | Admitting: Physical Therapy

## 2021-05-08 NOTE — Therapy (Signed)
[facility] [address], [place], [place], [address] Phone: [phone]   Fax:  [phone]  Patient Details  Name: Claudia Stein MRN: [id number] Date of Birth: 1934/04/17 Referring Provider:  Dorna Mai, MD  Encounter Date: 05/08/2021  PHYSICAL THERAPY DISCHARGE SUMMARY  Visits from Start of Care: 1  Current functional level related to goals / functional outcomes: See eval (pt did not return after PT evaluation)   Remaining deficits: See eval   Education / Equipment: Not addressed, as pt did not return after eval.   Patient agrees to discharge. Patient goals were not met. Patient is being discharged due to not returning since the last visit.   Mykenna Viele W., PT 05/08/2021, 9:44 AM  [facility] [address], [place], [place], [address] Phone: [phone]   Fax:  [phone]

## 2021-06-30 ENCOUNTER — Other Ambulatory Visit: Payer: Self-pay

## 2021-06-30 ENCOUNTER — Encounter: Payer: Self-pay | Admitting: Family Medicine

## 2021-06-30 ENCOUNTER — Ambulatory Visit (INDEPENDENT_AMBULATORY_CARE_PROVIDER_SITE_OTHER): Payer: Medicare HMO | Admitting: Family Medicine

## 2021-06-30 VITALS — BP 147/75 | HR 62 | Temp 97.7°F | Resp 16 | Wt 117.0 lb

## 2021-06-30 DIAGNOSIS — E039 Hypothyroidism, unspecified: Secondary | ICD-10-CM

## 2021-06-30 DIAGNOSIS — I10 Essential (primary) hypertension: Secondary | ICD-10-CM

## 2021-06-30 DIAGNOSIS — E785 Hyperlipidemia, unspecified: Secondary | ICD-10-CM

## 2021-06-30 MED ORDER — AMLODIPINE BESYLATE 10 MG PO TABS: 10.0000 mg | ORAL_TABLET | Freq: Every day | ORAL | 1 refills | Status: DC

## 2021-06-30 MED ORDER — CARVEDILOL 12.5 MG PO TABS: 12.5000 mg | ORAL_TABLET | Freq: Two times a day (BID) | ORAL | 1 refills | Status: DC

## 2021-06-30 MED ORDER — LEVOTHYROXINE SODIUM 50 MCG PO TABS: 50.0000 ug | ORAL_TABLET | Freq: Every day | ORAL | 1 refills | Status: DC

## 2021-06-30 MED ORDER — SIMVASTATIN 5 MG PO TABS: 5.0000 mg | ORAL_TABLET | Freq: Every day | ORAL | 1 refills | Status: DC

## 2021-06-30 NOTE — Progress Notes (Signed)
Patient is here for HTN follow-up. Patient said that her bp has been running a little high fir a while and would like to talk with provider

## 2021-07-01 ENCOUNTER — Encounter: Payer: Self-pay | Admitting: Family Medicine

## 2021-07-01 NOTE — Progress Notes (Signed)
Established Patient Office Visit  Subjective:  Patient ID: Claudia Stein, female    DOB: 06-08-1933  Age: 88 y.o. MRN: [id number]  CC:  Chief Complaint  Patient presents with   Follow-up   Hypertension    HPI Claudia Stein presents for follow up of chronic med issues including hypertension and hypothyroid. Patient denies acute complaints or concerns.   Past Medical History:  Diagnosis Date   Bruises easily    Cancer ([place]) 04-16-09, 2012   Breast- bilateral   Hyperlipidemia    Hypertension    PONV (postoperative nausea and vomiting)    Thyroid disease     Past Surgical History:  Procedure Laterality Date   APPENDECTOMY     BREAST SURGERY  04-16-09   left  Lumpectomy   FRACTURE SURGERY     broke right ankle in the 80's   MASTECTOMY  November 2011   right breast   MASTECTOMY W/ SENTINEL NODE BIOPSY  03/22/2011   Procedure: MASTECTOMY WITH SENTINEL LYMPH NODE BIOPSY;  Surgeon: Shann Medal, MD;  Location: [facility];  Service: General;  Laterality: Left;  left mastectomy, sential lymph node biopsy, left axilla    Family History  Problem Relation Age of Onset   Heart disease Mother 2   Cancer Paternal Aunt        colon    Social History   Socioeconomic History   Marital status: Married    Spouse name: Not on file   Number of children: Not on file   Years of education: Not on file   Highest education level: Not on file  Occupational History   Not on file  Tobacco Use   Smoking status: Never   Smokeless tobacco: Never  Substance and Sexual Activity   Alcohol use: No   Drug use: No   Sexual activity: Not Currently  Other Topics Concern   Not on file  Social History Narrative   Not on file   Social Determinants of Health   Financial Resource Strain: Not on file  Food Insecurity: Not on file  Transportation Needs: Not on file  Physical Activity: Not on file  Stress: Not on file  Social Connections: Not on file  Intimate Partner Violence: Not on file     ROS Review of Systems  All other systems reviewed and are negative.  Objective:   Today's Vitals: BP (!) 147/75    Pulse 62    Temp 97.7 F (36.5 C) (Oral)    Resp 16    Wt 117 lb (53.1 kg)    SpO2 96%    BMI 23.63 kg/m   Physical Exam Vitals and nursing note reviewed.  Constitutional:      General: She is not in acute distress. Cardiovascular:     Rate and Rhythm: Normal rate and regular rhythm.  Pulmonary:     Effort: Pulmonary effort is normal.     Breath sounds: Normal breath sounds.  Abdominal:     Palpations: Abdomen is soft.     Tenderness: There is no abdominal tenderness.  Musculoskeletal:     Thoracic back: Deformity (kyphosis) present.     Comments: Utilizing walking cane  Neurological:     General: No focal deficit present.     Mental Status: She is alert and oriented to person, place, and time.  Psychiatric:        Mood and Affect: Mood normal.    Assessment & Plan:   1. Essential hypertension Slightly elevated  readings. Will increase coreg to 12.5 mg from 6.25 and monitor  2. Hyperlipidemia, unspecified hyperlipidemia type Continue present management  3. Hypothyroidism, unspecified type Appears stable with present management. Continue. Meds refilled.     Outpatient Encounter Medications as of 06/30/2021  Medication Sig   Calcium Carbonate-Vitamin D (CALCIUM PLUS VITAMIN D PO) Take 1 tablet by mouth daily.     carvedilol (COREG) 12.5 MG tablet Take 1 tablet (12.5 mg total) by mouth 2 (two) times daily with a meal.   cholecalciferol (VITAMIN D3) 25 MCG (1000 UNIT) tablet Take 1,000 Units by mouth daily.   clotrimazole (LOTRIMIN) 1 % cream Apply 1 application topically 2 (two) times daily.   latanoprost (XALATAN) 0.005 % ophthalmic solution Place 1 drop into both eyes at bedtime.    Multiple Vitamins-Minerals (PRESERVISION AREDS 2 PO) Take 1 tablet by mouth in the morning and at bedtime.    vitamin B-12 (CYANOCOBALAMIN) 1000 MCG tablet Take 1,000  mcg by mouth daily.     vitamin C (ASCORBIC ACID) 500 MG tablet Take 500 mg by mouth daily.     [DISCONTINUED] amLODipine (NORVASC) 10 MG tablet Take 1 tablet by mouth once daily   [DISCONTINUED] carvedilol (COREG) 6.25 MG tablet TAKE 1 TABLET BY MOUTH TWICE DAILY WITH A MEAL   [DISCONTINUED] levothyroxine (SYNTHROID) 50 MCG tablet Take 1 tablet (50 mcg total) by mouth daily before breakfast.   [DISCONTINUED] simvastatin (ZOCOR) 5 MG tablet Take 1 tablet (5 mg total) by mouth at bedtime.   amLODipine (NORVASC) 10 MG tablet Take 1 tablet (10 mg total) by mouth daily.   levothyroxine (SYNTHROID) 50 MCG tablet Take 1 tablet (50 mcg total) by mouth daily before breakfast.   simvastatin (ZOCOR) 5 MG tablet Take 1 tablet (5 mg total) by mouth at bedtime.   No facility-administered encounter medications on file as of 06/30/2021.    Follow-up: Return in about 3 months (around 09/27/2021).   Becky Sax, MD

## 2021-09-07 ENCOUNTER — Other Ambulatory Visit: Payer: Self-pay | Admitting: Family Medicine

## 2021-09-07 NOTE — Telephone Encounter (Signed)
Amlodipine refilled per patient request.

## 2021-09-22 ENCOUNTER — Ambulatory Visit (INDEPENDENT_AMBULATORY_CARE_PROVIDER_SITE_OTHER): Payer: Medicare HMO | Admitting: Family Medicine

## 2021-09-22 ENCOUNTER — Encounter: Payer: Self-pay | Admitting: Family Medicine

## 2021-09-22 VITALS — BP 139/69 | HR 52 | Temp 98.1°F | Resp 16 | Wt 118.8 lb

## 2021-09-22 DIAGNOSIS — E785 Hyperlipidemia, unspecified: Secondary | ICD-10-CM | POA: Diagnosis not present

## 2021-09-22 DIAGNOSIS — I10 Essential (primary) hypertension: Secondary | ICD-10-CM

## 2021-09-22 DIAGNOSIS — B3731 Acute candidiasis of vulva and vagina: Secondary | ICD-10-CM | POA: Diagnosis not present

## 2021-09-22 DIAGNOSIS — E039 Hypothyroidism, unspecified: Secondary | ICD-10-CM | POA: Diagnosis not present

## 2021-09-22 MED ORDER — FLUCONAZOLE 150 MG PO TABS: 150.0000 mg | ORAL_TABLET | Freq: Once | ORAL | 0 refills | Status: AC

## 2021-09-22 MED ORDER — LEVOTHYROXINE SODIUM 50 MCG PO TABS: 50.0000 ug | ORAL_TABLET | Freq: Every day | ORAL | 1 refills | Status: DC

## 2021-09-22 MED ORDER — CARVEDILOL 12.5 MG PO TABS: 12.5000 mg | ORAL_TABLET | Freq: Two times a day (BID) | ORAL | 1 refills | Status: DC

## 2021-09-22 MED ORDER — SIMVASTATIN 5 MG PO TABS: 5.0000 mg | ORAL_TABLET | Freq: Every day | ORAL | 1 refills | Status: DC

## 2021-09-22 MED ORDER — AMLODIPINE BESYLATE 10 MG PO TABS: 10.0000 mg | ORAL_TABLET | Freq: Every day | ORAL | 1 refills | Status: DC

## 2021-09-22 NOTE — Progress Notes (Signed)
Patient is here for f/HTN. Patient said she is doing well. Patient has no new concerns today.

## 2021-09-22 NOTE — Progress Notes (Signed)
Established Patient Office Visit  Subjective    Patient ID: Claudia Stein, female    DOB: Oct 11, 1933  Age: 88 y.o. MRN: [id number]  CC:  Chief Complaint  Patient presents with   Follow-up   Hypertension    HPI Claudia Stein presents for routine follow up of chronic med issues including hypertension and hypothyroidism. Patient also reports that she has had a vaginal yeast infection.    Outpatient Encounter Medications as of 09/22/2021  Medication Sig   Calcium Carbonate-Vitamin D (CALCIUM PLUS VITAMIN D PO) Take 1 tablet by mouth daily.     cholecalciferol (VITAMIN D3) 25 MCG (1000 UNIT) tablet Take 1,000 Units by mouth daily.   clotrimazole (LOTRIMIN) 1 % cream Apply 1 application topically 2 (two) times daily.   fluconazole (DIFLUCAN) 150 MG tablet Take 1 tablet (150 mg total) by mouth once for 1 dose.   latanoprost (XALATAN) 0.005 % ophthalmic solution Place 1 drop into both eyes at bedtime.    Multiple Vitamins-Minerals (PRESERVISION AREDS 2 PO) Take 1 tablet by mouth in the morning and at bedtime.    vitamin B-12 (CYANOCOBALAMIN) 1000 MCG tablet Take 1,000 mcg by mouth daily.     vitamin C (ASCORBIC ACID) 500 MG tablet Take 500 mg by mouth daily.     [DISCONTINUED] amLODipine (NORVASC) 10 MG tablet Take 1 tablet (10 mg total) by mouth daily.   [DISCONTINUED] carvedilol (COREG) 12.5 MG tablet Take 1 tablet (12.5 mg total) by mouth 2 (two) times daily with a meal.   [DISCONTINUED] levothyroxine (SYNTHROID) 50 MCG tablet Take 1 tablet (50 mcg total) by mouth daily before breakfast.   [DISCONTINUED] simvastatin (ZOCOR) 5 MG tablet Take 1 tablet (5 mg total) by mouth at bedtime.   amLODipine (NORVASC) 10 MG tablet Take 1 tablet (10 mg total) by mouth daily.   carvedilol (COREG) 12.5 MG tablet Take 1 tablet (12.5 mg total) by mouth 2 (two) times daily with a meal.   levothyroxine (SYNTHROID) 50 MCG tablet Take 1 tablet (50 mcg total) by mouth daily before breakfast.   simvastatin  (ZOCOR) 5 MG tablet Take 1 tablet (5 mg total) by mouth at bedtime.   No facility-administered encounter medications on file as of 09/22/2021.    Past Medical History:  Diagnosis Date   Bruises easily    Cancer ([place]) 04-16-09, 2012   Breast- bilateral   Hyperlipidemia    Hypertension    PONV (postoperative nausea and vomiting)    Thyroid disease     Past Surgical History:  Procedure Laterality Date   APPENDECTOMY     BREAST SURGERY  04-16-09   left  Lumpectomy   FRACTURE SURGERY     broke right ankle in the 80's   MASTECTOMY  November 2011   right breast   MASTECTOMY W/ SENTINEL NODE BIOPSY  03/22/2011   Procedure: MASTECTOMY WITH SENTINEL LYMPH NODE BIOPSY;  Surgeon: Shann Medal, MD;  Location: [facility];  Service: General;  Laterality: Left;  left mastectomy, sential lymph node biopsy, left axilla    Family History  Problem Relation Age of Onset   Heart disease Mother 40   Cancer Paternal Aunt        colon    Social History   Socioeconomic History   Marital status: Married    Spouse name: Not on file   Number of children: Not on file   Years of education: Not on file   Highest education level: Not on file  Occupational History   Not on file  Tobacco Use   Smoking status: Never   Smokeless tobacco: Never  Substance and Sexual Activity   Alcohol use: No   Drug use: No   Sexual activity: Not Currently  Other Topics Concern   Not on file  Social History Narrative   Not on file   Social Determinants of Health   Financial Resource Strain: Not on file  Food Insecurity: Not on file  Transportation Needs: Not on file  Physical Activity: Not on file  Stress: Not on file  Social Connections: Not on file  Intimate Partner Violence: Not on file    ROS      Objective    BP 139/69   Pulse (!) 52   Temp 98.1 F (36.7 C) (Oral)   Resp 16   Wt 118 lb 12.8 oz (53.9 kg)   SpO2 97%   BMI 23.99 kg/m   Physical Exam Vitals and nursing note reviewed.   Constitutional:      General: She is not in acute distress. Cardiovascular:     Rate and Rhythm: Normal rate and regular rhythm.  Pulmonary:     Effort: Pulmonary effort is normal.     Breath sounds: Normal breath sounds.  Abdominal:     Palpations: Abdomen is soft.     Tenderness: There is no abdominal tenderness.  Musculoskeletal:     Thoracic back: Deformity (kyphosis) present.     Comments: Utilizing walking cane  Neurological:     General: No focal deficit present.     Mental Status: She is alert and oriented to person, place, and time.  Psychiatric:        Mood and Affect: Mood normal.        Assessment & Plan:   1. Essential hypertension Improved and appears stable. Continue and monitor. Meds refilled.   2. Hyperlipidemia, unspecified hyperlipidemia type Continue present management. Meds refilled  3. Hypothyroidism, unspecified type Continue present management. Meds refilled.   4. Vaginal yeast infection Diflucan prescribed.  Return in about 6 months (around 03/25/2022) for follow up.   Becky Sax, MD

## 2021-09-23 DIAGNOSIS — H401131 Primary open-angle glaucoma, bilateral, mild stage: Secondary | ICD-10-CM | POA: Diagnosis not present

## 2021-10-12 DIAGNOSIS — I10 Essential (primary) hypertension: Secondary | ICD-10-CM | POA: Diagnosis not present

## 2021-10-12 DIAGNOSIS — Z9181 History of falling: Secondary | ICD-10-CM | POA: Diagnosis not present

## 2021-10-12 DIAGNOSIS — Z853 Personal history of malignant neoplasm of breast: Secondary | ICD-10-CM | POA: Diagnosis not present

## 2021-10-12 DIAGNOSIS — Z8249 Family history of ischemic heart disease and other diseases of the circulatory system: Secondary | ICD-10-CM | POA: Diagnosis not present

## 2021-10-12 DIAGNOSIS — E039 Hypothyroidism, unspecified: Secondary | ICD-10-CM | POA: Diagnosis not present

## 2021-10-12 DIAGNOSIS — H409 Unspecified glaucoma: Secondary | ICD-10-CM | POA: Diagnosis not present

## 2021-10-12 DIAGNOSIS — E785 Hyperlipidemia, unspecified: Secondary | ICD-10-CM | POA: Diagnosis not present

## 2021-10-12 DIAGNOSIS — R32 Unspecified urinary incontinence: Secondary | ICD-10-CM | POA: Diagnosis not present

## 2021-10-12 DIAGNOSIS — Z87440 Personal history of urinary (tract) infections: Secondary | ICD-10-CM | POA: Diagnosis not present

## 2021-10-12 DIAGNOSIS — K219 Gastro-esophageal reflux disease without esophagitis: Secondary | ICD-10-CM | POA: Diagnosis not present

## 2021-12-03 ENCOUNTER — Emergency Department (HOSPITAL_COMMUNITY)
Admission: EM | Admit: 2021-12-03 | Discharge: 2021-12-03 | Disposition: A | Discharge status: Home / Self Care | Payer: Medicare HMO | Attending: Emergency Medicine | Admitting: Emergency Medicine

## 2021-12-03 ENCOUNTER — Emergency Department (HOSPITAL_COMMUNITY): Payer: Medicare HMO

## 2021-12-03 ENCOUNTER — Encounter (HOSPITAL_COMMUNITY): Payer: Self-pay | Admitting: Emergency Medicine

## 2021-12-03 DIAGNOSIS — S0181XA Laceration without foreign body of other part of head, initial encounter: Secondary | ICD-10-CM | POA: Diagnosis not present

## 2021-12-03 DIAGNOSIS — Z043 Encounter for examination and observation following other accident: Secondary | ICD-10-CM | POA: Diagnosis not present

## 2021-12-03 DIAGNOSIS — S0993XA Unspecified injury of face, initial encounter: Secondary | ICD-10-CM | POA: Diagnosis not present

## 2021-12-03 DIAGNOSIS — S0990XA Unspecified injury of head, initial encounter: Secondary | ICD-10-CM | POA: Diagnosis not present

## 2021-12-03 DIAGNOSIS — M79641 Pain in right hand: Secondary | ICD-10-CM | POA: Diagnosis not present

## 2021-12-03 DIAGNOSIS — Z23 Encounter for immunization: Secondary | ICD-10-CM | POA: Diagnosis not present

## 2021-12-03 DIAGNOSIS — E039 Hypothyroidism, unspecified: Secondary | ICD-10-CM | POA: Insufficient documentation

## 2021-12-03 DIAGNOSIS — Z79899 Other long term (current) drug therapy: Secondary | ICD-10-CM | POA: Insufficient documentation

## 2021-12-03 DIAGNOSIS — W19XXXA Unspecified fall, initial encounter: Secondary | ICD-10-CM | POA: Insufficient documentation

## 2021-12-03 DIAGNOSIS — S0083XA Contusion of other part of head, initial encounter: Secondary | ICD-10-CM | POA: Insufficient documentation

## 2021-12-03 DIAGNOSIS — Z743 Need for continuous supervision: Secondary | ICD-10-CM | POA: Diagnosis not present

## 2021-12-03 DIAGNOSIS — M2578 Osteophyte, vertebrae: Secondary | ICD-10-CM | POA: Diagnosis not present

## 2021-12-03 DIAGNOSIS — S0003XA Contusion of scalp, initial encounter: Secondary | ICD-10-CM | POA: Diagnosis not present

## 2021-12-03 DIAGNOSIS — K449 Diaphragmatic hernia without obstruction or gangrene: Secondary | ICD-10-CM | POA: Diagnosis not present

## 2021-12-03 DIAGNOSIS — T148XXA Other injury of unspecified body region, initial encounter: Secondary | ICD-10-CM

## 2021-12-03 DIAGNOSIS — I10 Essential (primary) hypertension: Secondary | ICD-10-CM | POA: Diagnosis not present

## 2021-12-03 DIAGNOSIS — R58 Hemorrhage, not elsewhere classified: Secondary | ICD-10-CM | POA: Diagnosis not present

## 2021-12-03 MED ORDER — TETANUS-DIPHTH-ACELL PERTUSSIS 5-2.5-18.5 LF-MCG/0.5 IM SUSY
0.5000 mL | PREFILLED_SYRINGE | Freq: Once | INTRAMUSCULAR | Status: AC
  Administered 2021-12-03: 0.5 mL via INTRAMUSCULAR
  Filled 2021-12-03: qty 0.5

## 2021-12-03 NOTE — ED Notes (Signed)
Pt ambulated to bathroom with steady gait and no complaints

## 2021-12-03 NOTE — ED Triage Notes (Addendum)
Patient BIB [facility] after mechanical fall, patient is usually unstable on her feet and grandson was coming to assist patient out of vehicle but she attempt to stand without assistance. Hematoma to right eyebrow, denies LOC. Denies being anticoagulated.

## 2021-12-03 NOTE — ED Provider Notes (Signed)
[facility] EMERGENCY DEPARTMENT Provider Note   CSN: [id number] Arrival date & time: 12/03/21  1211     History  Chief Complaint  Patient presents with   Claudia Stein is a 88 y.o. female with past medical history of hypertension and hypothyroidism, who presents the emergency department after a witnessed mechanical fall.  The patient was reportedly getting out of the passenger seat of a vehicle when she had tripped and fallen forward striking her right head on the pavement.  There is no associated loss of consciousness.  The patient was subsequently ambulatory.  She states that she did not break her fall with her hands or her legs.  Aside from her right frontal hematoma and abrasions as well as a right fifth digit impairment in range of motion the patient is completely asymptomatic and has no pain elsewhere.  She is not taking any blood thinning medication. She is unaware of last tetanus.  She presents with her grandson who confirms the story and mechanism as above.   Fall Associated symptoms include headaches. Pertinent negatives include no chest pain, no abdominal pain and no shortness of breath.       Home Medications Prior to Admission medications   Medication Sig Start Date End Date Taking? Authorizing Provider  amLODipine (NORVASC) 10 MG tablet Take 1 tablet (10 mg total) by mouth daily. 09/22/21   Dorna Mai, MD  Calcium Carbonate-Vitamin D (CALCIUM PLUS VITAMIN D PO) Take 1 tablet by mouth daily.      [provider]  carvedilol (COREG) 12.5 MG tablet Take 1 tablet (12.5 mg total) by mouth 2 (two) times daily with a meal. 09/22/21   Dorna Mai, MD  cholecalciferol (VITAMIN D3) 25 MCG (1000 UNIT) tablet Take 1,000 Units by mouth daily.    [provider]  clotrimazole (LOTRIMIN) 1 % cream Apply 1 application topically 2 (two) times daily. 02/25/21   Dorna Mai, MD  latanoprost (XALATAN) 0.005 % ophthalmic solution Place 1  drop into both eyes at bedtime.  12/10/19   [provider]  levothyroxine (SYNTHROID) 50 MCG tablet Take 1 tablet (50 mcg total) by mouth daily before breakfast. 09/22/21   Dorna Mai, MD  Multiple Vitamins-Minerals (PRESERVISION AREDS 2 PO) Take 1 tablet by mouth in the morning and at bedtime.     [provider]  simvastatin (ZOCOR) 5 MG tablet Take 1 tablet (5 mg total) by mouth at bedtime. 09/22/21   Dorna Mai, MD  vitamin B-12 (CYANOCOBALAMIN) 1000 MCG tablet Take 1,000 mcg by mouth daily.      [provider]  vitamin C (ASCORBIC ACID) 500 MG tablet Take 500 mg by mouth daily.      [provider]      Allergies    Patient has no known allergies.    Review of Systems   Review of Systems  Constitutional:  Negative for chills and fever.  HENT:  Negative for ear pain and sore throat.   Eyes:  Negative for pain and visual disturbance.  Respiratory:  Negative for cough and shortness of breath.   Cardiovascular:  Negative for chest pain and palpitations.  Gastrointestinal:  Negative for abdominal pain and vomiting.  Genitourinary:  Negative for dysuria and hematuria.  Musculoskeletal:  Negative for arthralgias and back pain.  Skin:  Negative for color change and rash.  Neurological:  Positive for headaches. Negative for seizures and syncope.  All other systems reviewed and are negative.  Physical Exam Updated Vital Signs There were no vitals taken for this visit. Physical Exam Vitals and nursing note reviewed.  Constitutional:      General: She is not in acute distress.    Appearance: She is well-developed.     Comments: Upon entering the exam room, the patient is sitting upright awake and alert.  She is in no acute distress.  She is a large immediately visible hematoma to the right forehead.  HENT:     Head: Normocephalic and atraumatic.  Eyes:     Conjunctiva/sclera: Conjunctivae normal.  Cardiovascular:     Rate and Rhythm: Normal  rate and regular rhythm.     Heart sounds: No murmur heard.    Comments: Regular rate and rhythm no murmurs or gallops Pulmonary:     Effort: Pulmonary effort is normal. No respiratory distress.     Breath sounds: Normal breath sounds.     Comments: CTAB.  No chest wall tenderness to AP or lateral compression.  No visible evidence of trauma to the chest upon inspection of the skin. Abdominal:     Palpations: Abdomen is soft.     Tenderness: There is no abdominal tenderness.     Comments: No visible evidence of trauma to the abdomen on skin exam.  Nontender nondistended soft.  Musculoskeletal:        General: No swelling.     Cervical back: Neck supple.     Comments: Patient spine was palpated in its entirety without any step-off point tenderness or deformity.  Upon visual inspection of the patient's spine there is no evidence of trauma.  Pelvis is stable to AP and lateral compression and nontender.  Impaired range of motion to the patient's right fifth PIP joint without any appreciable wound or point tenderness.  All other extremities were palpated without any point tenderness or significant traumatic findings  Skin:    General: Skin is warm and dry.     Capillary Refill: Capillary refill takes less than 2 seconds.  Neurological:     Mental Status: She is alert.     Comments: Patient is fully alert and oriented moving all extremity spontaneously and is grossly neurologically intact.  Psychiatric:        Mood and Affect: Mood normal.      ED Results / Procedures / Treatments   Labs (all labs ordered are listed, but only abnormal results are displayed) Labs Reviewed - No data to display  EKG None  Radiology No results found.  Procedures .Marland KitchenLaceration Repair  Date/Time: 12/03/2021 2:13 PM  Performed by: Levie Heritage, MD Authorized by: Isla Pence, MD   Consent:    Consent obtained:  Verbal   Risks discussed:  Infection and pain Universal protocol:    Procedure  explained and questions answered to patient or proxy's satisfaction: yes     Patient identity confirmed:  Verbally with patient Anesthesia:    Anesthesia method:  None Laceration details:    Location:  Face   Face location:  Forehead   Length (cm):  1 Pre-procedure details:    Preparation:  Patient was prepped and draped in usual sterile fashion Treatment:    Irrigation method:  Pressure wash and syringe Skin repair:    Repair method:  Tissue adhesive Approximation:    Laceration repair approximation: dermabond. Post-procedure details:    Dressing:  Open (no dressing)     Medications Ordered in ED Medications - No data to display  ED Course/ Medical Decision Making/ A&P  Medical Decision Making Amount and/or Complexity of Data Reviewed Independent Historian: caregiver    Details: adult grandchild Radiology: ordered.  Risk Prescription drug management.  Patient presents the emergency department hemodynamically stable, afebrile and after a witnessed mechanical fall with physical exam remarkable for head trauma and question right hand trauma as above.  Will obtain CT head, CT C-spine, screening chest and pelvic x-rays as well as a Right hand plain film.  Tetanus updated.  I personally viewed interpreted the patient's trauma imaging which was grossly unremarkable for intracranial hemorrhage, cervical spinal fracture, rib fracture or pneumothorax or hand fracture respectively.  Patient's wound was thoroughly irrigated and repaired as above.  Patient ambulated without difficulty  Patient was discharged home in hemodynamically stable condition with grandson and strict return precautions to the emergency department for recurrent fall or any new problem moving forward including persistent nausea vomiting or confusion.               Final Clinical Impression(s) / ED Diagnoses Final diagnoses:  Fall, initial encounter  Contusion of  forehead, initial encounter  Hematoma    Rx / DC Orders ED Discharge Orders     None         Levie Heritage, MD 12/03/21 1417    Isla Pence, MD 12/03/21 1423

## 2021-12-07 NOTE — Progress Notes (Signed)
Patient ID: ELEN ACERO, female    DOB: [birth year]  MRN: [id number]  CC: Emergency Department Follow-Up  Subjective: Claudia Stein is a 88 y.o. female who presents for emergency department follow-up. She is accompanied by her daughter in Cytogenetic[employer].  Her concerns today include:  12/03/2021 [facility] Emergency Department per MD note: Attestation: I saw and evaluated the patient, reviewed the resident's note and I agree with the findings and plan.   Pt is a 88 yo female with a mechanical fall.  Pt did hit her head.  No loc.  No thinners.  Pt is alert and oriented.  Pt's xrays and ct scans do not show anything acute internally.  Pt is able to ambulate.  She is stable for d/c.  Return if worse.  F/u with pcp  Medical Decision Making Amount and/or Complexity of Data Reviewed Independent Historian: caregiver    Details: adult grandchild Radiology: ordered.   Risk Prescription drug management.   Patient presents the emergency department hemodynamically stable, afebrile and after a witnessed mechanical fall with physical exam remarkable for head trauma and question right hand trauma as above.   Will obtain CT head, CT C-spine, screening chest and pelvic x-rays as well as a Right hand plain film.   Tetanus updated.   I personally viewed interpreted the patient's trauma imaging which was grossly unremarkable for intracranial hemorrhage, cervical spinal fracture, rib fracture or pneumothorax or hand fracture respectively.   Patient's wound was thoroughly irrigated and repaired as above.   Patient ambulated without difficulty   Patient was discharged home in hemodynamically stable condition with grandson and strict return precautions to the emergency department for recurrent fall or any new problem moving forward including persistent nausea vomiting or confusion.  Follow-Ups: Schedule an appointment with Dorna Mai, MD (Family Medicine)  Today's visit 12/15/2021: Since  emergency department feeling improved. Using ice packs for face/forehead and noticed improvement. Denies nausea, vomiting, change in memory, worst headaches of life and additional red flag symptoms. Having some trouble seeing out of right eye due to swelling. Has eyeglasses and does not wear them states it makes things worse. Would like to be checked for UTI and yeast infection. Home blood pressures are normal.    Patient Active Problem List   Diagnosis Date Noted   Essential hypertension 12/30/2019   Hyperlipemia 12/30/2019   Acute lower UTI 12/30/2019   Thrombocytopenia ([place]) 12/30/2019   FTT (failure to thrive) in adult 12/30/2019   Hypothyroidism 12/30/2019   Hyponatremia 12/30/2019   Fall at home, initial encounter 12/30/2019   Laceration of scalp 12/30/2019   COVID-19 virus detected 12/30/2019   History of breast cancer, left, left mastectomy - 03/22/2011.  T1a, N0. 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 90 tablet 1   Calcium Carbonate-Vitamin D (CALCIUM PLUS VITAMIN D PO) Take 1 tablet by mouth daily.       carvedilol (COREG) 12.5 MG tablet Take 1 tablet (12.5 mg total) by mouth 2 (two) times daily with a meal. 180 tablet 1   cholecalciferol (VITAMIN D3) 25 MCG (1000 UNIT) tablet Take 1,000 Units by mouth daily.     clotrimazole (LOTRIMIN) 1 % cream Apply 1 application topically 2 (two) times daily. 45 g 0   latanoprost (XALATAN) 0.005 %  ophthalmic solution Place 1 drop into both eyes at bedtime.      levothyroxine (SYNTHROID) 50 MCG tablet Take 1 tablet (50 mcg total) by mouth daily before breakfast. 90 tablet 1   Multiple Vitamins-Minerals (PRESERVISION AREDS 2 PO) Take 1 tablet by mouth in the morning and at bedtime.      simvastatin (ZOCOR) 5 MG tablet Take 1 tablet (5 mg total)  by mouth at bedtime. 90 tablet 1   vitamin B-12 (CYANOCOBALAMIN) 1000 MCG tablet Take 1,000 mcg by mouth daily.       vitamin C (ASCORBIC ACID) 500 MG tablet Take 500 mg by mouth daily.       No current facility-administered medications on file prior to visit.    No Known Allergies  Social History   Socioeconomic History   Marital status: Married    Spouse name: Not on file   Number of children: Not on file   Years of education: Not on file   Highest education level: Not on file  Occupational History   Not on file  Tobacco Use   Smoking status: Never    Passive exposure: Never   Smokeless tobacco: Never  Substance and Sexual Activity   Alcohol use: No   Drug use: No   Sexual activity: Not Currently  Other Topics Concern   Not on file  Social History Narrative   Not on file   Social Determinants of Health   Financial Resource Strain: Not on file  Food Insecurity: Not on file  Transportation Needs: Not on file  Physical Activity: Not on file  Stress: Not on file  Social Connections: Not on file  Intimate Partner Violence: Not on file    Family History  Problem Relation Age of Onset   Heart disease Mother 32   Cancer Paternal Aunt        colon    Past Surgical History:  Procedure Laterality Date   APPENDECTOMY     BREAST SURGERY  04-16-09   left  Lumpectomy   FRACTURE SURGERY     broke right ankle in the 80's   MASTECTOMY  November 2011   right breast   MASTECTOMY W/ SENTINEL NODE BIOPSY  03/22/2011   Procedure: MASTECTOMY WITH SENTINEL LYMPH NODE BIOPSY;  Surgeon: Shann Medal, MD;  Location: [facility];  Service: General;  Laterality: Left;  left mastectomy, sential lymph node biopsy, left axilla    ROS: Review of Systems Negative except as stated above  PHYSICAL EXAM: BP (!) 164/70 (BP Location: Right Arm, Patient Position: Sitting, Cuff Size: Normal)   Pulse 69   Temp 98.3 F (36.8 C)   Resp 16   Ht '4\' 11"'$  (1.499 m)   Wt 119 lb (54 kg)   SpO2  95%   BMI 24.04 kg/m   Physical Exam HENT:     Head: Normocephalic and atraumatic.  Eyes:     Extraocular Movements: Extraocular movements intact.     Conjunctiva/sclera: Conjunctivae normal.     Pupils: Pupils are equal, round, and reactive to light.  Cardiovascular:     Rate and Rhythm: Normal rate and regular rhythm.     Pulses: Normal pulses.     Heart sounds: Normal heart sounds.  Pulmonary:     Effort: Pulmonary effort is normal.     Breath sounds: Normal breath sounds.  Musculoskeletal:     Cervical back: Normal range of motion and neck supple.  Neurological:     General: No focal deficit  present.     Mental Status: She is alert and oriented to person, place, and time.  Psychiatric:        Mood and Affect: Mood normal.        Behavior: Behavior normal.        Results for orders placed or performed in visit on 12/15/21  POCT URINALYSIS DIP (CLINITEK)  Result Value Ref Range   Color, UA yellow yellow   Clarity, UA clear clear   Glucose, UA negative negative mg/dL   Bilirubin, UA negative negative   Ketones, POC UA negative negative mg/dL   Spec Grav, UA 1.025 1.010 - 1.025   Blood, UA negative negative   pH, UA 7.0 5.0 - 8.0   POC PROTEIN,UA negative negative, trace   Urobilinogen, UA 0.2 0.2 or 1.0 E.U./dL   Nitrite, UA Positive (A) Negative   Leukocytes, UA Small (1+) (A) Negative     ASSESSMENT AND PLAN: 1. Fall, subsequent encounter 2. Contusion of forehead, subsequent encounter 3. Hematoma - Improved since emergency department discharge. No acute signs present such as but not limited to nausea, vomiting, worst headaches of life.  - Continue conservative measures.  - Return in 2 weeks for monitoring with primary care provider.  4. Vaginal itching - Routine screening. - Cervicovaginal ancillary only  5. Acute cystitis without hematuria - Amoxicillin-Clavulanate for urinary tract infection.  - Follow-up with primary provider as scheduled.  -  POCT URINALYSIS DIP (CLINITEK) - amoxicillin-clavulanate (AUGMENTIN) 875-125 MG tablet; Take 1 tablet by mouth 2 (two) times daily for 7 days.  Dispense: 14 tablet; Refill: 0  Patient was given the opportunity to ask questions.  Patient verbalized understanding of the plan and was able to repeat key elements of the plan. Patient was given clear instructions to go to Emergency Department or return to medical center if symptoms don't improve, worsen, or new problems develop.The patient verbalized understanding.   Orders Placed This Encounter  Procedures   POCT URINALYSIS DIP (CLINITEK)      Return in about 2 weeks (around 12/29/2021) for Follow-Up or next available contusion/hematoma with Dorna Mai, MD .  Camillia Herter, NP

## 2021-12-15 ENCOUNTER — Other Ambulatory Visit (HOSPITAL_COMMUNITY)
Admission: RE | Admit: 2021-12-15 | Discharge: 2021-12-15 | Disposition: A | Discharge status: Home / Self Care | Payer: Medicare HMO | Source: Ambulatory Visit | Attending: Family | Admitting: Family

## 2021-12-15 ENCOUNTER — Encounter: Payer: Self-pay | Admitting: Family

## 2021-12-15 ENCOUNTER — Ambulatory Visit (INDEPENDENT_AMBULATORY_CARE_PROVIDER_SITE_OTHER): Payer: Medicare HMO | Admitting: Family

## 2021-12-15 VITALS — BP 164/70 | HR 69 | Temp 98.3°F | Resp 16 | Ht 59.0 in | Wt 119.0 lb

## 2021-12-15 DIAGNOSIS — N3000 Acute cystitis without hematuria: Secondary | ICD-10-CM | POA: Diagnosis not present

## 2021-12-15 DIAGNOSIS — N898 Other specified noninflammatory disorders of vagina: Secondary | ICD-10-CM | POA: Diagnosis present

## 2021-12-15 DIAGNOSIS — S0083XD Contusion of other part of head, subsequent encounter: Secondary | ICD-10-CM

## 2021-12-15 DIAGNOSIS — W19XXXD Unspecified fall, subsequent encounter: Secondary | ICD-10-CM

## 2021-12-15 DIAGNOSIS — T148XXA Other injury of unspecified body region, initial encounter: Secondary | ICD-10-CM

## 2021-12-15 LAB — POCT URINALYSIS DIP (CLINITEK)
Bilirubin, UA: NEGATIVE
Blood, UA: NEGATIVE
Glucose, UA: NEGATIVE mg/dL
Ketones, POC UA: NEGATIVE mg/dL
Nitrite, UA: POSITIVE — AB
POC PROTEIN,UA: NEGATIVE
Spec Grav, UA: 1.025 (ref 1.010–1.025)
Urobilinogen, UA: 0.2 E.U./dL
pH, UA: 7 (ref 5.0–8.0)

## 2021-12-15 MED ORDER — AMOXICILLIN-POT CLAVULANATE 875-125 MG PO TABS: 1.0000 | ORAL_TABLET | Freq: Two times a day (BID) | ORAL | 0 refills | Status: AC

## 2021-12-15 NOTE — Progress Notes (Signed)
.  Pt presents for hospital follow-up, accompanied by daughter in law, pt states that her right leg gave out on her and this is why she fell  Pt states few months ago provider gave diflucan because she has vaginal itching and son wanted to notify provider that pt sister died of UTI and wanted mother checked adv pt we will need urine and swab to confirm diagnosis

## 2021-12-22 ENCOUNTER — Other Ambulatory Visit: Payer: Self-pay | Admitting: Family

## 2021-12-22 DIAGNOSIS — B3731 Acute candidiasis of vulva and vagina: Secondary | ICD-10-CM

## 2021-12-22 LAB — CERVICOVAGINAL ANCILLARY ONLY
Bacterial Vaginitis (gardnerella): NEGATIVE
Candida Glabrata: NEGATIVE
Candida Vaginitis: POSITIVE — AB
Chlamydia: NEGATIVE
Comment: NEGATIVE
Comment: NEGATIVE
Comment: NEGATIVE
Comment: NEGATIVE
Comment: NEGATIVE
Comment: NORMAL
Neisseria Gonorrhea: NEGATIVE
Trichomonas: NEGATIVE

## 2021-12-22 MED ORDER — FLUCONAZOLE 150 MG PO TABS: 150.0000 mg | ORAL_TABLET | Freq: Once | ORAL | 0 refills | Status: AC

## 2021-12-28 ENCOUNTER — Ambulatory Visit (INDEPENDENT_AMBULATORY_CARE_PROVIDER_SITE_OTHER): Payer: Medicare HMO | Admitting: Family Medicine

## 2021-12-28 VITALS — BP 142/62 | HR 70 | Temp 97.7°F | Resp 16 | Wt 120.8 lb

## 2021-12-28 DIAGNOSIS — S0083XS Contusion of other part of head, sequela: Secondary | ICD-10-CM | POA: Diagnosis not present

## 2021-12-28 DIAGNOSIS — S0083XA Contusion of other part of head, initial encounter: Secondary | ICD-10-CM

## 2021-12-28 DIAGNOSIS — H02401 Unspecified ptosis of right eyelid: Secondary | ICD-10-CM

## 2021-12-28 DIAGNOSIS — W19XXXS Unspecified fall, sequela: Secondary | ICD-10-CM | POA: Diagnosis not present

## 2021-12-28 DIAGNOSIS — W19XXXD Unspecified fall, subsequent encounter: Secondary | ICD-10-CM

## 2021-12-28 DIAGNOSIS — W19XXXA Unspecified fall, initial encounter: Secondary | ICD-10-CM

## 2021-12-28 NOTE — Progress Notes (Unsigned)
New Patient Office Visit  Subjective    Patient ID: Claudia Stein, female    DOB: July 14, 1933  Age: 88 y.o. MRN: [id number]  CC:  Chief Complaint  Patient presents with   Follow-up    Fall,     HPI Claudia Stein presents to establish care ***  Outpatient Encounter Medications as of 12/28/2021  Medication Sig   amLODipine (NORVASC) 10 MG tablet Take 1 tablet (10 mg total) by mouth daily.   Calcium Carbonate-Vitamin D (CALCIUM PLUS VITAMIN D PO) Take 1 tablet by mouth daily.     carvedilol (COREG) 12.5 MG tablet Take 1 tablet (12.5 mg total) by mouth 2 (two) times daily with a meal.   cholecalciferol (VITAMIN D3) 25 MCG (1000 UNIT) tablet Take 1,000 Units by mouth daily.   clotrimazole (LOTRIMIN) 1 % cream Apply 1 application topically 2 (two) times daily.   latanoprost (XALATAN) 0.005 % ophthalmic solution Place 1 drop into both eyes at bedtime.    levothyroxine (SYNTHROID) 50 MCG tablet Take 1 tablet (50 mcg total) by mouth daily before breakfast.   Multiple Vitamins-Minerals (PRESERVISION AREDS 2 PO) Take 1 tablet by mouth in the morning and at bedtime.    simvastatin (ZOCOR) 5 MG tablet Take 1 tablet (5 mg total) by mouth at bedtime.   vitamin B-12 (CYANOCOBALAMIN) 1000 MCG tablet Take 1,000 mcg by mouth daily.     vitamin C (ASCORBIC ACID) 500 MG tablet Take 500 mg by mouth daily.     No facility-administered encounter medications on file as of 12/28/2021.    Past Medical History:  Diagnosis Date   Bruises easily    Cancer ([place]) 04-16-09, 2012   Breast- bilateral   Hyperlipidemia    Hypertension    PONV (postoperative nausea and vomiting)    Thyroid disease     Past Surgical History:  Procedure Laterality Date   APPENDECTOMY     BREAST SURGERY  04-16-09   left  Lumpectomy   FRACTURE SURGERY     broke right ankle in the 80's   MASTECTOMY  November 2011   right breast   MASTECTOMY W/ SENTINEL NODE BIOPSY  03/22/2011   Procedure: MASTECTOMY WITH SENTINEL LYMPH  NODE BIOPSY;  Surgeon: Shann Medal, MD;  Location: [facility];  Service: General;  Laterality: Left;  left mastectomy, sential lymph node biopsy, left axilla    Family History  Problem Relation Age of Onset   Heart disease Mother 38   Cancer Paternal Aunt        colon    Social History   Socioeconomic History   Marital status: Married    Spouse name: Not on file   Number of children: Not on file   Years of education: Not on file   Highest education level: Not on file  Occupational History   Not on file  Tobacco Use   Smoking status: Never    Passive exposure: Never   Smokeless tobacco: Never  Substance and Sexual Activity   Alcohol use: No   Drug use: No   Sexual activity: Not Currently  Other Topics Concern   Not on file  Social History Narrative   Not on file   Social Determinants of Health   Financial Resource Strain: Not on file  Food Insecurity: Not on file  Transportation Needs: Not on file  Physical Activity: Not on file  Stress: Not on file  Social Connections: Not on file  Intimate Partner Violence: Not on file  ROS      Objective    BP (!) 142/62   Pulse 70   Temp 97.7 F (36.5 C) (Oral)   Resp 16   Wt 120 lb 12.8 oz (54.8 kg)   SpO2 95%   BMI 24.40 kg/m   Physical Exam  {Labs (Optional):23779}    Assessment & Plan:   Problem List Items Addressed This Visit   None   No follow-ups on file.   Becky Sax, MD

## 2021-12-29 ENCOUNTER — Encounter: Payer: Self-pay | Admitting: Family Medicine

## 2022-01-18 ENCOUNTER — Ambulatory Visit (INDEPENDENT_AMBULATORY_CARE_PROVIDER_SITE_OTHER): Payer: Medicare HMO | Admitting: Family Medicine

## 2022-01-18 ENCOUNTER — Encounter: Payer: Self-pay | Admitting: Family Medicine

## 2022-01-18 VITALS — BP 146/70 | HR 64 | Temp 98.1°F | Resp 16 | Wt 120.0 lb

## 2022-01-18 DIAGNOSIS — H02401 Unspecified ptosis of right eyelid: Secondary | ICD-10-CM

## 2022-01-19 ENCOUNTER — Encounter: Payer: Self-pay | Admitting: Family Medicine

## 2022-01-19 NOTE — Progress Notes (Signed)
Established Patient Office Visit  Subjective    Patient ID: Claudia Stein, female    DOB: [birth year]  Age: 88 y.o. MRN: [id number]  CC:  Chief Complaint  Patient presents with   Follow-up    HPI Claudia Stein presents for follow up of a fall that she had about 6 weeks ago. She reports improvement of bruising and no neuro sx but her eyelid continues to be slightly swollen and "lagging". Patient denies visual changes.   Outpatient Encounter Medications as of 01/18/2022  Medication Sig   amLODipine (NORVASC) 10 MG tablet Take 1 tablet (10 mg total) by mouth daily.   Calcium Carbonate-Vitamin D (CALCIUM PLUS VITAMIN D PO) Take 1 tablet by mouth daily.     carvedilol (COREG) 12.5 MG tablet Take 1 tablet (12.5 mg total) by mouth 2 (two) times daily with a meal.   cholecalciferol (VITAMIN D3) 25 MCG (1000 UNIT) tablet Take 1,000 Units by mouth daily.   clotrimazole (LOTRIMIN) 1 % cream Apply 1 application topically 2 (two) times daily.   latanoprost (XALATAN) 0.005 % ophthalmic solution Place 1 drop into both eyes at bedtime.    levothyroxine (SYNTHROID) 50 MCG tablet Take 1 tablet (50 mcg total) by mouth daily before breakfast.   Multiple Vitamins-Minerals (PRESERVISION AREDS 2 PO) Take 1 tablet by mouth in the morning and at bedtime.    simvastatin (ZOCOR) 5 MG tablet Take 1 tablet (5 mg total) by mouth at bedtime.   vitamin B-12 (CYANOCOBALAMIN) 1000 MCG tablet Take 1,000 mcg by mouth daily.     vitamin C (ASCORBIC ACID) 500 MG tablet Take 500 mg by mouth daily.     No facility-administered encounter medications on file as of 01/18/2022.    Past Medical History:  Diagnosis Date   Bruises easily    Cancer ([place]) 04-16-09, 2012   Breast- bilateral   Hyperlipidemia    Hypertension    PONV (postoperative nausea and vomiting)    Thyroid disease     Past Surgical History:  Procedure Laterality Date   APPENDECTOMY     BREAST SURGERY  04-16-09   left  Lumpectomy   FRACTURE SURGERY      broke right ankle in the 80's   MASTECTOMY  November 2011   right breast   MASTECTOMY W/ SENTINEL NODE BIOPSY  03/22/2011   Procedure: MASTECTOMY WITH SENTINEL LYMPH NODE BIOPSY;  Surgeon: Shann Medal, MD;  Location: [facility];  Service: General;  Laterality: Left;  left mastectomy, sential lymph node biopsy, left axilla    Family History  Problem Relation Age of Onset   Heart disease Mother 58   Cancer Paternal Aunt        colon    Social History   Socioeconomic History   Marital status: Married    Spouse name: Not on file   Number of children: Not on file   Years of education: Not on file   Highest education level: Not on file  Occupational History   Not on file  Tobacco Use   Smoking status: Never    Passive exposure: Never   Smokeless tobacco: Never  Substance and Sexual Activity   Alcohol use: No   Drug use: No   Sexual activity: Not Currently  Other Topics Concern   Not on file  Social History Narrative   Not on file   Social Determinants of Health   Financial Resource Strain: Not on file  Food Insecurity: Not on file  Transportation  Needs: Not on file  Physical Activity: Not on file  Stress: Not on file  Social Connections: Not on file  Intimate Partner Violence: Not on file    Review of Systems  Eyes:  Negative for blurred vision, double vision, pain and redness.  All other systems reviewed and are negative.       Objective    BP (!) 146/70   Pulse 64   Temp 98.1 F (36.7 C) (Oral)   Resp 16   Wt 120 lb (54.4 kg)   SpO2 97%   BMI 24.24 kg/m   Physical Exam Vitals and nursing note reviewed.  Constitutional:      General: She is not in acute distress. HENT:     Head: Normocephalic.  Eyes:     Comments: Ptosis of right lid  Cardiovascular:     Rate and Rhythm: Normal rate and regular rhythm.  Pulmonary:     Effort: Pulmonary effort is normal.     Breath sounds: Normal breath sounds.  Abdominal:     Palpations: Abdomen is soft.      Tenderness: There is no abdominal tenderness.  Neurological:     General: No focal deficit present.     Mental Status: She is alert and oriented to person, place, and time.         Assessment & Plan:   1. Ptosis of right eyelid Sx persists. Referral to consultant for further eval/mgt - Ambulatory referral to Ophthalmology  No follow-ups on file.   Becky Sax, MD

## 2022-02-16 DIAGNOSIS — H02831 Dermatochalasis of right upper eyelid: Secondary | ICD-10-CM | POA: Diagnosis not present

## 2022-02-16 DIAGNOSIS — H02834 Dermatochalasis of left upper eyelid: Secondary | ICD-10-CM | POA: Diagnosis not present

## 2022-03-23 ENCOUNTER — Ambulatory Visit (INDEPENDENT_AMBULATORY_CARE_PROVIDER_SITE_OTHER): Payer: Medicare HMO | Admitting: Family Medicine

## 2022-03-23 ENCOUNTER — Encounter: Payer: Self-pay | Admitting: Family Medicine

## 2022-03-23 VITALS — BP 151/72 | HR 62 | Temp 98.1°F | Resp 16 | Wt 124.2 lb

## 2022-03-23 DIAGNOSIS — H02401 Unspecified ptosis of right eyelid: Secondary | ICD-10-CM | POA: Diagnosis not present

## 2022-03-23 DIAGNOSIS — I10 Essential (primary) hypertension: Secondary | ICD-10-CM

## 2022-03-23 DIAGNOSIS — Z0000 Encounter for general adult medical examination without abnormal findings: Secondary | ICD-10-CM | POA: Diagnosis not present

## 2022-03-23 DIAGNOSIS — E039 Hypothyroidism, unspecified: Secondary | ICD-10-CM | POA: Diagnosis not present

## 2022-03-23 NOTE — Progress Notes (Signed)
Subjective:   Claudia Stein is a 88 y.o. female who presents for Medicare Annual (Subsequent) preventive examination.  Review of Systems    Refer to pcp       Objective:    Today's Vitals   03/23/22 0836 03/23/22 0844  BP: (!) 155/71 (!) 151/72  Pulse: 62   Resp: 16   Temp: 98.1 F (36.7 C)   TempSrc: Oral   SpO2: 95%   Weight: 124 lb 3.2 oz (56.3 kg)    Body mass index is 25.09 kg/m.     03/05/2021    9:58 AM 12/31/2019    5:00 AM 03/22/2011    8:30 PM 03/16/2011    8:20 AM  Advanced Directives  Does Patient Have a Medical Advance Directive? Yes Yes Patient has advance directive, copy in chart Patient would not like information  Type of Advance Directive  Living will [place];Living will [place];Living will  Does patient want to make changes to medical advance directive? No - Patient declined No - Patient declined    Pre-existing out of facility DNR order (yellow form or pink MOST form)   No     Current Medications (verified) Outpatient Encounter Medications as of 03/23/2022  Medication Sig   amLODipine (NORVASC) 10 MG tablet Take 1 tablet (10 mg total) by mouth daily.   Calcium Carbonate-Vitamin D (CALCIUM PLUS VITAMIN D PO) Take 1 tablet by mouth daily.     carvedilol (COREG) 12.5 MG tablet Take 1 tablet (12.5 mg total) by mouth 2 (two) times daily with a meal.   cholecalciferol (VITAMIN D3) 25 MCG (1000 UNIT) tablet Take 1,000 Units by mouth daily.   clotrimazole (LOTRIMIN) 1 % cream Apply 1 application topically 2 (two) times daily.   latanoprost (XALATAN) 0.005 % ophthalmic solution Place 1 drop into both eyes at bedtime.    levothyroxine (SYNTHROID) 50 MCG tablet Take 1 tablet (50 mcg total) by mouth daily before breakfast.   Multiple Vitamins-Minerals (PRESERVISION AREDS 2 PO) Take 1 tablet by mouth in the morning and at bedtime.    simvastatin (ZOCOR) 5 MG tablet Take 1 tablet (5 mg total) by mouth at bedtime.    vitamin B-12 (CYANOCOBALAMIN) 1000 MCG tablet Take 1,000 mcg by mouth daily.     vitamin C (ASCORBIC ACID) 500 MG tablet Take 500 mg by mouth daily.     No facility-administered encounter medications on file as of 03/23/2022.    Allergies (verified) Haemophilus influenzae vaccines   History: Past Medical History:  Diagnosis Date   Bruises easily    Cancer ([place]) 04-16-09, 2012   Breast- bilateral   Hyperlipidemia    Hypertension    PONV (postoperative nausea and vomiting)    Thyroid disease    Past Surgical History:  Procedure Laterality Date   APPENDECTOMY     BREAST SURGERY  04-16-09   left  Lumpectomy   FRACTURE SURGERY     broke right ankle in the 80's   MASTECTOMY  November 2011   right breast   MASTECTOMY W/ SENTINEL NODE BIOPSY  03/22/2011   Procedure: MASTECTOMY WITH SENTINEL LYMPH NODE BIOPSY;  Surgeon: Shann Medal, MD;  Location: [facility];  Service: General;  Laterality: Left;  left mastectomy, sential lymph node biopsy, left axilla   Family History  Problem Relation Age of Onset   Heart disease Mother 68   Cancer Paternal Aunt        colon   Social History  Socioeconomic History   Marital status: Married    Spouse name: Not on file   Number of children: Not on file   Years of education: Not on file   Highest education level: Not on file  Occupational History   Not on file  Tobacco Use   Smoking status: Never    Passive exposure: Never   Smokeless tobacco: Never  Substance and Sexual Activity   Alcohol use: No   Drug use: No   Sexual activity: Not Currently  Other Topics Concern   Not on file  Social History Narrative   Not on file   Social Determinants of Health   Financial Resource Strain: Not on file  Food Insecurity: Not on file  Transportation Needs: Not on file  Physical Activity: Not on file  Stress: Not on file  Social Connections: Not on file    Tobacco Counseling Counseling given: Not Answered   Clinical  Intake:  Pre-visit preparation completed: No  Pain : No/denies pain     Diabetes: No     Diabetic? n/a  Interpreter Needed?: No      Activities of Daily Living    03/30/2021    8:35 AM  In your present state of health, do you have any difficulty performing the following activities:  Hearing? 1  Vision? 0  Difficulty concentrating or making decisions? 0  Walking or climbing stairs? 1  Dressing or bathing? 0  Doing errands, shopping? 1    Patient Care Team: Dorna Mai, MD as PCP - General (Family Medicine) Marcy Panning, MD as Consulting Physician (Internal Medicine) Kyung Rudd, MD as Consulting Physician (Radiation Oncology)  Indicate any recent Medical Services you may have received from other than [facility] providers in the past year (date may be approximate).     Assessment:   This is a routine wellness examination for [place].  Hearing/Vision screen No results found.  Dietary issues and exercise activities discussed:     Goals Addressed   None   Depression Screen    03/23/2022    8:37 AM 12/28/2021    1:40 PM 12/15/2021    2:29 PM 09/22/2021    8:27 AM 03/30/2021    8:37 AM 02/25/2021    1:26 PM  PHQ 2/9 Scores  PHQ - 2 Score 1 0 0 0 0 1  PHQ- 9 Score 2 2  0 0 2    Fall Risk    12/15/2021    2:29 PM 02/25/2021    1:25 PM  Fall Risk   Falls in the past year? 1 1  Number falls in past yr: 1 1  Injury with Fall? 1 1  Risk for fall due to : Impaired balance/gait;Impaired mobility   Follow up Falls evaluation completed     FALL RISK PREVENTION PERTAINING TO THE HOME:  Any stairs in or around the home? Yes  If so, are there any without handrails? Yes  Home free of loose throw rugs in walkways, pet beds, electrical cords, etc? Yes  Adequate lighting in your home to reduce risk of falls? Yes   ASSISTIVE DEVICES UTILIZED TO PREVENT FALLS:  Life alert? No  Use of a cane, walker or w/c? Yes  Grab bars in the bathroom? Yes  Shower chair or  bench in shower? Yes  Elevated toilet seat or a handicapped toilet? Yes   TIMED UP AND GO:  Was the test performed? Yes .  Length of time to ambulate 10 feet: 60 sec.  Gait slow and steady without use of assistive device  Cognitive Function:        Immunizations Immunization History  Administered Date(s) Administered   Tdap 12/30/2019, 12/03/2021    TDAP status: Up to date  Flu Vaccine status: Declined, Education has been provided regarding the importance of this vaccine but patient still declined. Advised may receive this vaccine at local pharmacy or Health Dept. Aware to provide a copy of the vaccination record if obtained from local pharmacy or Health Dept. Verbalized acceptance and understanding.  Pneumococcal vaccine status: Up to date  Covid-19 vaccine status: Declined, Education has been provided regarding the importance of this vaccine but patient still declined. Advised may receive this vaccine at local pharmacy or Health Dept.or vaccine clinic. Aware to provide a copy of the vaccination record if obtained from local pharmacy or Health Dept. Verbalized acceptance and understanding.  Qualifies for Shingles Vaccine? Yes   Zostavax completed No   Shingrix Completed?: No.    Education has been provided regarding the importance of this vaccine. Patient has been advised to call insurance company to determine out of pocket expense if they have not yet received this vaccine. Advised may also receive vaccine at local pharmacy or Health Dept. Verbalized acceptance and understanding.  Screening Tests Health Maintenance  Topic Date Due   Zoster Vaccines- Shingrix (1 of 2) 03/30/2022 (Originally 04/28/1953)   COVID-19 Vaccine (1) 04/08/2022 (Originally 04/27/1939)   Pneumonia Vaccine 36+ Years old (1 - PCV) 06/30/2022 (Originally 04/27/2001)   DEXA SCAN  06/30/2022 (Originally 04/27/2001)   Medicare Annual Wellness (AWV)  03/24/2023   HPV VACCINES  Aged Out   INFLUENZA VACCINE   Discontinued    Health Maintenance  There are no preventive care reminders to display for this patient.   Colorectal cancer screening: No longer required.   Mammogram status: No longer required due to age.  Bone Density status: Completed 03/23/2022. Results reflect: Bone density results: OSTEOPENIA. Repeat every 1 years.  Lung Cancer Screening: (Low Dose CT Chest recommended if Age 86-80 years, 30 pack-year currently smoking OR have quit w/in 15years.) does not qualify.   Lung Cancer Screening Referral: n/a  Additional Screening:  Hepatitis C Screening: does not qualify; Completed n/a  Vision Screening: Recommended annual ophthalmology exams for early detection of glaucoma and other disorders of the eye. Is the patient up to date with their annual eye exam?  Yes  Who is the provider or what is the name of the office in which the patient attends annual eye exams? N/a If pt is not established with a provider, would they like to be referred to a provider to establish care? No .   Dental Screening: Recommended annual dental exams for proper oral hygiene  Community Resource Referral / Chronic Care Management: CRR required this visit?  No   CCM required this visit?  No      Plan:     I have personally reviewed and noted the following in the patient's chart:   Medical and social history Use of alcohol, tobacco or illicit drugs  Current medications and supplements including opioid prescriptions. Patient is not currently taking opioid prescriptions. Functional ability and status Nutritional status Physical activity Advanced directives List of other physicians Hospitalizations, surgeries, and ER visits in previous 12 months Vitals Screenings to include cognitive, depression, and falls Referrals and appointments  In addition, I have reviewed and discussed with patient certain preventive protocols, quality metrics, and best practice recommendations. A written personalized  care plan  for preventive services as well as general preventive health recommendations were provided to patient.     Melene Plan, RMA   03/23/2022   Nurse Notes:

## 2022-03-23 NOTE — Progress Notes (Signed)
Established Patient Office Visit  Subjective    Patient ID: Claudia Stein, female    DOB: 01-05-1934  Age: 88 y.o. MRN: [id number]  CC: No chief complaint on file.   HPI KHORI UNDERBERG presents for follow up of chronic med issues.    Outpatient Encounter Medications as of 03/23/2022  Medication Sig   amLODipine (NORVASC) 10 MG tablet Take 1 tablet (10 mg total) by mouth daily.   Calcium Carbonate-Vitamin D (CALCIUM PLUS VITAMIN D PO) Take 1 tablet by mouth daily.     carvedilol (COREG) 12.5 MG tablet Take 1 tablet (12.5 mg total) by mouth 2 (two) times daily with a meal.   cholecalciferol (VITAMIN D3) 25 MCG (1000 UNIT) tablet Take 1,000 Units by mouth daily.   clotrimazole (LOTRIMIN) 1 % cream Apply 1 application topically 2 (two) times daily.   latanoprost (XALATAN) 0.005 % ophthalmic solution Place 1 drop into both eyes at bedtime.    levothyroxine (SYNTHROID) 50 MCG tablet Take 1 tablet (50 mcg total) by mouth daily before breakfast.   Multiple Vitamins-Minerals (PRESERVISION AREDS 2 PO) Take 1 tablet by mouth in the morning and at bedtime.    simvastatin (ZOCOR) 5 MG tablet Take 1 tablet (5 mg total) by mouth at bedtime.   vitamin B-12 (CYANOCOBALAMIN) 1000 MCG tablet Take 1,000 mcg by mouth daily.     vitamin C (ASCORBIC ACID) 500 MG tablet Take 500 mg by mouth daily.     No facility-administered encounter medications on file as of 03/23/2022.    Past Medical History:  Diagnosis Date   Bruises easily    Cancer ([employer]) 04-16-09, 2012   Breast- bilateral   Hyperlipidemia    Hypertension    PONV (postoperative nausea and vomiting)    Thyroid disease     Past Surgical History:  Procedure Laterality Date   APPENDECTOMY     BREAST SURGERY  04-16-09   left  Lumpectomy   FRACTURE SURGERY     broke right ankle in the 80's   MASTECTOMY  November 2011   right breast   MASTECTOMY W/ SENTINEL NODE BIOPSY  03/22/2011   Procedure: MASTECTOMY WITH SENTINEL LYMPH NODE BIOPSY;   Surgeon: Shann Medal, MD;  Location: [facility];  Service: General;  Laterality: Left;  left mastectomy, sential lymph node biopsy, left axilla    Family History  Problem Relation Age of Onset   Heart disease Mother 52   Cancer Paternal Aunt        colon    Social History   Socioeconomic History   Marital status: Married    Spouse name: Not on file   Number of children: Not on file   Years of education: Not on file   Highest education level: Not on file  Occupational History   Not on file  Tobacco Use   Smoking status: Never    Passive exposure: Never   Smokeless tobacco: Never  Substance and Sexual Activity   Alcohol use: No   Drug use: No   Sexual activity: Not Currently  Other Topics Concern   Not on file  Social History Narrative   Not on file   Social Determinants of Health   Financial Resource Strain: Not on file  Food Insecurity: Not on file  Transportation Needs: Not on file  Physical Activity: Not on file  Stress: Not on file  Social Connections: Not on file  Intimate Partner Violence: Not on file    Review of Systems  All other systems reviewed and are negative.       Objective    BP (!) 151/72   Pulse 62   Temp 98.1 F (36.7 C) (Oral)   Resp 16   Wt 124 lb 3.2 oz (56.3 kg)   SpO2 95%   BMI 25.09 kg/m   Physical Exam Vitals and nursing note reviewed.  Constitutional:      General: She is not in acute distress. HENT:     Head: Normocephalic.  Eyes:     Comments: Ptosis of right lid  Cardiovascular:     Rate and Rhythm: Normal rate and regular rhythm.  Pulmonary:     Effort: Pulmonary effort is normal.     Breath sounds: Normal breath sounds.  Abdominal:     Palpations: Abdomen is soft.     Tenderness: There is no abdominal tenderness.  Musculoskeletal:     Comments: Utilizing cane for stability  Neurological:     General: No focal deficit present.     Mental Status: She is alert and oriented to person, place, and time.          Assessment & Plan:   1. Encounter for Medicare annual wellness exam   2. Hypothyroidism, unspecified type Monitoring labs ordered - TSH - T4, Free  3. Essential hypertension Elevated reading. Continue and monitor  4. Ptosis of right eyelid Management as per consultant  Return in 1 year (on 03/24/2023).   Becky Sax, MD

## 2022-03-24 LAB — TSH: TSH: 8.07 u[IU]/mL — ABNORMAL HIGH (ref 0.450–4.500)

## 2022-03-24 LAB — T4, FREE: Free T4: 1.48 ng/dL (ref 0.82–1.77)

## 2022-03-28 ENCOUNTER — Encounter: Payer: Self-pay | Admitting: Family Medicine

## 2022-04-16 ENCOUNTER — Other Ambulatory Visit: Payer: Self-pay | Admitting: Family Medicine

## 2022-06-21 ENCOUNTER — Other Ambulatory Visit: Payer: Self-pay | Admitting: Family Medicine

## 2022-06-22 MED ORDER — CARVEDILOL 12.5 MG PO TABS: 12.5000 mg | ORAL_TABLET | Freq: Two times a day (BID) | ORAL | 0 refills | Status: DC

## 2022-07-22 ENCOUNTER — Ambulatory Visit: Payer: Medicare HMO | Admitting: Family Medicine

## 2022-07-28 ENCOUNTER — Ambulatory Visit (INDEPENDENT_AMBULATORY_CARE_PROVIDER_SITE_OTHER): Payer: Medicare Other | Admitting: Family Medicine

## 2022-07-28 ENCOUNTER — Encounter: Payer: Self-pay | Admitting: Family Medicine

## 2022-07-28 VITALS — BP 130/60 | HR 60 | Temp 97.7°F | Resp 16 | Ht 59.0 in | Wt 111.0 lb

## 2022-07-28 DIAGNOSIS — E785 Hyperlipidemia, unspecified: Secondary | ICD-10-CM

## 2022-07-28 DIAGNOSIS — I10 Essential (primary) hypertension: Secondary | ICD-10-CM

## 2022-07-28 DIAGNOSIS — L292 Pruritus vulvae: Secondary | ICD-10-CM

## 2022-07-28 DIAGNOSIS — R634 Abnormal weight loss: Secondary | ICD-10-CM | POA: Diagnosis not present

## 2022-07-28 DIAGNOSIS — R1310 Dysphagia, unspecified: Secondary | ICD-10-CM

## 2022-07-28 DIAGNOSIS — E039 Hypothyroidism, unspecified: Secondary | ICD-10-CM | POA: Diagnosis not present

## 2022-07-28 NOTE — Progress Notes (Signed)
Established Patient Office Visit  Subjective    Patient ID: Claudia Stein, female    DOB: 1933-09-12  Age: 89 y.o. MRN: [id number]  CC:  Chief Complaint  Patient presents with   Hypertension    HPI Claudia Stein presents for follow up of hypertension and hypothyroid. Patient complains of weight loss. Of note daughter relates that she has been "choking" when she has been eating. She has been also worried about her grandson.    Outpatient Encounter Medications as of 07/28/2022  Medication Sig   amLODipine (NORVASC) 10 MG tablet Take 1 tablet (10 mg total) by mouth daily.   Calcium Carbonate-Vitamin D (CALCIUM PLUS VITAMIN D PO) Take 1 tablet by mouth daily.     carvedilol (COREG) 12.5 MG tablet Take 1 tablet (12.5 mg total) by mouth 2 (two) times daily with a meal.   cholecalciferol (VITAMIN D3) 25 MCG (1000 UNIT) tablet Take 1,000 Units by mouth daily.   latanoprost (XALATAN) 0.005 % ophthalmic solution Place 1 drop into both eyes at bedtime.    levothyroxine (SYNTHROID) 50 MCG tablet TAKE 1 TABLET BY MOUTH ONCE DAILY BEFORE BREAKFAST   Multiple Vitamins-Minerals (PRESERVISION AREDS 2 PO) Take 1 tablet by mouth in the morning and at bedtime.    simvastatin (ZOCOR) 5 MG tablet TAKE 1 TABLET BY MOUTH AT BEDTIME   vitamin B-12 (CYANOCOBALAMIN) 1000 MCG tablet Take 1,000 mcg by mouth daily.     vitamin C (ASCORBIC ACID) 500 MG tablet Take 500 mg by mouth daily.     clotrimazole (LOTRIMIN) 1 % cream Apply 1 application topically 2 (two) times daily. (Patient not taking: Reported on 07/28/2022)   No facility-administered encounter medications on file as of 07/28/2022.    Past Medical History:  Diagnosis Date   Bruises easily    Cancer ([facility]) 04-16-09, 2012   Breast- bilateral   Hyperlipidemia    Hypertension    PONV (postoperative nausea and vomiting)    Thyroid disease     Past Surgical History:  Procedure Laterality Date   APPENDECTOMY     BREAST SURGERY  04-16-09   left   Lumpectomy   FRACTURE SURGERY     broke right ankle in the 80's   MASTECTOMY  November 2011   right breast   MASTECTOMY W/ SENTINEL NODE BIOPSY  03/22/2011   Procedure: MASTECTOMY WITH SENTINEL LYMPH NODE BIOPSY;  Surgeon: Shann Medal, MD;  Location: [facility];  Service: General;  Laterality: Left;  left mastectomy, sential lymph node biopsy, left axilla    Family History  Problem Relation Age of Onset   Heart disease Mother 43   Cancer Paternal Aunt        colon    Social History   Socioeconomic History   Marital status: Married    Spouse name: Not on file   Number of children: Not on file   Years of education: Not on file   Highest education level: Not on file  Occupational History   Not on file  Tobacco Use   Smoking status: Never    Passive exposure: Never   Smokeless tobacco: Never  Substance and Sexual Activity   Alcohol use: No   Drug use: No   Sexual activity: Not Currently  Other Topics Concern   Not on file  Social History Narrative   Not on file   Social Determinants of Health   Financial Resource Strain: Not on file  Food Insecurity: Not on file  Transportation  Needs: Not on file  Physical Activity: Not on file  Stress: Not on file  Social Connections: Not on file  Intimate Partner Violence: Not on file    Review of Systems  All other systems reviewed and are negative.       Objective    BP (!) 145/70 (BP Location: Right Arm, Patient Position: Sitting, Cuff Size: Normal)   Pulse 60   Temp 97.7 F (36.5 C) (Oral)   Resp 16   Ht 4\' 11"  (1.499 m)   Wt 111 lb (50.3 kg)   SpO2 95%   BMI 22.42 kg/m   Physical Exam Vitals and nursing note reviewed.  Constitutional:      General: She is not in acute distress. HENT:     Head: Normocephalic.  Eyes:     Comments: Ptosis of right lid - improved from previous visit  Cardiovascular:     Rate and Rhythm: Normal rate and regular rhythm.  Pulmonary:     Effort: Pulmonary effort is normal.      Breath sounds: Normal breath sounds.  Abdominal:     Palpations: Abdomen is soft.     Tenderness: There is no abdominal tenderness.  Neurological:     General: No focal deficit present.     Mental Status: She is alert and oriented to person, place, and time.         Assessment & Plan:   1. Essential hypertension Appears stable. Monitoring labs ordered.  - CMP14+EGFR  2. Hypothyroidism, unspecified type Appears stable. Monitoring labs ordered - TSH - T4, Free  3. Vulvar itching Continue present management. Patient defers further eval/mgt at this time  4. Hyperlipidemia, unspecified hyperlipidemia type continue  5. Loss of weight Monitoring labs ordered. Recommended regular meals as well as supplementation twice daily with ensure - TSH - T4, Free - CBC with Differential - CMP14+EGFR  6. Dysphagia, unspecified type Speech therapy/swallowing study referral placed  No follow-ups on file.   Becky Sax, MD

## 2022-07-28 NOTE — Progress Notes (Signed)
F/u HTN and thyroid  Weight loss   External vaginal itching

## 2022-07-30 ENCOUNTER — Encounter: Payer: Self-pay | Admitting: Family Medicine

## 2022-07-30 ENCOUNTER — Other Ambulatory Visit: Payer: Self-pay | Admitting: Family Medicine

## 2022-07-30 LAB — CBC WITH DIFFERENTIAL/PLATELET
Basophils Absolute: 0 10*3/uL (ref 0.0–0.2)
Basos: 0 %
EOS (ABSOLUTE): 0.1 10*3/uL (ref 0.0–0.4)
Eos: 2 %
Hematocrit: 40.2 % (ref 34.0–46.6)
Hemoglobin: 13.3 g/dL (ref 11.1–15.9)
Immature Grans (Abs): 0 10*3/uL (ref 0.0–0.1)
Immature Granulocytes: 0 %
Lymphocytes Absolute: 1.7 10*3/uL (ref 0.7–3.1)
Lymphs: 35 %
MCH: 28.1 pg (ref 26.6–33.0)
MCHC: 33.1 g/dL (ref 31.5–35.7)
MCV: 85 fL (ref 79–97)
Monocytes Absolute: 0.4 10*3/uL (ref 0.1–0.9)
Monocytes: 8 %
Neutrophils Absolute: 2.6 10*3/uL (ref 1.4–7.0)
Neutrophils: 55 %
Platelets: 268 10*3/uL (ref 150–450)
RBC: 4.73 x10E6/uL (ref 3.77–5.28)
RDW: 13.4 % (ref 11.7–15.4)
WBC: 4.8 10*3/uL (ref 3.4–10.8)

## 2022-07-30 LAB — CMP14+EGFR
ALT: 12 IU/L (ref 0–32)
AST: 11 IU/L (ref 0–40)
Albumin/Globulin Ratio: 1.2 (ref 1.2–2.2)
Albumin: 3.9 g/dL (ref 3.7–4.7)
Alkaline Phosphatase: 145 IU/L — ABNORMAL HIGH (ref 44–121)
BUN/Creatinine Ratio: 15 (ref 12–28)
BUN: 12 mg/dL (ref 8–27)
Bilirubin Total: 0.4 mg/dL (ref 0.0–1.2)
CO2: 22 mmol/L (ref 20–29)
Calcium: 10.1 mg/dL (ref 8.7–10.3)
Chloride: 101 mmol/L (ref 96–106)
Creatinine, Ser: 0.78 mg/dL (ref 0.57–1.00)
Globulin, Total: 3.3 g/dL (ref 1.5–4.5)
Glucose: 87 mg/dL (ref 70–99)
Potassium: 4.2 mmol/L (ref 3.5–5.2)
Sodium: 140 mmol/L (ref 134–144)
Total Protein: 7.2 g/dL (ref 6.0–8.5)
eGFR: 74 mL/min/{1.73_m2} (ref >59)

## 2022-07-30 LAB — TSH: TSH: 4.72 u[IU]/mL — ABNORMAL HIGH (ref 0.450–4.500)

## 2022-07-30 LAB — T4, FREE: Free T4: 1.53 ng/dL (ref 0.82–1.77)

## 2022-07-30 MED ORDER — LEVOTHYROXINE SODIUM 75 MCG PO TABS: 75.0000 ug | ORAL_TABLET | Freq: Every day | ORAL | 0 refills | Status: DC

## 2022-08-02 ENCOUNTER — Telehealth (HOSPITAL_COMMUNITY): Payer: Self-pay | Admitting: *Deleted

## 2022-08-02 ENCOUNTER — Other Ambulatory Visit (HOSPITAL_COMMUNITY): Payer: Self-pay | Admitting: *Deleted

## 2022-08-02 DIAGNOSIS — R1310 Dysphagia, unspecified: Secondary | ICD-10-CM

## 2022-08-02 NOTE — Telephone Encounter (Signed)
Contacted patient about scheduling OP MBS, she requested that I wait for a call back from her daughter in law to schedule. RKEEL

## 2022-08-09 ENCOUNTER — Other Ambulatory Visit: Payer: Self-pay | Admitting: Family Medicine

## 2022-08-10 ENCOUNTER — Ambulatory Visit (HOSPITAL_COMMUNITY)
Admission: RE | Admit: 2022-08-10 | Discharge: 2022-08-10 | Disposition: A | Discharge status: Home / Self Care | Payer: Medicare Other | Source: Ambulatory Visit | Attending: Family Medicine | Admitting: Family Medicine

## 2022-08-10 DIAGNOSIS — R1310 Dysphagia, unspecified: Secondary | ICD-10-CM

## 2022-08-10 DIAGNOSIS — R634 Abnormal weight loss: Secondary | ICD-10-CM

## 2022-08-10 NOTE — Therapy (Addendum)
Modified Barium Swallow Study  Patient Details  Name: KENTARA NARO MRN: [id number] Date of Birth: [birth year]  Today's Date: 08/10/2022  Modified Barium Swallow completed.  Full report located under Chart Review in the Imaging Section.  History of Present Illness Allean Schwiebert is an 89 y.o. female with PMH: HLD, HTN, PONV, thyroid disease, bilateral breast cancer s/p masectomy. She presented with her daughter in law to this outpatient MBS secondary to patient with complaints of some choking incidents as well as unintended weight loss. During interview with Ms. Friar in the radiology suite, she reported one incident of difficulty swallowing a piece of steak and another where she had difficulty swallowing bacon. Patient denied any major concerns but that her doctor wanted her to get this test.   Clinical Impression Patients with an oropharyngeal swallow that is WNL. Swallow initiation was timely, good airway protection with full epiglottic inversion, no significant residuals in oral cavity or pharynx post swallows. No retrograde movement of barium observed. Patient did exhibit a prominent cricopharyngeal bar which only minimally impacted barium flow. SLP suspects that patient's intermittent "choking" incidents are related to her swallowing boluses that she is not able to fully masticate, resulting in slow passage through pharynx and PES. Aspiration risk is very low at this time.  Swallow Evaluation Recommendations Recommendations: PO diet PO Diet Recommendation: Dysphagia 3 (Mechanical soft);Thin liquids (Level 0) Liquid Administration via: Cup;Straw Medication Administration: Whole meds with liquid Supervision: Patient able to self-feed Postural changes: Position pt fully upright for meals     Angela Nevin, MA, CCC-SLP Speech Therapy

## 2022-08-27 ENCOUNTER — Encounter: Payer: Self-pay | Admitting: *Deleted

## 2022-09-21 ENCOUNTER — Ambulatory Visit (INDEPENDENT_AMBULATORY_CARE_PROVIDER_SITE_OTHER): Payer: Medicare Other | Admitting: Family Medicine

## 2022-09-21 ENCOUNTER — Encounter: Payer: Self-pay | Admitting: Family Medicine

## 2022-09-21 VITALS — BP 122/52 | HR 64 | Temp 97.7°F | Resp 16 | Ht 59.0 in | Wt 115.0 lb

## 2022-09-21 DIAGNOSIS — I10 Essential (primary) hypertension: Secondary | ICD-10-CM | POA: Diagnosis not present

## 2022-09-21 NOTE — Progress Notes (Unsigned)
Patient here for BP/HTN

## 2022-09-23 ENCOUNTER — Encounter: Payer: Self-pay | Admitting: Family Medicine

## 2022-09-23 NOTE — Progress Notes (Signed)
Established Patient Office Visit  Subjective    Patient ID: Claudia Stein, female    DOB: July 21, 1933  Age: 89 y.o. MRN: [id number]  CC:  Chief Complaint  Patient presents with   Hypertension    HPI Claudia Stein presents for follow up of hypertension. Patient denies acute complaints or concerns.    Outpatient Encounter Medications as of 09/21/2022  Medication Sig   amLODipine (NORVASC) 10 MG tablet Take 1 tablet by mouth once daily   Calcium Carbonate-Vitamin D (CALCIUM PLUS VITAMIN D PO) Take 1 tablet by mouth daily.     carvedilol (COREG) 12.5 MG tablet Take 1 tablet (12.5 mg total) by mouth 2 (two) times daily with a meal.   cholecalciferol (VITAMIN D3) 25 MCG (1000 UNIT) tablet Take 1,000 Units by mouth daily.   latanoprost (XALATAN) 0.005 % ophthalmic solution Place 1 drop into both eyes at bedtime.    levothyroxine (SYNTHROID) 75 MCG tablet Take 1 tablet (75 mcg total) by mouth daily.   Multiple Vitamins-Minerals (PRESERVISION AREDS 2 PO) Take 1 tablet by mouth in the morning and at bedtime.    simvastatin (ZOCOR) 5 MG tablet TAKE 1 TABLET BY MOUTH AT BEDTIME   vitamin B-12 (CYANOCOBALAMIN) 1000 MCG tablet Take 1,000 mcg by mouth daily.     vitamin C (ASCORBIC ACID) 500 MG tablet Take 500 mg by mouth daily.     clotrimazole (LOTRIMIN) 1 % cream Apply 1 application topically 2 (two) times daily.   No facility-administered encounter medications on file as of 09/21/2022.    Past Medical History:  Diagnosis Date   Bruises easily    Cancer ([facility]) 04-16-09, 2012   Breast- bilateral   Hyperlipidemia    Hypertension    PONV (postoperative nausea and vomiting)    Thyroid disease     Past Surgical History:  Procedure Laterality Date   APPENDECTOMY     BREAST SURGERY  04-16-09   left  Lumpectomy   FRACTURE SURGERY     broke right ankle in the 80's   MASTECTOMY  November 2011   right breast   MASTECTOMY W/ SENTINEL NODE BIOPSY  03/22/2011   Procedure: MASTECTOMY WITH  SENTINEL LYMPH NODE BIOPSY;  Surgeon: Kandis Cocking, MD;  Location: [facility];  Service: General;  Laterality: Left;  left mastectomy, sential lymph node biopsy, left axilla    Family History  Problem Relation Age of Onset   Heart disease Mother 26   Cancer Paternal Aunt        colon    Social History   Socioeconomic History   Marital status: Widowed    Spouse name: Not on file   Number of children: Not on file   Years of education: Not on file   Highest education level: Not on file  Occupational History   Not on file  Tobacco Use   Smoking status: Never    Passive exposure: Never   Smokeless tobacco: Never  Substance and Sexual Activity   Alcohol use: No   Drug use: No   Sexual activity: Not Currently  Other Topics Concern   Not on file  Social History Narrative   Not on file   Social Determinants of Health   Financial Resource Strain: Not on file  Food Insecurity: Not on file  Transportation Needs: Not on file  Physical Activity: Not on file  Stress: Not on file  Social Connections: Not on file  Intimate Partner Violence: Not on file  Review of Systems  All other systems reviewed and are negative.       Objective    BP (!) 122/52 Comment: home BP  Pulse 64   Temp 97.7 F (36.5 C) (Oral)   Resp 16   Ht 4\' 11"  (1.499 m)   Wt 115 lb (52.2 kg)   SpO2 97%   BMI 23.23 kg/m   Physical Exam Vitals and nursing note reviewed.  Constitutional:      General: She is not in acute distress. HENT:     Head: Normocephalic.  Cardiovascular:     Rate and Rhythm: Normal rate and regular rhythm.  Pulmonary:     Effort: Pulmonary effort is normal.     Breath sounds: Normal breath sounds.  Abdominal:     Palpations: Abdomen is soft.     Tenderness: There is no abdominal tenderness.  Neurological:     General: No focal deficit present.     Mental Status: She is alert and oriented to person, place, and time.         Assessment & Plan:   1. Essential  hypertension Improved and appears stable. continue    Return in about 3 months (around 12/22/2022).   Tommie Raymond, MD

## 2022-09-29 ENCOUNTER — Ambulatory Visit: Payer: Medicare Other | Admitting: Family Medicine

## 2022-09-29 ENCOUNTER — Emergency Department (HOSPITAL_COMMUNITY): Payer: Medicare Other

## 2022-09-29 ENCOUNTER — Emergency Department (HOSPITAL_COMMUNITY)
Admission: EM | Admit: 2022-09-29 | Discharge: 2022-09-29 | Disposition: A | Discharge status: Home / Self Care | Payer: Medicare Other | Attending: Emergency Medicine | Admitting: Emergency Medicine

## 2022-09-29 ENCOUNTER — Other Ambulatory Visit: Payer: Self-pay

## 2022-09-29 DIAGNOSIS — M8580 Other specified disorders of bone density and structure, unspecified site: Secondary | ICD-10-CM | POA: Diagnosis not present

## 2022-09-29 DIAGNOSIS — H353132 Nonexudative age-related macular degeneration, bilateral, intermediate dry stage: Secondary | ICD-10-CM | POA: Diagnosis not present

## 2022-09-29 DIAGNOSIS — I2510 Atherosclerotic heart disease of native coronary artery without angina pectoris: Secondary | ICD-10-CM | POA: Diagnosis not present

## 2022-09-29 DIAGNOSIS — Z79899 Other long term (current) drug therapy: Secondary | ICD-10-CM | POA: Diagnosis not present

## 2022-09-29 DIAGNOSIS — R079 Chest pain, unspecified: Secondary | ICD-10-CM | POA: Insufficient documentation

## 2022-09-29 DIAGNOSIS — I10 Essential (primary) hypertension: Secondary | ICD-10-CM | POA: Diagnosis not present

## 2022-09-29 DIAGNOSIS — R0789 Other chest pain: Secondary | ICD-10-CM | POA: Diagnosis not present

## 2022-09-29 DIAGNOSIS — I700 Atherosclerosis of aorta: Secondary | ICD-10-CM | POA: Diagnosis not present

## 2022-09-29 DIAGNOSIS — K449 Diaphragmatic hernia without obstruction or gangrene: Secondary | ICD-10-CM | POA: Insufficient documentation

## 2022-09-29 DIAGNOSIS — R911 Solitary pulmonary nodule: Secondary | ICD-10-CM | POA: Diagnosis not present

## 2022-09-29 DIAGNOSIS — R778 Other specified abnormalities of plasma proteins: Secondary | ICD-10-CM | POA: Diagnosis not present

## 2022-09-29 DIAGNOSIS — Z853 Personal history of malignant neoplasm of breast: Secondary | ICD-10-CM | POA: Insufficient documentation

## 2022-09-29 DIAGNOSIS — H401131 Primary open-angle glaucoma, bilateral, mild stage: Secondary | ICD-10-CM | POA: Diagnosis not present

## 2022-09-29 DIAGNOSIS — E785 Hyperlipidemia, unspecified: Secondary | ICD-10-CM | POA: Insufficient documentation

## 2022-09-29 LAB — CBC
HCT: 44.1 % (ref 36.0–46.0)
Hemoglobin: 14.7 g/dL (ref 12.0–15.0)
MCH: 28.2 pg (ref 26.0–34.0)
MCHC: 33.3 g/dL (ref 30.0–36.0)
MCV: 84.6 fL (ref 80.0–100.0)
Platelets: 232 10*3/uL (ref 150–400)
RBC: 5.21 MIL/uL — ABNORMAL HIGH (ref 3.87–5.11)
RDW: 14.6 % (ref 11.5–15.5)
WBC: 6.3 10*3/uL (ref 4.0–10.5)
nRBC: 0 % (ref 0.0–0.2)

## 2022-09-29 LAB — BASIC METABOLIC PANEL
Anion gap: 13 (ref 5–15)
BUN: 12 mg/dL (ref 8–23)
CO2: 26 mmol/L (ref 22–32)
Calcium: 10.8 mg/dL — ABNORMAL HIGH (ref 8.9–10.3)
Chloride: 98 mmol/L (ref 98–111)
Creatinine, Ser: 0.72 mg/dL (ref 0.44–1.00)
GFR, Estimated: >60 mL/min (ref >60)
Glucose, Bld: 107 mg/dL — ABNORMAL HIGH (ref 70–99)
Potassium: 3.9 mmol/L (ref 3.5–5.1)
Sodium: 137 mmol/L (ref 135–145)

## 2022-09-29 LAB — HEPATIC FUNCTION PANEL
ALT: 13 U/L (ref 0–44)
AST: 20 U/L (ref 15–41)
Albumin: 3.6 g/dL (ref 3.5–5.0)
Alkaline Phosphatase: 104 U/L (ref 38–126)
Bilirubin, Direct: 0.2 mg/dL (ref 0.0–0.2)
Indirect Bilirubin: 0.5 mg/dL (ref 0.3–0.9)
Total Bilirubin: 0.7 mg/dL (ref 0.3–1.2)
Total Protein: 7.9 g/dL (ref 6.5–8.1)

## 2022-09-29 LAB — TROPONIN I (HIGH SENSITIVITY)
Troponin I (High Sensitivity): 11 ng/L (ref <18)
Troponin I (High Sensitivity): 11 ng/L (ref <18)

## 2022-09-29 LAB — MAGNESIUM: Magnesium: 1.8 mg/dL (ref 1.7–2.4)

## 2022-09-29 LAB — BRAIN NATRIURETIC PEPTIDE: B Natriuretic Peptide: 142.8 pg/mL — ABNORMAL HIGH (ref 0.0–100.0)

## 2022-09-29 LAB — LIPASE, BLOOD: Lipase: 25 U/L (ref 11–51)

## 2022-09-29 MED ORDER — ASPIRIN 81 MG PO CHEW
324.0000 mg | CHEWABLE_TABLET | Freq: Once | ORAL | Status: AC
  Administered 2022-09-29: 324 mg via ORAL
  Filled 2022-09-29: qty 4

## 2022-09-29 MED ORDER — IOHEXOL 350 MG/ML SOLN
75.0000 mL | Freq: Once | INTRAVENOUS | Status: AC | PRN
  Administered 2022-09-29: 75 mL via INTRAVENOUS

## 2022-09-29 MED ORDER — PANTOPRAZOLE SODIUM 20 MG PO TBEC: 20.0000 mg | DELAYED_RELEASE_TABLET | Freq: Every day | ORAL | 2 refills | Status: DC

## 2022-09-29 MED ORDER — FENTANYL CITRATE PF 50 MCG/ML IJ SOSY
25.0000 ug | PREFILLED_SYRINGE | Freq: Once | INTRAMUSCULAR | Status: AC
  Administered 2022-09-29: 25 ug via INTRAVENOUS
  Filled 2022-09-29: qty 1

## 2022-09-29 MED ORDER — NITROGLYCERIN 2 % TD OINT
1.0000 [in_us] | TOPICAL_OINTMENT | Freq: Once | TRANSDERMAL | Status: AC
  Administered 2022-09-29: 1 [in_us] via TOPICAL
  Filled 2022-09-29: qty 1

## 2022-09-29 NOTE — Discharge Instructions (Addendum)
On your CT scan, you have a hiatal hernia.  This is a portion of your stomach protruding into your chest.  I suspect this is the cause of your symptoms.  A new prescription was sent to your pharmacy for medication that can help lower acid and hopefully minimize her symptoms.  You can follow-up with a heart doctor by calling the number below.  The number for a GI doctor is below as well.    You have a lung nodule in your right lower lung.  This is simply something to be aware of.  Talk to your primary care doctor about this.  He/she will likely do repeat imaging in 3 to 6 months.  Return to the emergency department for any new or worsening symptoms of concern.

## 2022-09-29 NOTE — ED Triage Notes (Signed)
Pt. Stated, Ive had chest pain for about a week. No other symptoms

## 2022-09-29 NOTE — ED Notes (Signed)
Pt. In bathroom  

## 2022-09-29 NOTE — ED Provider Notes (Signed)
[place] EMERGENCY DEPARTMENT AT [facility] Provider Note   CSN: [id number] Arrival date & time: 09/29/22  1045     History  Chief Complaint  Patient presents with   Chest Pain    Claudia Stein is a 89 y.o. female.   Chest Pain Patient presents for chest pain.  Medical history includes HTN, HLD, failure to thrive, remote breast cancer.  She has never seen a cardiologist.  Over the past week, she has had intermittent anterior bilateral chest pain.  Symptoms have been improved with Tylenol.  Yesterday, she tried some Alka-Seltzer which she also feels like helped her symptoms.  This morning, she had worsened pain starting at 6 AM.  Pain has been persistent since that time.  Pain does not radiate.  She denies any associated nausea, shortness of breath, diaphoresis.  She has not noticed that is worse with exertion or eating.  Pain seems to stay about the same regardless of what she does.  Current pain is 8/10 in severity.  It is slightly improved from earlier this morning.  Patient lives alone and independently.  Her son and daughter-in-law live close by.  She will occasionally stay with them.  She was staying with them over the weekend due to concern of storms.     Home Medications Prior to Admission medications   Medication Sig Start Date End Date Taking? Authorizing Provider  pantoprazole (PROTONIX) 20 MG tablet Take 1 tablet (20 mg total) by mouth daily. 09/29/22 12/28/22 Yes Gloris Manchester, MD  amLODipine (NORVASC) 10 MG tablet Take 1 tablet by mouth once daily 08/10/22   Georganna Skeans, MD  Calcium Carbonate-Vitamin D (CALCIUM PLUS VITAMIN D PO) Take 1 tablet by mouth daily.      [provider]  carvedilol (COREG) 12.5 MG tablet Take 1 tablet (12.5 mg total) by mouth 2 (two) times daily with a meal. 06/22/22   Georganna Skeans, MD  cholecalciferol (VITAMIN D3) 25 MCG (1000 UNIT) tablet Take 1,000 Units by mouth daily.    [provider]  latanoprost (XALATAN)  0.005 % ophthalmic solution Place 1 drop into both eyes at bedtime.  12/10/19   [provider]  levothyroxine (SYNTHROID) 75 MCG tablet Take 1 tablet (75 mcg total) by mouth daily. 07/30/22   Georganna Skeans, MD  Multiple Vitamins-Minerals (PRESERVISION AREDS 2 PO) Take 1 tablet by mouth in the morning and at bedtime.     [provider]  simvastatin (ZOCOR) 5 MG tablet TAKE 1 TABLET BY MOUTH AT BEDTIME 04/16/22   Georganna Skeans, MD  vitamin B-12 (CYANOCOBALAMIN) 1000 MCG tablet Take 1,000 mcg by mouth daily.      [provider]  vitamin C (ASCORBIC ACID) 500 MG tablet Take 500 mg by mouth daily.      [provider]      Allergies    Haemophilus influenzae vaccines    Review of Systems   Review of Systems  Cardiovascular:  Positive for chest pain.  All other systems reviewed and are negative.   Physical Exam Updated Vital Signs BP 138/69   Pulse 64   Temp 97.6 F (36.4 C)   Resp 12   Ht 4\' 11"  (1.499 m)   Wt 52.2 kg   SpO2 98%   BMI 23.23 kg/m  Physical Exam Vitals and nursing note reviewed.  Constitutional:      General: She is not in acute distress.    Appearance: She is well-developed. She is not ill-appearing, toxic-appearing  or diaphoretic.  HENT:     Head: Normocephalic and atraumatic.  Eyes:     Conjunctiva/sclera: Conjunctivae normal.  Cardiovascular:     Rate and Rhythm: Normal rate and regular rhythm.     Heart sounds: No murmur heard. Pulmonary:     Effort: Pulmonary effort is normal. No respiratory distress.     Breath sounds: Normal breath sounds. No decreased breath sounds, wheezing or rhonchi.  Chest:     Chest wall: No tenderness.  Abdominal:     Palpations: Abdomen is soft.     Tenderness: There is no abdominal tenderness.  Musculoskeletal:        General: No swelling.     Cervical back: Normal range of motion and neck supple.     Right lower leg: No edema.     Left lower leg: No edema.  Skin:    General: Skin  is warm and dry.     Capillary Refill: Capillary refill takes less than 2 seconds.     Coloration: Skin is not cyanotic or pale.  Neurological:     General: No focal deficit present.     Mental Status: She is alert and oriented to person, place, and time.  Psychiatric:        Mood and Affect: Mood normal.        Behavior: Behavior normal.     ED Results / Procedures / Treatments   Labs (all labs ordered are listed, but only abnormal results are displayed) Labs Reviewed  BASIC METABOLIC PANEL - Abnormal; Notable for the following components:      Result Value   Glucose, Bld 107 (*)    Calcium 10.8 (*)    All other components within normal limits  CBC - Abnormal; Notable for the following components:   RBC 5.21 (*)    All other components within normal limits  BRAIN NATRIURETIC PEPTIDE - Abnormal; Notable for the following components:   B Natriuretic Peptide 142.8 (*)    All other components within normal limits  HEPATIC FUNCTION PANEL  MAGNESIUM  LIPASE, BLOOD  TROPONIN I (HIGH SENSITIVITY)  TROPONIN I (HIGH SENSITIVITY)    EKG EKG Interpretation  Date/Time:  Wednesday Sep 29 2022 11:56:30 EDT Ventricular Rate:  72 PR Interval:  183 QRS Duration: 159 QT Interval:  468 QTC Calculation: 513 R Axis:   65 Text Interpretation: Sinus rhythm IVCD, consider atypical LBBB Confirmed by Gloris Manchester [phone]) on 09/29/2022 12:42:47 PM  Radiology CT Angio Chest PE W and/or Wo Contrast  Result Date: 09/29/2022 CLINICAL DATA:  Chest pain. EXAM: CT ANGIOGRAPHY CHEST WITH CONTRAST TECHNIQUE: Multidetector CT imaging of the chest was performed using the standard protocol during bolus administration of intravenous contrast. Multiplanar CT image reconstructions and MIPs were obtained to evaluate the vascular anatomy. RADIATION DOSE REDUCTION: This exam was performed according to the departmental dose-optimization program which includes automated exposure control, adjustment of the mA and/or kV  according to patient size and/or use of iterative reconstruction technique. CONTRAST:  75mL OMNIPAQUE IOHEXOL 350 MG/ML SOLN COMPARISON:  None Available. FINDINGS: Cardiovascular: Satisfactory opacification of the pulmonary arteries to the segmental level. No evidence of pulmonary embolism. Mild cardiomegaly. No pericardial effusion. Mild coronary artery calcifications are noted. Mediastinum/Nodes: Large hiatal hernia is noted. No adenopathy is noted. Thyroid gland is unremarkable. Lungs/Pleura: No pneumothorax or pleural effusion is noted. Mild left posterior basilar subsegmental atelectasis is noted. 12 x 8 mm subpleural nodule is noted laterally in right lower lobe best seen on  image number 76 of series 8. Upper Abdomen: No acute abnormality. Musculoskeletal: No chest wall abnormality. No acute or significant osseous findings. Review of the MIP images confirms the above findings. IMPRESSION: No definite evidence of pulmonary embolus is noted. 12 x 8 mm subpleural nodule noted laterally in right lower lobe. Consider one of the following in 3 months for both low-risk and high-risk individuals: (a) repeat chest CT, (b) follow-up PET-CT, or (c) tissue sampling. This recommendation follows the consensus statement: Guidelines for Management of Incidental Pulmonary Nodules Detected on CT Images: From the Fleischner Society 2017; Radiology 2017; 284:228-243. Large sliding-type hiatal hernia. Mild left posterior basilar subsegmental atelectasis. Mild coronary artery calcifications are noted. Aortic Atherosclerosis (ICD10-I70.0). Electronically Signed   By: Lupita Raider M.D.   On: 09/29/2022 15:00   DG Chest 2 View  Result Date: 09/29/2022 CLINICAL DATA:  Chest pain EXAM: CHEST - 2 VIEW COMPARISON:  X-ray 12/03/2021 and older FINDINGS: Slight elevation of the right hemidiaphragm. Enlarged cardiopericardial silhouette with calcified and tortuous aorta. No consolidation, pneumothorax or effusion. Curvature and  degenerative changes of the spine. Osteopenia. Surgical clips seen in the axillary regions. Extensive overlapping cardiac leads. Possible hiatal hernia. IMPRESSION: Slight elevation of the right hemidiaphragm. Chronic changes.  Possible hiatal hernia. Slight enlarged cardiopericardial silhouette. Electronically Signed   By: Karen Kays M.D.   On: 09/29/2022 13:22    Procedures Procedures    Medications Ordered in ED Medications  aspirin chewable tablet 324 mg (324 mg Oral Given 09/29/22 1209)  nitroGLYCERIN (NITROGLYN) 2 % ointment 1 inch (1 inch Topical Given 09/29/22 1209)  fentaNYL (SUBLIMAZE) injection 25 mcg (25 mcg Intravenous Given 09/29/22 1209)  iohexol (OMNIPAQUE) 350 MG/ML injection 75 mL (75 mLs Intravenous Contrast Given 09/29/22 1437)    ED Course/ Medical Decision Making/ A&P                             Medical Decision Making Amount and/or Complexity of Data Reviewed Labs: ordered. Radiology: ordered.  Risk OTC drugs. Prescription drug management.   This patient presents to the ED for concern of chest pain, this involves an extensive number of treatment options, and is a complaint that carries with it a high risk of complications and morbidity.  The differential diagnosis includes ACS, pericarditis, costochondritis, GERD, PUD, pneumonia, occult rib fractures, PE   Co morbidities that complicate the patient evaluation  HTN, HLD, failure to thrive, remote breast cancer   Additional history obtained:  Additional history obtained from patient's son External records from outside source obtained and reviewed including EMR   Lab Tests:  I Ordered, and personally interpreted labs.  The pertinent results include:   Normal hemoglobin, no leukocytosis, normal kidney function,normal lipase, normal hepatobiliary enzymes, normal troponins x 2, mild hypercalcemia with otherwise normal electrolytes, baseline mild elevation in BNP.   Imaging Studies ordered:  I ordered  imaging studies including chest x-ray, CTA chest I independently visualized and interpreted imaging which showed no acute findings.  A large sliding hiatal hernia is present I agree with the radiologist interpretation   Cardiac Monitoring: / EKG:  The patient was maintained on a cardiac monitor.  I personally viewed and interpreted the cardiac monitored which showed an underlying rhythm of: Sinus rhythm  Problem List / ED Course / Critical interventions / Medication management  Patient presents for chest pain.  Pain has been intermittent over the past week.  She describes it as anterior  bilateral lower chest.  It is not worsened with exertion or eating.  She has no known cardiac history.  She does not see a cardiologist.  On arrival, patient is well-appearing.  EKG does not show any concerning ST segment changes.  Her blood pressure is elevated.  NTG and fentanyl ordered for symptomatic relief.  ASA was ordered for concern of ACS.  Laboratory workup was initiated.  Initial lab work is reassuring.  Initial troponin is normal.  Patient had resolution of symptoms while in the ED.  A CTA of chest was ordered which did not show any PEs, focal opacities, or any other acute findings.  It did show a large sliding hiatal hernia.  I suspect this is the etiology of the patient's recent symptoms.  Patient was informed of this finding.  She is not aware that she has this hernia.  She is not currently on a PPI.  PPI was prescribed.  She was advised to follow-up with cardiology and gastroenterology.  She was discharged in good condition. I ordered medication including ASA, fentanyl, NTG for chest pain Reevaluation of the patient after these medicines showed that the patient resolved I have reviewed the patients home medicines and have made adjustments as needed   Social Determinants of Health:  Lives independently         Final Clinical Impression(s) / ED Diagnoses Final diagnoses:  Chest pain,  unspecified type  Hiatal hernia    Rx / DC Orders ED Discharge Orders          Ordered    pantoprazole (PROTONIX) 20 MG tablet  Daily        09/29/22 1547    Ambulatory referral to Cardiology       Comments: If you have not heard from the Cardiology office within the next 72 hours please call [phone].   09/29/22 1547              Gloris Manchester, MD 09/29/22 [id number]

## 2022-09-29 NOTE — ED Notes (Signed)
Patient verbalizes understanding of discharge instructions. Opportunity for questioning and answers were provided. Pt discharged from ED. 

## 2022-10-01 ENCOUNTER — Telehealth: Payer: Self-pay | Admitting: *Deleted

## 2022-10-01 NOTE — Telephone Encounter (Signed)
Pt called regarding having received a phone call about her lab results.  RNCM advised that she may contact her PCP or visit a urgent care or ER facility for in depth explanation of lab results from MD.  Pt states understanding and will call PCP promptly.

## 2022-10-08 NOTE — Progress Notes (Unsigned)
Patient ID: TIEA CHEUVRONT, female    DOB: [birth year]  MRN: [id number]  CC: Emergency Department Discharge Follow-Up  Subjective: Claudia Stein is a 89 y.o. female who presents for Emergency Department discharge follow-up.  Her concerns today include:  09/29/2022 [facility] Emergency Department at [facility] per MD note: ED Course/ Medical Decision Making/ A&P                           Medical Decision Making Amount and/or Complexity of Data Reviewed Labs: ordered. Radiology: ordered.   Risk OTC drugs. Prescription drug management.     This patient presents to the ED for concern of chest pain, this involves an extensive number of treatment options, and is a complaint that carries with it a high risk of complications and morbidity.  The differential diagnosis includes ACS, pericarditis, costochondritis, GERD, PUD, pneumonia, occult rib fractures, PE     Co morbidities that complicate the patient evaluation   HTN, HLD, failure to thrive, remote breast cancer     Additional history obtained:   Additional history obtained from patient's son External records from outside source obtained and reviewed including EMR     Lab Tests:   I Ordered, and personally interpreted labs.  The pertinent results include:   Normal hemoglobin, no leukocytosis, normal kidney function,normal lipase, normal hepatobiliary enzymes, normal troponins x 2, mild hypercalcemia with otherwise normal electrolytes, baseline mild elevation in BNP.     Imaging Studies ordered:   I ordered imaging studies including chest x-ray, CTA chest I independently visualized and interpreted imaging which showed no acute findings.  A large sliding hiatal hernia is present I agree with the radiologist interpretation     Cardiac Monitoring: / EKG:   The patient was maintained on a cardiac monitor.  I personally viewed and interpreted the cardiac monitored which showed an underlying rhythm of: Sinus rhythm    Problem List / ED Course / Critical interventions / Medication management   Patient presents for chest pain.  Pain has been intermittent over the past week.  She describes it as anterior bilateral lower chest.  It is not worsened with exertion or eating.  She has no known cardiac history.  She does not see a cardiologist.  On arrival, patient is well-appearing.  EKG does not show any concerning ST segment changes.  Her blood pressure is elevated.  NTG and fentanyl ordered for symptomatic relief.  ASA was ordered for concern of ACS.  Laboratory workup was initiated.  Initial lab work is reassuring.  Initial troponin is normal.  Patient had resolution of symptoms while in the ED.  A CTA of chest was ordered which did not show any PEs, focal opacities, or any other acute findings.  It did show a large sliding hiatal hernia.  I suspect this is the etiology of the patient's recent symptoms.  Patient was informed of this finding.  She is not aware that she has this hernia.  She is not currently on a PPI.  PPI was prescribed.  She was advised to follow-up with cardiology and gastroenterology.  She was discharged in good condition. I ordered medication including ASA, fentanyl, NTG for chest pain Reevaluation of the patient after these medicines showed that the patient resolved I have reviewed the patients home medicines and have made adjustments as needed   Rx / DC Orders ED Discharge Orders  Ordered      pantoprazole (PROTONIX) 20 MG tablet  Daily        09/29/22 1547      Ambulatory referral to Cardiology       Comments: If you have not heard from the Cardiology office within the next 72 hours please call [phone].   09/29/22 1547            Follow-Ups  Schedule an appointment with [facility]; To establish care with a cardiologist. Schedule an appointment with [facility] (Gastroenterology); To establish  care with gastroenterology.  Today's visit 10/12/2022: Pantoprazole   Patient Active Problem List   Diagnosis Date Noted   Essential hypertension 12/30/2019   Hyperlipemia 12/30/2019   Acute lower UTI 12/30/2019   Thrombocytopenia (HCC) 12/30/2019   FTT (failure to thrive) in adult 12/30/2019   Hypothyroidism 12/30/2019   Hyponatremia 12/30/2019   Fall at home, initial encounter 12/30/2019   Laceration of scalp 12/30/2019   COVID-19 virus detected 12/30/2019   History of breast cancer, left, left mastectomy - 03/22/2011.  T1a, N0. 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 03/04/2011     Current Outpatient Medications on File Prior to Visit  Medication Sig Dispense Refill   amLODipine (NORVASC) 10 MG tablet Take 1 tablet by mouth once daily 90 tablet 0   Calcium Carbonate-Vitamin D (CALCIUM PLUS VITAMIN D PO) Take 1 tablet by mouth daily.       carvedilol (COREG) 12.5 MG tablet Take 1 tablet (12.5 mg total) by mouth 2 (two) times daily with a meal. 180 tablet 0   cholecalciferol (VITAMIN D3) 25 MCG (1000 UNIT) tablet Take 1,000 Units by mouth daily.     latanoprost (XALATAN) 0.005 % ophthalmic solution Place 1 drop into both eyes at bedtime.      levothyroxine (SYNTHROID) 75 MCG tablet Take 1 tablet (75 mcg total) by mouth daily. 90 tablet 0   Multiple Vitamins-Minerals (PRESERVISION AREDS 2 PO) Take 1 tablet by mouth in the morning and at bedtime.      pantoprazole (PROTONIX) 20 MG tablet Take 1 tablet (20 mg total) by mouth daily. 30 tablet 2   simvastatin (ZOCOR) 5 MG tablet TAKE 1 TABLET BY MOUTH AT BEDTIME 90 tablet 0   vitamin B-12 (CYANOCOBALAMIN) 1000 MCG tablet Take 1,000 mcg by mouth daily.       vitamin C (ASCORBIC ACID) 500 MG tablet Take 500 mg by mouth daily.       No current facility-administered medications on file prior to visit.    Allergies  Allergen Reactions   Haemophilus Influenzae Vaccines Nausea And Vomiting    Social History    Socioeconomic History   Marital status: Widowed    Spouse name: Not on file   Number of children: Not on file   Years of education: Not on file   Highest education level: Not on file  Occupational History   Not on file  Tobacco Use   Smoking status: Never    Passive exposure: Never   Smokeless tobacco: Never  Substance and Sexual Activity   Alcohol use: No   Drug use: No   Sexual activity: Not Currently  Other Topics Concern   Not on file  Social History Narrative   Not on file   Social Determinants of Health   Financial Resource Strain: Not on file  Food Insecurity: Not on file  Transportation Needs: Not on file  Physical Activity: Not on file  Stress: Not on file  Social Connections: Not on file  Intimate Partner Violence: Not on file    Family History  Problem Relation Age of Onset   Heart disease Mother 85   Cancer Paternal Aunt        colon    Past Surgical History:  Procedure Laterality Date   APPENDECTOMY     BREAST SURGERY  04-16-09   left  Lumpectomy   FRACTURE SURGERY     broke right ankle in the 80's   MASTECTOMY  November 2011   right breast   MASTECTOMY W/ SENTINEL NODE BIOPSY  03/22/2011   Procedure: MASTECTOMY WITH SENTINEL LYMPH NODE BIOPSY;  Surgeon: Kandis Cocking, MD;  Location: [facility] OR;  Service: General;  Laterality: Left;  left mastectomy, sential lymph node biopsy, left axilla    ROS: Review of Systems Negative except as stated above  PHYSICAL EXAM: There were no vitals taken for this visit.  Physical Exam  {female adult master:310786} {female adult master:310785}     Latest Ref Rng & Units 09/29/2022   11:55 AM 09/29/2022   11:12 AM 07/28/2022    9:34 AM  CMP  Glucose 70 - 99 mg/dL  409  87   BUN 8 - 23 mg/dL  12  12   Creatinine 8.11 - 1.00 mg/dL  9.14  7.82   Sodium 956 - 145 mmol/L  137  140   Potassium 3.5 - 5.1 mmol/L  3.9  4.2   Chloride 98 - 111 mmol/L  98  101   CO2 22 - 32 mmol/L  26  22   Calcium 8.9 - 10.3  mg/dL  21.3  08.6   Total Protein 6.5 - 8.1 g/dL 7.9   7.2   Total Bilirubin 0.3 - 1.2 mg/dL 0.7   0.4   Alkaline Phos 38 - 126 U/L 104   145   AST 15 - 41 U/L 20   11   ALT 0 - 44 U/L 13   12    Lipid Panel  No results found for: "CHOL", "TRIG", "HDL", "CHOLHDL", "VLDL", "LDLCALC", "LDLDIRECT"  CBC    Component Value Date/Time   WBC 6.3 09/29/2022 1112   RBC 5.21 (H) 09/29/2022 1112   HGB 14.7 09/29/2022 1112   HGB 13.3 07/28/2022 0934   HGB 13.3 02/12/2014 1211   HCT 44.1 09/29/2022 1112   HCT 40.2 07/28/2022 0934   HCT 40.2 02/12/2014 1211   PLT 232 09/29/2022 1112   PLT 268 07/28/2022 0934   MCV 84.6 09/29/2022 1112   MCV 85 07/28/2022 0934   MCV 88.7 02/12/2014 1211   MCH 28.2 09/29/2022 1112   MCHC 33.3 09/29/2022 1112   RDW 14.6 09/29/2022 1112   RDW 13.4 07/28/2022 0934   RDW 13.2 02/12/2014 1211   LYMPHSABS 1.7 07/28/2022 0934   LYMPHSABS 1.9 02/12/2014 1211   MONOABS 0.6 01/02/2020 0350   MONOABS 0.4 02/12/2014 1211   EOSABS 0.1 07/28/2022 0934   BASOSABS 0.0 07/28/2022 0934   BASOSABS 0.0 02/12/2014 1211    ASSESSMENT AND PLAN:  There are no diagnoses linked to this encounter.   Patient was given the opportunity to ask questions.  Patient verbalized understanding of the plan and was able to repeat key elements of the plan. Patient was given clear instructions to go to Emergency Department or return to medical center if symptoms don't improve, worsen, or new problems develop.The patient verbalized understanding.   No  orders of the defined types were placed in this encounter.    Requested Prescriptions    No prescriptions requested or ordered in this encounter    No follow-ups on file.  Camillia Herter, NP

## 2022-10-12 ENCOUNTER — Encounter: Payer: Self-pay | Admitting: Family

## 2022-10-12 ENCOUNTER — Ambulatory Visit (INDEPENDENT_AMBULATORY_CARE_PROVIDER_SITE_OTHER): Payer: Medicare Other | Admitting: Family

## 2022-10-12 VITALS — BP 152/71 | HR 61 | Resp 12 | Ht 59.0 in | Wt 112.0 lb

## 2022-10-12 DIAGNOSIS — K449 Diaphragmatic hernia without obstruction or gangrene: Secondary | ICD-10-CM

## 2022-10-12 DIAGNOSIS — R079 Chest pain, unspecified: Secondary | ICD-10-CM

## 2022-10-12 NOTE — Progress Notes (Signed)
Pt is here for hos f/u   Pt states she is feeling better since leaving the hospital, denies any chest pain at this time

## 2022-10-25 ENCOUNTER — Other Ambulatory Visit: Payer: Self-pay | Admitting: Family Medicine

## 2022-11-04 ENCOUNTER — Other Ambulatory Visit: Payer: Self-pay | Admitting: Family Medicine

## 2022-12-03 ENCOUNTER — Other Ambulatory Visit: Payer: Self-pay | Admitting: Family Medicine

## 2022-12-06 ENCOUNTER — Ambulatory Visit: Payer: Self-pay

## 2022-12-06 NOTE — Telephone Encounter (Signed)
  Chief Complaint: why was simvastatin denied? Asking for refill SDisposition: [] ED /[] Urgent Care (no appt availability in office) / [] Appointment(In office/virtual)/ []  [facility]/ [] Home Care/ [] Refused Recommended Disposition /[] [place] [facility]/ [x]  Follow-up with PCP Additional Notes: pt almost out of med. Lasrt ordered 04/2022. 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 DRUG NAME: "What medicine do you need to have refilled?"     simvastatin 2. REFILLS REMAINING: "How many refills are remaining?" (Note: The label on the medicine or pill bottle will show how many refills are remaining. If there are no refills remaining, then a renewal may be needed.)     No refills  Protocols used: Medication Refill and Renewal Call-A-AH

## 2022-12-09 ENCOUNTER — Other Ambulatory Visit: Payer: Self-pay | Admitting: *Deleted

## 2022-12-09 ENCOUNTER — Other Ambulatory Visit: Payer: Self-pay | Admitting: Family Medicine

## 2022-12-09 MED ORDER — SIMVASTATIN 5 MG PO TABS: 5.0000 mg | ORAL_TABLET | Freq: Every day | ORAL | 0 refills | Status: DC

## 2022-12-09 MED ORDER — CARVEDILOL 12.5 MG PO TABS: 12.5000 mg | ORAL_TABLET | Freq: Two times a day (BID) | ORAL | 0 refills | Status: DC

## 2022-12-23 ENCOUNTER — Ambulatory Visit (INDEPENDENT_AMBULATORY_CARE_PROVIDER_SITE_OTHER): Payer: Medicare Other | Admitting: Family Medicine

## 2022-12-23 ENCOUNTER — Encounter: Payer: Self-pay | Admitting: Family Medicine

## 2022-12-23 VITALS — BP 174/80 | HR 64 | Temp 98.1°F | Resp 12 | Ht <= 58 in | Wt 110.6 lb

## 2022-12-23 DIAGNOSIS — E039 Hypothyroidism, unspecified: Secondary | ICD-10-CM | POA: Diagnosis not present

## 2022-12-23 DIAGNOSIS — R634 Abnormal weight loss: Secondary | ICD-10-CM

## 2022-12-23 DIAGNOSIS — K449 Diaphragmatic hernia without obstruction or gangrene: Secondary | ICD-10-CM

## 2022-12-23 DIAGNOSIS — E785 Hyperlipidemia, unspecified: Secondary | ICD-10-CM | POA: Diagnosis not present

## 2022-12-23 DIAGNOSIS — I10 Essential (primary) hypertension: Secondary | ICD-10-CM | POA: Diagnosis not present

## 2022-12-23 MED ORDER — PANTOPRAZOLE SODIUM 20 MG PO TBEC: 20.0000 mg | DELAYED_RELEASE_TABLET | Freq: Every day | ORAL | 2 refills | Status: DC

## 2022-12-23 NOTE — Progress Notes (Signed)
3 month follow up and medication refill

## 2022-12-23 NOTE — Progress Notes (Signed)
Established Patient Office Visit  Subjective    Patient ID: Claudia Stein, female    DOB: December 06, 1933  Age: 89 y.o. MRN: [id number]  CC:  Chief Complaint  Patient presents with   Follow-up    3 month follow up medication refill    HPI Claudia Stein presents for follow up of chronic med issues. Patient reports that she continues to lose weight in spite of eating and supplementing her meals.    Outpatient Encounter Medications as of 12/23/2022  Medication Sig   amLODipine (NORVASC) 10 MG tablet Take 1 tablet by mouth once daily   Calcium Carbonate-Vitamin D (CALCIUM PLUS VITAMIN D PO) Take 1 tablet by mouth daily.     carvedilol (COREG) 12.5 MG tablet Take 1 tablet (12.5 mg total) by mouth 2 (two) times daily with a meal.   cholecalciferol (VITAMIN D3) 25 MCG (1000 UNIT) tablet Take 1,000 Units by mouth daily.   latanoprost (XALATAN) 0.005 % ophthalmic solution Place 1 drop into both eyes at bedtime.    levothyroxine (SYNTHROID) 75 MCG tablet Take 1 tablet by mouth once daily   Multiple Vitamins-Minerals (PRESERVISION AREDS 2 PO) Take 1 tablet by mouth in the morning and at bedtime.    pantoprazole (PROTONIX) 20 MG tablet Take 1 tablet (20 mg total) by mouth daily.   simvastatin (ZOCOR) 5 MG tablet Take 1 tablet (5 mg total) by mouth at bedtime.   vitamin B-12 (CYANOCOBALAMIN) 1000 MCG tablet Take 1,000 mcg by mouth daily.     vitamin C (ASCORBIC ACID) 500 MG tablet Take 500 mg by mouth daily.     [DISCONTINUED] pantoprazole (PROTONIX) 20 MG tablet Take 1 tablet (20 mg total) by mouth daily.   No facility-administered encounter medications on file as of 12/23/2022.    Past Medical History:  Diagnosis Date   Bruises easily    Cancer (HCC) 04-16-09, 2012   Breast- bilateral   Hyperlipidemia    Hypertension    PONV (postoperative nausea and vomiting)    Thyroid disease     Past Surgical History:  Procedure Laterality Date   APPENDECTOMY     BREAST SURGERY  04-16-09   left   Lumpectomy   FRACTURE SURGERY     broke right ankle in the 80's   MASTECTOMY  November 2011   right breast   MASTECTOMY W/ SENTINEL NODE BIOPSY  03/22/2011   Procedure: MASTECTOMY WITH SENTINEL LYMPH NODE BIOPSY;  Surgeon: Kandis Cocking, MD;  Location: [facility] OR;  Service: General;  Laterality: Left;  left mastectomy, sential lymph node biopsy, left axilla    Family History  Problem Relation Age of Onset   Heart disease Mother 25   Cancer Paternal Aunt        colon    Social History   Socioeconomic History   Marital status: Widowed    Spouse name: Not on file   Number of children: Not on file   Years of education: Not on file   Highest education level: Not on file  Occupational History   Not on file  Tobacco Use   Smoking status: Never    Passive exposure: Never   Smokeless tobacco: Never  Substance and Sexual Activity   Alcohol use: No   Drug use: No   Sexual activity: Not Currently  Other Topics Concern   Not on file  Social History Narrative   Not on file   Social Determinants of Health   Financial Resource Strain: Not on  file  Food Insecurity: Not on file  Transportation Needs: Not on file  Physical Activity: Not on file  Stress: Not on file  Social Connections: Not on file  Intimate Partner Violence: Not on file    Review of Systems  Constitutional:  Positive for weight loss.  All other systems reviewed and are negative.       Objective    BP (!) 174/80 (BP Location: Right Arm, Patient Position: Sitting, Cuff Size: Normal)   Pulse 64   Temp 98.1 F (36.7 C) (Oral)   Resp 12   Ht 4\' 9"  (1.448 m)   Wt 110 lb 9.6 oz (50.2 kg)   SpO2 96%   BMI 23.93 kg/m   Physical Exam Vitals and nursing note reviewed.  Constitutional:      General: She is not in acute distress. HENT:     Head: Normocephalic.  Cardiovascular:     Rate and Rhythm: Normal rate and regular rhythm.  Pulmonary:     Effort: Pulmonary effort is normal.     Breath sounds: Normal  breath sounds.  Abdominal:     Palpations: Abdomen is soft.     Tenderness: There is no abdominal tenderness.  Neurological:     General: No focal deficit present.     Mental Status: She is alert and oriented to person, place, and time.         Assessment & Plan:   1. Essential hypertension Slightly elevated readings. Continue and monitor  2. Hypothyroidism, unspecified type Continue   3. Hyperlipidemia, unspecified hyperlipidemia type Continue   4. Hiatal hernia Continue pantoprazole  5. Loss of weight Discussed dietary options. Continue Ensure supplements daily.     Return in about 3 months (around 03/25/2023) for follow up.   Tommie Raymond, MD

## 2023-01-24 ENCOUNTER — Other Ambulatory Visit: Payer: Self-pay | Admitting: Family Medicine

## 2023-01-24 ENCOUNTER — Other Ambulatory Visit: Payer: Self-pay

## 2023-01-24 DIAGNOSIS — I10 Essential (primary) hypertension: Secondary | ICD-10-CM

## 2023-01-24 MED ORDER — LEVOTHYROXINE SODIUM 75 MCG PO TABS
75.0000 ug | ORAL_TABLET | Freq: Every day | ORAL | 0 refills | Status: AC
Start: 2023-01-24 — End: ?

## 2023-01-29 ENCOUNTER — Other Ambulatory Visit: Payer: Self-pay | Admitting: Family Medicine

## 2023-01-31 ENCOUNTER — Emergency Department (HOSPITAL_COMMUNITY): Payer: Medicare Other

## 2023-01-31 ENCOUNTER — Emergency Department (HOSPITAL_COMMUNITY): Payer: Medicare Other | Admitting: Certified Registered Nurse Anesthetist

## 2023-01-31 ENCOUNTER — Encounter (HOSPITAL_COMMUNITY): Payer: Self-pay

## 2023-01-31 ENCOUNTER — Inpatient Hospital Stay (HOSPITAL_COMMUNITY)
Admission: EM | Admit: 2023-01-31 | Discharge: 2023-02-02 | DRG: 393 | Disposition: A | Discharge status: Home / Self Care | Payer: Medicare Other | Attending: Internal Medicine | Admitting: Internal Medicine

## 2023-01-31 ENCOUNTER — Encounter (HOSPITAL_COMMUNITY)
Admission: EM | Disposition: A | Discharge status: Home / Self Care | Payer: Self-pay | Source: Home / Self Care | Attending: Internal Medicine

## 2023-01-31 ENCOUNTER — Inpatient Hospital Stay (HOSPITAL_COMMUNITY): Payer: Medicare Other

## 2023-01-31 ENCOUNTER — Other Ambulatory Visit: Payer: Self-pay

## 2023-01-31 DIAGNOSIS — K222 Esophageal obstruction: Secondary | ICD-10-CM | POA: Diagnosis not present

## 2023-01-31 DIAGNOSIS — K449 Diaphragmatic hernia without obstruction or gangrene: Secondary | ICD-10-CM | POA: Diagnosis not present

## 2023-01-31 DIAGNOSIS — I10 Essential (primary) hypertension: Secondary | ICD-10-CM | POA: Diagnosis not present

## 2023-01-31 DIAGNOSIS — I517 Cardiomegaly: Secondary | ICD-10-CM | POA: Diagnosis not present

## 2023-01-31 DIAGNOSIS — E039 Hypothyroidism, unspecified: Secondary | ICD-10-CM | POA: Diagnosis present

## 2023-01-31 DIAGNOSIS — K209 Esophagitis, unspecified without bleeding: Secondary | ICD-10-CM | POA: Diagnosis not present

## 2023-01-31 DIAGNOSIS — Z853 Personal history of malignant neoplasm of breast: Secondary | ICD-10-CM | POA: Diagnosis not present

## 2023-01-31 DIAGNOSIS — K219 Gastro-esophageal reflux disease without esophagitis: Secondary | ICD-10-CM | POA: Diagnosis present

## 2023-01-31 DIAGNOSIS — W44F3XA Food entering into or through a natural orifice, initial encounter: Secondary | ICD-10-CM | POA: Diagnosis not present

## 2023-01-31 DIAGNOSIS — T18128A Food in esophagus causing other injury, initial encounter: Principal | ICD-10-CM | POA: Diagnosis present

## 2023-01-31 DIAGNOSIS — Y929 Unspecified place or not applicable: Secondary | ICD-10-CM

## 2023-01-31 DIAGNOSIS — R0902 Hypoxemia: Secondary | ICD-10-CM

## 2023-01-31 DIAGNOSIS — K5710 Diverticulosis of small intestine without perforation or abscess without bleeding: Secondary | ICD-10-CM | POA: Diagnosis not present

## 2023-01-31 DIAGNOSIS — Z887 Allergy status to serum and vaccine status: Secondary | ICD-10-CM

## 2023-01-31 DIAGNOSIS — Z9012 Acquired absence of left breast and nipple: Secondary | ICD-10-CM

## 2023-01-31 DIAGNOSIS — Z79899 Other long term (current) drug therapy: Secondary | ICD-10-CM

## 2023-01-31 DIAGNOSIS — T17908D Unspecified foreign body in respiratory tract, part unspecified causing other injury, subsequent encounter: Secondary | ICD-10-CM | POA: Diagnosis not present

## 2023-01-31 DIAGNOSIS — Z9011 Acquired absence of right breast and nipple: Secondary | ICD-10-CM | POA: Diagnosis not present

## 2023-01-31 DIAGNOSIS — E785 Hyperlipidemia, unspecified: Secondary | ICD-10-CM | POA: Diagnosis present

## 2023-01-31 DIAGNOSIS — Z79890 Hormone replacement therapy (postmenopausal): Secondary | ICD-10-CM

## 2023-01-31 DIAGNOSIS — J690 Pneumonitis due to inhalation of food and vomit: Secondary | ICD-10-CM | POA: Diagnosis present

## 2023-01-31 DIAGNOSIS — J9601 Acute respiratory failure with hypoxia: Secondary | ICD-10-CM | POA: Diagnosis present

## 2023-01-31 DIAGNOSIS — Z9049 Acquired absence of other specified parts of digestive tract: Secondary | ICD-10-CM | POA: Diagnosis not present

## 2023-01-31 DIAGNOSIS — T17908A Unspecified foreign body in respiratory tract, part unspecified causing other injury, initial encounter: Secondary | ICD-10-CM | POA: Diagnosis present

## 2023-01-31 DIAGNOSIS — J479 Bronchiectasis, uncomplicated: Secondary | ICD-10-CM | POA: Diagnosis not present

## 2023-01-31 DIAGNOSIS — B3781 Candidal esophagitis: Secondary | ICD-10-CM | POA: Diagnosis not present

## 2023-01-31 DIAGNOSIS — Z8249 Family history of ischemic heart disease and other diseases of the circulatory system: Secondary | ICD-10-CM | POA: Diagnosis not present

## 2023-01-31 DIAGNOSIS — Z923 Personal history of irradiation: Secondary | ICD-10-CM | POA: Diagnosis not present

## 2023-01-31 DIAGNOSIS — R1310 Dysphagia, unspecified: Secondary | ICD-10-CM | POA: Diagnosis not present

## 2023-01-31 DIAGNOSIS — R918 Other nonspecific abnormal finding of lung field: Secondary | ICD-10-CM | POA: Diagnosis not present

## 2023-01-31 HISTORY — PX: FOREIGN BODY REMOVAL: SHX962

## 2023-01-31 HISTORY — PX: ESOPHAGOGASTRODUODENOSCOPY (EGD) WITH PROPOFOL: SHX5813

## 2023-01-31 LAB — CBC WITH DIFFERENTIAL/PLATELET
Abs Immature Granulocytes: 0.09 10*3/uL — ABNORMAL HIGH (ref 0.00–0.07)
Basophils Absolute: 0 10*3/uL (ref 0.0–0.1)
Basophils Relative: 0 %
Eosinophils Absolute: 0 10*3/uL (ref 0.0–0.5)
Eosinophils Relative: 0 %
HCT: 36.6 % (ref 36.0–46.0)
Hemoglobin: 12.2 g/dL (ref 12.0–15.0)
Immature Granulocytes: 1 %
Lymphocytes Relative: 6 %
Lymphs Abs: 0.9 10*3/uL (ref 0.7–4.0)
MCH: 28.9 pg (ref 26.0–34.0)
MCHC: 33.3 g/dL (ref 30.0–36.0)
MCV: 86.7 fL (ref 80.0–100.0)
Monocytes Absolute: 0.4 10*3/uL (ref 0.1–1.0)
Monocytes Relative: 3 %
Neutro Abs: 13.7 10*3/uL — ABNORMAL HIGH (ref 1.7–7.7)
Neutrophils Relative %: 90 %
Platelets: 194 10*3/uL (ref 150–400)
RBC: 4.22 MIL/uL (ref 3.87–5.11)
RDW: 14.6 % (ref 11.5–15.5)
WBC: 15.2 10*3/uL — ABNORMAL HIGH (ref 4.0–10.5)
nRBC: 0 % (ref 0.0–0.2)

## 2023-01-31 LAB — CBC
HCT: 40.6 % (ref 36.0–46.0)
Hemoglobin: 13.7 g/dL (ref 12.0–15.0)
MCH: 29.7 pg (ref 26.0–34.0)
MCHC: 33.7 g/dL (ref 30.0–36.0)
MCV: 88.1 fL (ref 80.0–100.0)
Platelets: 216 10*3/uL (ref 150–400)
RBC: 4.61 MIL/uL (ref 3.87–5.11)
RDW: 14.4 % (ref 11.5–15.5)
WBC: 15.2 10*3/uL — ABNORMAL HIGH (ref 4.0–10.5)
nRBC: 0 % (ref 0.0–0.2)

## 2023-01-31 LAB — CREATININE, SERUM
Creatinine, Ser: 0.84 mg/dL (ref 0.44–1.00)
GFR, Estimated: >60 mL/min (ref >60)

## 2023-01-31 LAB — COMPREHENSIVE METABOLIC PANEL
ALT: 13 U/L (ref 0–44)
AST: 23 U/L (ref 15–41)
Albumin: 2.7 g/dL — ABNORMAL LOW (ref 3.5–5.0)
Alkaline Phosphatase: 90 U/L (ref 38–126)
Anion gap: 16 — ABNORMAL HIGH (ref 5–15)
BUN: 15 mg/dL (ref 8–23)
CO2: 22 mmol/L (ref 22–32)
Calcium: 9.2 mg/dL (ref 8.9–10.3)
Chloride: 100 mmol/L (ref 98–111)
Creatinine, Ser: 1.08 mg/dL — ABNORMAL HIGH (ref 0.44–1.00)
GFR, Estimated: 50 mL/min — ABNORMAL LOW (ref >60)
Glucose, Bld: 192 mg/dL — ABNORMAL HIGH (ref 70–99)
Potassium: 3.4 mmol/L — ABNORMAL LOW (ref 3.5–5.1)
Sodium: 138 mmol/L (ref 135–145)
Total Bilirubin: 0.9 mg/dL (ref 0.3–1.2)
Total Protein: 6.3 g/dL — ABNORMAL LOW (ref 6.5–8.1)

## 2023-01-31 LAB — TSH
TSH: 1.498 u[IU]/mL (ref 0.350–4.500)
TSH: 1.084 u[IU]/mL (ref 0.350–4.500)

## 2023-01-31 LAB — BRAIN NATRIURETIC PEPTIDE: B Natriuretic Peptide: 432.5 pg/mL — ABNORMAL HIGH (ref 0.0–100.0)

## 2023-01-31 LAB — HEMOGLOBIN A1C
Hgb A1c MFr Bld: 5.3 % (ref 4.8–5.6)
Hgb A1c MFr Bld: 5.3 % (ref 4.8–5.6)
Mean Plasma Glucose: 105.41 mg/dL
Mean Plasma Glucose: 105.41 mg/dL

## 2023-01-31 SURGERY — ESOPHAGOGASTRODUODENOSCOPY (EGD) WITH PROPOFOL
Anesthesia: General

## 2023-01-31 MED ORDER — DEXAMETHASONE SODIUM PHOSPHATE 10 MG/ML IJ SOLN
INTRAMUSCULAR | Status: DC | PRN
  Administered 2023-01-31: 5 mg via INTRAVENOUS

## 2023-01-31 MED ORDER — PANTOPRAZOLE SODIUM 40 MG PO TBEC
40.0000 mg | DELAYED_RELEASE_TABLET | Freq: Two times a day (BID) | ORAL | Status: DC
  Administered 2023-02-01 – 2023-02-02 (×3): 40 mg via ORAL
  Filled 2023-01-31 (×3): qty 1

## 2023-01-31 MED ORDER — ONDANSETRON HCL 4 MG/2ML IJ SOLN
INTRAMUSCULAR | Status: DC | PRN
  Administered 2023-01-31: 4 mg via INTRAVENOUS

## 2023-01-31 MED ORDER — SODIUM CHLORIDE 0.9 % IV SOLN: INTRAVENOUS | Status: DC

## 2023-01-31 MED ORDER — ACETAMINOPHEN 650 MG RE SUPP: 650.0000 mg | Freq: Four times a day (QID) | RECTAL | Status: DC | PRN

## 2023-01-31 MED ORDER — LIDOCAINE 2% (20 MG/ML) 5 ML SYRINGE
INTRAMUSCULAR | Status: DC | PRN
  Administered 2023-01-31: 50 mg via INTRAVENOUS

## 2023-01-31 MED ORDER — EPHEDRINE SULFATE-NACL 50-0.9 MG/10ML-% IV SOSY
PREFILLED_SYRINGE | INTRAVENOUS | Status: DC | PRN
  Administered 2023-01-31: 5 mg via INTRAVENOUS

## 2023-01-31 MED ORDER — FENTANYL CITRATE (PF) 100 MCG/2ML IJ SOLN: 25.0000 ug | INTRAMUSCULAR | Status: DC | PRN

## 2023-01-31 MED ORDER — ONDANSETRON HCL 4 MG/2ML IJ SOLN: 4.0000 mg | Freq: Four times a day (QID) | INTRAMUSCULAR | Status: DC | PRN

## 2023-01-31 MED ORDER — CARVEDILOL 12.5 MG PO TABS
12.5000 mg | ORAL_TABLET | Freq: Two times a day (BID) | ORAL | Status: DC
  Administered 2023-02-01 – 2023-02-02 (×3): 12.5 mg via ORAL
  Filled 2023-01-31 (×3): qty 1

## 2023-01-31 MED ORDER — ONDANSETRON HCL 4 MG/2ML IJ SOLN
4.0000 mg | Freq: Once | INTRAMUSCULAR | Status: AC
  Administered 2023-01-31: 4 mg via INTRAVENOUS
  Filled 2023-01-31: qty 2

## 2023-01-31 MED ORDER — PROPOFOL 10 MG/ML IV BOLUS
INTRAVENOUS | Status: DC | PRN
  Administered 2023-01-31: 90 mg via INTRAVENOUS

## 2023-01-31 MED ORDER — GLUCAGON HCL RDNA (DIAGNOSTIC) 1 MG IJ SOLR
1.0000 mg | Freq: Once | INTRAMUSCULAR | Status: AC
  Administered 2023-01-31: 1 mg via INTRAVENOUS
  Filled 2023-01-31: qty 1

## 2023-01-31 MED ORDER — ACETAMINOPHEN 325 MG PO TABS: 650.0000 mg | ORAL_TABLET | Freq: Four times a day (QID) | ORAL | Status: DC | PRN

## 2023-01-31 MED ORDER — ONDANSETRON HCL 4 MG PO TABS: 4.0000 mg | ORAL_TABLET | Freq: Four times a day (QID) | ORAL | Status: DC | PRN

## 2023-01-31 MED ORDER — LEVOTHYROXINE SODIUM 75 MCG PO TABS
75.0000 ug | ORAL_TABLET | Freq: Every day | ORAL | Status: DC
  Administered 2023-02-01 – 2023-02-02 (×2): 75 ug via ORAL
  Filled 2023-01-31 (×2): qty 1

## 2023-01-31 MED ORDER — PANTOPRAZOLE SODIUM 40 MG IV SOLR
40.0000 mg | Freq: Once | INTRAVENOUS | Status: AC
  Administered 2023-01-31: 40 mg via INTRAVENOUS
  Filled 2023-01-31 (×2): qty 10

## 2023-01-31 MED ORDER — LACTATED RINGERS IV SOLN
INTRAVENOUS | Status: AC | PRN
Start: 2023-01-31 — End: 2023-01-31
  Administered 2023-01-31: 1000 mL via INTRAVENOUS

## 2023-01-31 MED ORDER — PHENYLEPHRINE 80 MCG/ML (10ML) SYRINGE FOR IV PUSH (FOR BLOOD PRESSURE SUPPORT)
PREFILLED_SYRINGE | INTRAVENOUS | Status: DC | PRN
  Administered 2023-01-31 (×2): 160 ug via INTRAVENOUS
  Administered 2023-01-31: 80 ug via INTRAVENOUS

## 2023-01-31 MED ORDER — SIMVASTATIN 5 MG PO TABS
5.0000 mg | ORAL_TABLET | Freq: Every day | ORAL | Status: DC
  Administered 2023-01-31 – 2023-02-01 (×2): 5 mg via ORAL
  Filled 2023-01-31 (×3): qty 1

## 2023-01-31 MED ORDER — FLUCONAZOLE 100 MG PO TABS
200.0000 mg | ORAL_TABLET | Freq: Every day | ORAL | Status: DC
  Administered 2023-02-01: 200 mg via ORAL
  Filled 2023-01-31: qty 2

## 2023-01-31 MED ORDER — HEPARIN SODIUM (PORCINE) 5000 UNIT/ML IJ SOLN
5000.0000 [IU] | Freq: Three times a day (TID) | INTRAMUSCULAR | Status: DC
  Administered 2023-01-31 – 2023-02-02 (×5): 5000 [IU] via SUBCUTANEOUS
  Filled 2023-01-31 (×5): qty 1

## 2023-01-31 MED ORDER — IPRATROPIUM-ALBUTEROL 0.5-2.5 (3) MG/3ML IN SOLN: 3.0000 mL | Freq: Three times a day (TID) | RESPIRATORY_TRACT | Status: DC

## 2023-01-31 MED ORDER — HEPARIN SODIUM (PORCINE) 5000 UNIT/ML IJ SOLN: 5000.0000 [IU] | Freq: Three times a day (TID) | INTRAMUSCULAR | Status: DC

## 2023-01-31 MED ORDER — AMLODIPINE BESYLATE 10 MG PO TABS
10.0000 mg | ORAL_TABLET | Freq: Every day | ORAL | Status: DC
  Administered 2023-01-31 – 2023-02-02 (×3): 10 mg via ORAL
  Filled 2023-01-31 (×3): qty 1

## 2023-01-31 MED ORDER — ALBUTEROL SULFATE (2.5 MG/3ML) 0.083% IN NEBU: 2.5000 mg | INHALATION_SOLUTION | RESPIRATORY_TRACT | Status: DC | PRN

## 2023-01-31 MED ORDER — SUCCINYLCHOLINE CHLORIDE 200 MG/10ML IV SOSY
PREFILLED_SYRINGE | INTRAVENOUS | Status: DC | PRN
  Administered 2023-01-31: 80 mg via INTRAVENOUS

## 2023-01-31 MED ORDER — FLUCONAZOLE IN SODIUM CHLORIDE 400-0.9 MG/200ML-% IV SOLN
400.0000 mg | Freq: Once | INTRAVENOUS | Status: AC
  Administered 2023-01-31: 400 mg via INTRAVENOUS
  Filled 2023-01-31 (×2): qty 200

## 2023-01-31 MED ORDER — SODIUM CHLORIDE 0.9 % IV SOLN
1.0000 g | INTRAVENOUS | Status: DC
  Administered 2023-01-31: 1 g via INTRAVENOUS
  Filled 2023-01-31: qty 10

## 2023-01-31 SURGICAL SUPPLY — 15 items

## 2023-01-31 NOTE — Op Note (Signed)
[facility] Patient Name: Claudia Stein Procedure Date : 01/31/2023 MRN: [id number] Attending MD: Beverley Fiedler , MD, [id number] Date of Birth: 1934/03/23 CSN: [id number] Age: 89 Admit Type: Outpatient Procedure:                Upper GI endoscopy Indications:              Foreign body in the esophagus, acute dysphagia Providers:                Carie Caddy. Rhea Belton, MD, Stephens Shire RN, RN, Harrington Challenger, Technician Referring MD:             Melene Plan, MD, Emergency Room Medicines:                General Anesthesia Complications:            No immediate complications. Estimated Blood Loss:     Estimated blood loss: none. Procedure:                Pre-Anesthesia Assessment:                           - Prior to the procedure, a History and Physical                            was performed, and patient medications and                            allergies were reviewed. The patient's tolerance of                            previous anesthesia was also reviewed. The risks                            and benefits of the procedure and the sedation                            options and risks were discussed with the patient.                            All questions were answered, and informed consent                            was obtained. Prior Anticoagulants: The patient has                            taken no anticoagulant or antiplatelet agents. ASA                            Grade Assessment: II - A patient with mild systemic                            disease. After reviewing the risks and benefits,  the patient was deemed in satisfactory condition to                            undergo the procedure.                           After obtaining informed consent, the endoscope was                            passed under direct vision. Throughout the                            procedure, the patient's blood pressure, pulse, and                             oxygen saturations were monitored continuously. The                            [id number]-[id number] ([id number]) Olympus endoscope was introduced                            through the mouth, and advanced to the second part                            of duodenum. The upper GI endoscopy was                            accomplished without difficulty. The patient                            tolerated the procedure well. Scope In: Scope Out: Findings:      Diffuse, white plaques were found in the entire esophagus.      Food was found in the middle third of the esophagus and in the lower       third of the esophagus. Removal was accomplished with a Raptor grasping       device and Roth net.      LA Grade D (one or more mucosal breaks involving at least 75% of       esophageal circumference) esophagitis was found in the lower third of       the esophagus.      One benign-appearing, intrinsic moderate (circumferential scarring or       stenosis; an endoscope may pass) stenosis was found 35 cm from the       incisors. This stenosis measured 1 cm (inner diameter) x 1 cm (in       length). The stenosis was traversed.      A 5 cm hiatal hernia was present.      The entire examined stomach was normal.      A large non-bleeding diverticulum was found in the second portion of the       duodenum.      The exam of the duodenum was otherwise normal. Impression:               - Esophageal plaques were found, consistent with  candidiasis.                           - Food in the middle third of the esophagus and in                            the lower third of the esophagus. Removal was                            successful.                           - LA Grade D esophagitis in distal esophagus/GE                            junction in setting of acute food impaction.                           - Benign-appearing esophageal stenosis.                           - 5 cm hiatal  hernia.                           - Normal stomach.                           - Non-bleeding duodenal diverticulum. Moderate Sedation:      N/A Recommendation:           - Return patient to hospital ward for observation.                           - Advance diet as tolerated to max of soft.                           - Continue present medications.                           - Fluconazole 400 mg x 1 day, 200 mg x 13 days for                            Candida esophagitis.                           - BID PPI until next EGD.                           - Repeat upper endoscopy in 6 weeks to check                            healing. I will arrange.                           - [facility] to evaluate for  observation admission given pre-procedure hypoxia                            and concern for pre-procedure aspiration. Procedure Code(s):        --- Professional ---                           [phone], Esophagogastroduodenoscopy, flexible,                            transoral; with removal of foreign body(s) Diagnosis Code(s):        --- Professional ---                           K22.9, Disease of esophagus, unspecified                           T18.128A, Food in esophagus causing other injury,                            initial encounter                           K20.90, Esophagitis, unspecified without bleeding                           K22.2, Esophageal obstruction                           K44.9, Diaphragmatic hernia without obstruction or                            gangrene                           T18.108A, Unspecified foreign body in esophagus                            causing other injury, initial encounter                           K57.10, Diverticulosis of small intestine without                            perforation or abscess without bleeding CPT copyright 2022 [employer]. All rights reserved. The codes documented in this  report are preliminary and upon coder review may  be revised to meet current compliance requirements. Beverley Fiedler, MD 01/31/2023 3:48:37 PM This report has been signed electronically. Number of Addenda: 0

## 2023-01-31 NOTE — ED Provider Notes (Signed)
[facility] AT [facility] Provider Note   CSN: [id number] Arrival date & time: 01/31/23  4098     History  Chief Complaint  Patient presents with   Dysphagia    Claudia Stein is a 89 y.o. female.  89 yo F with a chief complaint of not being able to swallow.  Patient ate steak last night and felt like a piece got stuck.  Since then she has not been able to tolerate liquids or solids.  She thought maybe she can drink water but it came right back up afterwards.  Denies any difficulty with breathing.  Has had this happen to her before but usually resolves on its own.        Home Medications Prior to Admission medications   Medication Sig Start Date End Date Taking? Authorizing Provider  amLODipine (NORVASC) 10 MG tablet Take 1 tablet by mouth once daily 01/31/23  Yes Georganna Skeans, MD  Calcium Carbonate-Vitamin D (CALCIUM PLUS VITAMIN D PO) Take 1 tablet by mouth daily.     Yes [provider]  carvedilol (COREG) 12.5 MG tablet Take 1 tablet (12.5 mg total) by mouth 2 (two) times daily with a meal. 12/09/22  Yes Georganna Skeans, MD  cholecalciferol (VITAMIN D3) 25 MCG (1000 UNIT) tablet Take 1,000 Units by mouth daily.   Yes [provider]  latanoprost (XALATAN) 0.005 % ophthalmic solution Place 1 drop into both eyes at bedtime.  12/10/19  Yes [provider]  levothyroxine (SYNTHROID) 75 MCG tablet Take 1 tablet by mouth once daily 01/24/23  Yes Georganna Skeans, MD  levothyroxine (SYNTHROID) 75 MCG tablet Take 1 tablet (75 mcg total) by mouth daily. 01/24/23  Yes Georganna Skeans, MD  Multiple Vitamins-Minerals (PRESERVISION AREDS 2 PO) Take 1 tablet by mouth in the morning and at bedtime.    Yes [provider]  pantoprazole (PROTONIX) 20 MG tablet Take 1 tablet (20 mg total) by mouth daily. 12/23/22 03/23/23 Yes Georganna Skeans, MD  simvastatin (ZOCOR) 5 MG tablet Take 1 tablet (5 mg total) by mouth at bedtime. 12/09/22  Yes  Georganna Skeans, MD  vitamin B-12 (CYANOCOBALAMIN) 1000 MCG tablet Take 1,000 mcg by mouth daily.     Yes [provider]  vitamin C (ASCORBIC ACID) 500 MG tablet Take 500 mg by mouth daily.     Yes [provider]      Allergies    Haemophilus influenzae vaccines    Review of Systems   Review of Systems  Physical Exam Updated Vital Signs BP (!) 164/67   Pulse 81   Temp 99 F (37.2 C) (Temporal)   Resp (!) 25   Ht 4\' 9"  (1.448 m)   Wt 50.2 kg   SpO2 92%   BMI 23.95 kg/m  Physical Exam Vitals and nursing note reviewed.  Constitutional:      General: She is not in acute distress.    Appearance: She is well-developed. She is not diaphoretic.  HENT:     Head: Normocephalic and atraumatic.  Eyes:     Pupils: Pupils are equal, round, and reactive to light.  Cardiovascular:     Rate and Rhythm: Normal rate and regular rhythm.     Heart sounds: No murmur heard.    No friction rub. No gallop.  Pulmonary:     Effort: Pulmonary effort is normal.     Breath sounds: No wheezing or rales.  Abdominal:     General: There is no  distension.     Palpations: Abdomen is soft.     Tenderness: There is no abdominal tenderness.  Musculoskeletal:        General: No tenderness.     Cervical back: Normal range of motion and neck supple.  Skin:    General: Skin is warm and dry.  Neurological:     Mental Status: She is alert and oriented to person, place, and time.  Psychiatric:        Behavior: Behavior normal.     ED Results / Procedures / Treatments   Labs (all labs ordered are listed, but only abnormal results are displayed) Labs Reviewed - No data to display  EKG None  Radiology No results found.  Procedures Procedures    Medications Ordered in ED Medications  lactated ringers infusion (1,000 mLs Intravenous New Bag/Given 01/31/23 1424)  glucagon (human recombinant) (GLUCAGEN) injection 1 mg (1 mg Intravenous Given 01/31/23 1342)  ondansetron (ZOFRAN)  injection 4 mg (4 mg Intravenous Given 01/31/23 1343)    ED Course/ Medical Decision Making/ A&P                                 Medical Decision Making Risk Prescription drug management.   89 yo F with a cc of an esophageal food impaction.  She has had issues with this but never persistent.  She has not been to swallow since last night.  Trial of glucagon.  Reassess.  Patient unable to swallow after glucagon.  Not tolerating oral trial.  Discussed this with GI.  Taken to endoscopy suite.  The patients results and plan were reviewed and discussed.   Any x-rays performed were independently reviewed by myself.   Differential diagnosis were considered with the presenting HPI.  Medications  lactated ringers infusion (1,000 mLs Intravenous New Bag/Given 01/31/23 1424)  glucagon (human recombinant) (GLUCAGEN) injection 1 mg (1 mg Intravenous Given 01/31/23 1342)  ondansetron (ZOFRAN) injection 4 mg (4 mg Intravenous Given 01/31/23 1343)    Vitals:   01/31/23 1007 01/31/23 1024 01/31/23 1419  BP: (!) 162/68  (!) 164/67  Pulse: 77  81  Resp: 18  (!) 25  Temp: 99.9 F (37.7 C)  99 F (37.2 C)  TempSrc:   Temporal  SpO2: 91%  92%  Weight:  50.2 kg   Height:  4\' 9"  (1.448 m)     Final diagnoses:  Esophageal obstruction due to food impaction           Final Clinical Impression(s) / ED Diagnoses Final diagnoses:  Esophageal obstruction due to food impaction    Rx / DC Orders ED Discharge Orders     None         Melene Plan, DO 01/31/23 1508

## 2023-01-31 NOTE — Anesthesia Procedure Notes (Signed)
Procedure Name: Intubation Date/Time: 01/31/2023 2:46 PM  Performed by: Kaylyn Layer, MDPre-anesthesia Checklist: Patient identified, Emergency Drugs available, Suction available and Patient being monitored Patient Re-evaluated:Patient Re-evaluated prior to induction Oxygen Delivery Method: Circle System Utilized Preoxygenation: Pre-oxygenation with 100% oxygen Induction Type: IV induction Ventilation: Mask ventilation without difficulty Laryngoscope Size: Miller and 2 Grade View: Grade I Tube type: Oral Tube size: 7.0 mm Number of attempts: 1 Airway Equipment and Method: Stylet Placement Confirmation: ETT inserted through vocal cords under direct vision, positive ETCO2 and breath sounds checked- equal and bilateral Secured at: 21 cm Tube secured with: Tape Dental Injury: Teeth and Oropharynx as per pre-operative assessment

## 2023-01-31 NOTE — H&P (Signed)
History and Physical    Claudia Stein MWU:[id number] DOB: April 11, 1934 DOA: 01/31/2023  PCP: Georganna Skeans, MD  Patient coming from: home  I have personally briefly reviewed patient's old medical records in [facility]  Chief Complaint:  food impaction  HPI: Claudia Stein is a 89 y.o. female with medical history significant of  BRCA in remission, HTN, HLD hypothyroidism , who presents to ED after eating meal with steak leading to  impacted food bolus. Patient notes she can swallow her saliva but unable to swallow pills or much else. Due to this patient was brought in to ED by family.    ED Course:  Temp 99.9, bp 162/68, hr 77, rr 18-25 sat 91% on 3L Patient in ed with desat and concern for airway compromise and was then intubated and taken to endoscopy for further treatment.  Patient was found to have esophageal obstruction lower third of the esophagus with successful removal.  Also noted was  Esophageal candidiasis for which she was treated with iv fluconazole x 1 and  will be transitioned to oral  in the am.  Patient was also noted to have Benign-appearing esophageal stenosis.  And 5 cm hiatal hernia. As well as non bleeding duodenal diverticulum.   Of note s/p procedure patient was extubated and able to be weaned of O2.  Patient s/p procedure notes she feels much improved and denies any sob.  Tx LR ,fluconazole,ppi Review of Systems: As per HPI otherwise 10 point review of systems negative.   Past Medical History:  Diagnosis Date   Bruises easily    Cancer (HCC) 04-16-09, 2012   Breast- bilateral   Hyperlipidemia    Hypertension    PONV (postoperative nausea and vomiting)    Thyroid disease     Past Surgical History:  Procedure Laterality Date   APPENDECTOMY     BREAST SURGERY  04-16-09   left  Lumpectomy   FRACTURE SURGERY     broke right ankle in the 80's   MASTECTOMY  November 2011   right breast   MASTECTOMY W/ SENTINEL NODE BIOPSY  03/22/2011   Procedure:  MASTECTOMY WITH SENTINEL LYMPH NODE BIOPSY;  Surgeon: Kandis Cocking, MD;  Location: [facility] OR;  Service: General;  Laterality: Left;  left mastectomy, sential lymph node biopsy, left axilla     reports that she has never smoked. She has never been exposed to tobacco smoke. She has never used smokeless tobacco. She reports that she does not drink alcohol and does not use drugs.  Allergies  Allergen Reactions   Haemophilus Influenzae Vaccines Nausea And Vomiting    Family History  Problem Relation Age of Onset   Heart disease Mother 68   Cancer Paternal Aunt        colon    Prior to Admission medications   Medication Sig Start Date End Date Taking? Authorizing Provider  amLODipine (NORVASC) 10 MG tablet Take 1 tablet by mouth once daily 01/31/23  Yes Georganna Skeans, MD  Calcium Carbonate-Vitamin D (CALCIUM PLUS VITAMIN D PO) Take 1 tablet by mouth daily.     Yes [provider]  carvedilol (COREG) 12.5 MG tablet Take 1 tablet (12.5 mg total) by mouth 2 (two) times daily with a meal. 12/09/22  Yes Georganna Skeans, MD  cholecalciferol (VITAMIN D3) 25 MCG (1000 UNIT) tablet Take 1,000 Units by mouth daily.   Yes [provider]  latanoprost (XALATAN) 0.005 % ophthalmic solution Place 1 drop into both eyes at  bedtime.  12/10/19  Yes [provider]  levothyroxine (SYNTHROID) 75 MCG tablet Take 1 tablet by mouth once daily 01/24/23  Yes Georganna Skeans, MD  levothyroxine (SYNTHROID) 75 MCG tablet Take 1 tablet (75 mcg total) by mouth daily. 01/24/23  Yes Georganna Skeans, MD  Multiple Vitamins-Minerals (PRESERVISION AREDS 2 PO) Take 1 tablet by mouth in the morning and at bedtime.    Yes [provider]  pantoprazole (PROTONIX) 20 MG tablet Take 1 tablet (20 mg total) by mouth daily. 12/23/22 03/23/23 Yes Georganna Skeans, MD  simvastatin (ZOCOR) 5 MG tablet Take 1 tablet (5 mg total) by mouth at bedtime. 12/09/22  Yes Georganna Skeans, MD  vitamin B-12 (CYANOCOBALAMIN) 1000 MCG  tablet Take 1,000 mcg by mouth daily.     Yes [provider]  vitamin C (ASCORBIC ACID) 500 MG tablet Take 500 mg by mouth daily.     Yes [provider]    Physical Exam: Vitals:   01/31/23 1645 01/31/23 1700 01/31/23 1730 01/31/23 1800  BP: (!) 146/66 (!) 136/56 (!) 160/71 (!) 164/66  Pulse: 79 76 88 90  Resp: 20 (!) 23 20 19   Temp:  99 F (37.2 C)    TempSrc:      SpO2: 95% 97% 97% 92%  Weight:      Height:        Constitutional: NAD, calm, comfortable Vitals:   01/31/23 1645 01/31/23 1700 01/31/23 1730 01/31/23 1800  BP: (!) 146/66 (!) 136/56 (!) 160/71 (!) 164/66  Pulse: 79 76 88 90  Resp: 20 (!) 23 20 19   Temp:  99 F (37.2 C)    TempSrc:      SpO2: 95% 97% 97% 92%  Weight:      Height:       Eyes: PERRL, lids and conjunctivae normal ENMT: Mucous membranes are moist. Posterior pharynx clear of any exudate or lesions.Normal dentition.  Neck: normal, supple, no masses, no thyromegaly Respiratory: +rhonchi L>R diminished at bases, no wheezing, no crackles. Normal respiratory effort. No accessory muscle use.  Cardiovascular: Regular rate and rhythm, no murmurs / rubs / gallops. No extremity edema. 2+ pedal pulses.   Abdomen: no tenderness, no masses palpated. No hepatosplenomegaly. Bowel sounds positive.  Musculoskeletal: no clubbing / cyanosis. No joint deformity upper and lower extremities. Good ROM, no contractures. Normal muscle tone.  Skin: no rashes, lesions, ulcers. No induration Neurologic: CN 2-12 grossly intact. Sensation intact, . Strength 5/5 in all 4.  Psychiatric: Normal judgment and insight. Alert and oriented x 3. Normal mood.    Labs on Admission: I have personally reviewed following labs and imaging studies  CBC: No results for input(s): "WBC", "NEUTROABS", "HGB", "HCT", "MCV", "PLT" in the last 168 hours. Basic Metabolic Panel: No results for input(s): "NA", "K", "CL", "CO2", "GLUCOSE", "BUN", "CREATININE", "CALCIUM", "MG",  "PHOS" in the last 168 hours. GFR: CrCl cannot be calculated (Patient's most recent lab result is older than the maximum 21 days allowed.). Liver Function Tests: No results for input(s): "AST", "ALT", "ALKPHOS", "BILITOT", "PROT", "ALBUMIN" in the last 168 hours. No results for input(s): "LIPASE", "AMYLASE" in the last 168 hours. No results for input(s): "AMMONIA" in the last 168 hours. Coagulation Profile: No results for input(s): "INR", "PROTIME" in the last 168 hours. Cardiac Enzymes: No results for input(s): "CKTOTAL", "CKMB", "CKMBINDEX", "TROPONINI" in the last 168 hours. BNP (last 3 results) No results for input(s): "PROBNP" in the last 8760 hours. HbA1C: No results for input(s): "HGBA1C" in the  last 72 hours. CBG: No results for input(s): "GLUCAP" in the last 168 hours. Lipid Profile: No results for input(s): "CHOL", "HDL", "LDLCALC", "TRIG", "CHOLHDL", "LDLDIRECT" in the last 72 hours. Thyroid Function Tests: No results for input(s): "TSH", "T4TOTAL", "FREET4", "T3FREE", "THYROIDAB" in the last 72 hours. Anemia Panel: No results for input(s): "VITAMINB12", "FOLATE", "FERRITIN", "TIBC", "IRON", "RETICCTPCT" in the last 72 hours. Urine analysis:    Component Value Date/Time   COLORURINE STRAW (A) 12/30/2019 0834   APPEARANCEUR CLEAR 12/30/2019 0834   LABSPEC 1.009 12/30/2019 0834   PHURINE 7.0 12/30/2019 0834   GLUCOSEU NEGATIVE 12/30/2019 0834   HGBUR NEGATIVE 12/30/2019 0834   BILIRUBINUR negative 12/15/2021 1525   KETONESUR negative 12/15/2021 1525   KETONESUR 5 (A) 12/30/2019 0834   PROTEINUR 30 (A) 12/30/2019 0834   UROBILINOGEN 0.2 12/15/2021 1525   NITRITE Positive (A) 12/15/2021 1525   NITRITE POSITIVE (A) 12/30/2019 0834   LEUKOCYTESUR Small (1+) (A) 12/15/2021 1525   LEUKOCYTESUR TRACE (A) 12/30/2019 0834    Radiological Exams on Admission: pending  EKG: Independently reviewed.n/a  Assessment/Plan Principal Problem:   Aspiration into airway Active  Problems:   Esophageal obstruction due to food impaction   Hiatal hernia   Hypoxemia   Acute esophagitis    Acute Food impaction s/p removal with associated mild esophagitis  Esophageal candidiasis  - per gi ppi bid with plan for repeat edg in 6 weeks  - fluconazole 200 mg x 13 days  Aspiration with hypoxemia -aggressive pulmonary toilet  - flutter valve, iss  -cxr pending  - levaquin , de-escalate in am based on clinical picutre  - monitor on continuous pulse ox   BRCA -s/p mastectomy/chemo  in remission  HTN -resume amlodipine and carvediolol    HLD  -resume statin   Hypothyroidism -resume synthroid   DVT prophylaxis: heparin Code Status: full/ as discussed per patient wishes in event of cardiac arrest  Family Communication: none at bedside Disposition Plan: patient  expected to be admitted greater than 2 midnights  Consults called: Gi DrPrytle Admission status: progressive    Lurline Del MD [facility]   If 7PM-7AM, please contact night-coverage [email or web address] Password [facility]  01/31/2023, 6:23 PM

## 2023-01-31 NOTE — ED Triage Notes (Signed)
Pt BIBPOV unable to swallow pills. Pt has a hiatal hernia and when she ate steak last night it got caught in her throat. Able to swallow some but not able to swallow pills.

## 2023-01-31 NOTE — Anesthesia Preprocedure Evaluation (Addendum)
Anesthesia Evaluation  Patient identified by MRN, date of birth, ID band Patient awake    Reviewed: Allergy & Precautions, NPO status , Patient's Chart, lab work & pertinent test results, reviewed documented beta blocker date and time   History of Anesthesia Complications Negative for: history of anesthetic complications  Airway Mallampati: II  TM Distance: >3 FB Neck ROM: Full    Dental no notable dental hx.    Pulmonary    Pulmonary exam normal        Cardiovascular hypertension, Pt. on medications and Pt. on home beta blockers Normal cardiovascular exam     Neuro/Psych negative neurological ROS     GI/Hepatic negative GI ROS, Neg liver ROS,,,  Endo/Other  Hypothyroidism    Renal/GU negative Renal ROS     Musculoskeletal negative musculoskeletal ROS (+)    Abdominal   Peds  Hematology negative hematology ROS (+)   Anesthesia Other Findings Day of surgery medications reviewed with patient.  Reproductive/Obstetrics negative OB ROS                             Anesthesia Physical Anesthesia Plan  ASA: 2 and emergent  Anesthesia Plan: General   Post-op Pain Management: Minimal or no pain anticipated   Induction: Intravenous and Rapid sequence  PONV Risk Score and Plan: 3 and Treatment may vary due to age or medical condition, Ondansetron and Dexamethasone  Airway Management Planned: Oral ETT  Additional Equipment: None  Intra-op Plan:   Post-operative Plan: Extubation in OR  Informed Consent: I have reviewed the patients History and Physical, chart, labs and discussed the procedure including the risks, benefits and alternatives for the proposed anesthesia with the patient or authorized representative who has indicated his/her understanding and acceptance.     Dental advisory given  Plan Discussed with: CRNA  Anesthesia Plan Comments:        Anesthesia Quick  Evaluation

## 2023-01-31 NOTE — ED Triage Notes (Addendum)
Pt c/o dysphagia started yesterday after she tried eating a steak, even though she said she knows she's not supposed to. Pt able to maintain secretions. Pt is eupneic. Pt has "spit up several pieces of steak," but can't swallow her medicine.

## 2023-01-31 NOTE — Transfer of Care (Signed)
Immediate Anesthesia Transfer of Care Note  Patient: Claudia Stein  Procedure(s) Performed: ESOPHAGOGASTRODUODENOSCOPY (EGD) WITH PROPOFOL FOREIGN BODY REMOVAL  Patient Location: PACU  Anesthesia Type:General  Level of Consciousness: awake and drowsy  Airway & Oxygen Therapy: Patient Spontanous Breathing and Patient connected to face mask oxygen  Post-op Assessment: Report given to RN and Post -op Vital signs reviewed and stable  Post vital signs: Reviewed and stable  Last Vitals:  Vitals Value Taken Time  BP 142/55 01/31/23 1545  Temp 37.2 C 01/31/23 1545  Pulse 71 01/31/23 1550  Resp 19 01/31/23 1550  SpO2 92 % 01/31/23 1550  Vitals shown include unfiled device data.  Last Pain:  Vitals:   01/31/23 1419  TempSrc: Temporal  PainSc: 0-No pain         Complications: No notable events documented.

## 2023-01-31 NOTE — Consult Note (Signed)
Consultation  Referring Provider:   Dr. Adela Stein Primary Care Physician:  Claudia Skeans, MD Primary Gastroenterologist:  Claudia Stein       Reason for Consultation:    food impaction  DOA: 01/31/2023         Hospital Day: 1         HPI:   DONYELLE Stein is a 89 y.o. female with past medical history significant for hypertension, hyperlipidemia, thyroid disease, history of breast cancer with surgery and radiation 2010 2011 2012.   09/29/2022 patient was in the ER for chest discomfort troponin negative CTA with negative PE found large sliding-type hiatal hernia, started on Protonix 20 mg and improved.  Presents to the ER with suspected food impaction after eating steak.  No workup in the ER but labs reviewed from May 29 May 29 calcium 10.8, glucose 107 Negative lipase, liver function unremarkable negative troponin, no anemia no leukocytosis  Patient with family at bedside, son. Provided some of the history.  Patient is never seen GI prior never had colonoscopy endoscopy.   No family history of esophageal or colon cancer. Patient denies history of tobacco use, alcohol use or drug use.  Denies NSAIDs. Has had reflux long-term is never been on medication until recently in May when she was having some chest discomfort, some dysphagia and weight loss then found to have large hiatal hernia and started on Protonix 20 mg daily with some improvement with dysphagia. She continued to have dysphagia with solid foods and son states she would chop up her food very finely and she well with some issues still being resolved with being able to drink water afterwards until last evening she was eating a steak when this became lodged mid esophagus.  She tried to take her pills and drink water but would have regurgitation. This is continued, she is been able to handle her secretions fairly well but she is spitting up in the ER when I am there with her. Patient has been sipping on soda last sip was 6 minutes ago  had some soda by an hour or 2 ago.  No food since last night. Has had some weight loss over the last 3 to 6 months about 10 to 15 pounds States bowel movements are fairly normal no melena or hematochezia.  Abnormal ED labs: Abnormal Labs Reviewed - No data to display  Past Medical History:  Diagnosis Date   Bruises easily    Cancer (HCC) 04-16-09, 2012   Breast- bilateral   Hyperlipidemia    Hypertension    PONV (postoperative nausea and vomiting)    Thyroid disease     Surgical History:  She  has a past surgical history that includes Breast surgery (04-16-09); Mastectomy (November 2011); Appendectomy; Fracture surgery; and Mastectomy w/ sentinel node biopsy (03/22/2011). Family History:  Her family history includes Cancer in her paternal aunt; Heart disease (age of onset: 10) in her mother. Social History:   reports that she has never smoked. She has never been exposed to tobacco smoke. She has never used smokeless tobacco. She reports that she does not drink alcohol and does not use drugs.  Prior to Admission medications   Medication Sig Start Date End Date Taking? Authorizing Provider  amLODipine (NORVASC) 10 MG tablet Take 1 tablet by mouth once daily 01/31/23   Claudia Skeans, MD  Calcium Carbonate-Vitamin D (CALCIUM PLUS VITAMIN D PO) Take 1 tablet by mouth daily.      [provider]  carvedilol (COREG) 12.5 MG tablet Take 1 tablet (12.5 mg total) by mouth 2 (two) times daily with a meal. 12/09/22   Claudia Skeans, MD  cholecalciferol (VITAMIN D3) 25 MCG (1000 UNIT) tablet Take 1,000 Units by mouth daily.    [provider]  latanoprost (XALATAN) 0.005 % ophthalmic solution Place 1 drop into both eyes at bedtime.  12/10/19   [provider]  levothyroxine (SYNTHROID) 75 MCG tablet Take 1 tablet by mouth once daily 01/24/23   Claudia Skeans, MD  levothyroxine (SYNTHROID) 75 MCG tablet Take 1 tablet (75 mcg total) by mouth daily. 01/24/23   Claudia Skeans, MD   Multiple Vitamins-Minerals (PRESERVISION AREDS 2 PO) Take 1 tablet by mouth in the morning and at bedtime.     [provider]  pantoprazole (PROTONIX) 20 MG tablet Take 1 tablet (20 mg total) by mouth daily. 12/23/22 03/23/23  Claudia Skeans, MD  simvastatin (ZOCOR) 5 MG tablet Take 1 tablet (5 mg total) by mouth at bedtime. 12/09/22   Claudia Skeans, MD  vitamin B-12 (CYANOCOBALAMIN) 1000 MCG tablet Take 1,000 mcg by mouth daily.      [provider]  vitamin C (ASCORBIC ACID) 500 MG tablet Take 500 mg by mouth daily.      [provider]    No current facility-administered medications for this encounter.   Current Outpatient Medications  Medication Sig Dispense Refill   amLODipine (NORVASC) 10 MG tablet Take 1 tablet by mouth once daily 90 tablet 0   Calcium Carbonate-Vitamin D (CALCIUM PLUS VITAMIN D PO) Take 1 tablet by mouth daily.       carvedilol (COREG) 12.5 MG tablet Take 1 tablet (12.5 mg total) by mouth 2 (two) times daily with a meal. 180 tablet 0   cholecalciferol (VITAMIN D3) 25 MCG (1000 UNIT) tablet Take 1,000 Units by mouth daily.     latanoprost (XALATAN) 0.005 % ophthalmic solution Place 1 drop into both eyes at bedtime.      levothyroxine (SYNTHROID) 75 MCG tablet Take 1 tablet by mouth once daily 90 tablet 0   levothyroxine (SYNTHROID) 75 MCG tablet Take 1 tablet (75 mcg total) by mouth daily. 90 tablet 0   Multiple Vitamins-Minerals (PRESERVISION AREDS 2 PO) Take 1 tablet by mouth in the morning and at bedtime.      pantoprazole (PROTONIX) 20 MG tablet Take 1 tablet (20 mg total) by mouth daily. 30 tablet 2   simvastatin (ZOCOR) 5 MG tablet Take 1 tablet (5 mg total) by mouth at bedtime. 90 tablet 0   vitamin B-12 (CYANOCOBALAMIN) 1000 MCG tablet Take 1,000 mcg by mouth daily.       vitamin C (ASCORBIC ACID) 500 MG tablet Take 500 mg by mouth daily.        Allergies as of 01/31/2023 - Review Complete 01/31/2023  Allergen Reaction Noted    Haemophilus influenzae vaccines Nausea And Vomiting 01/18/2022    Review of Systems:    Constitutional: No weight loss, fever, chills, weakness or fatigue HEENT: Eyes: No change in vision               Ears, Nose, Throat:  No change in hearing or congestion Skin: No rash or itching Cardiovascular: No chest pain, chest pressure or palpitations   Respiratory: No SOB or cough Gastrointestinal: See HPI and otherwise negative Genitourinary: No dysuria or change in urinary frequency Neurological: No headache, dizziness or syncope Musculoskeletal: No new muscle or joint pain Hematologic: No bleeding or bruising Psychiatric:  No history of depression or anxiety     Physical Exam:  Vital signs in last 24 hours: Temp:  [99.9 F (37.7 C)] 99.9 F (37.7 C) (09/30 1007) Pulse Rate:  [77] 77 (09/30 1007) Resp:  [18] 18 (09/30 1007) BP: (162)/(68) 162/68 (09/30 1007) SpO2:  [91 %] 91 % (09/30 1007) Weight:  [50.2 kg] 50.2 kg (09/30 1024)   Last BM recorded by nurses in past 5 days No data recorded  General:   Pleasant female in no acute distress Head:  Normocephalic and atraumatic. Eyes: sclerae anicteric,conjunctive pink  Heart:  regular rate and rhythm, no murmurs or gallops Pulm: Clear anteriorly; no wheezing Abdomen:  Soft, Non-distended AB, Active bowel sounds. mild tenderness in the epigastrium. Without guarding and Without rebound, No organomegaly appreciated. Extremities:  Without edema. Msk:  Symmetrical without gross deformities. Peripheral pulses intact.  Neurologic:  Alert and  oriented x4;  No focal deficits.  Skin:   Dry and intact without significant lesions or rashes. Psychiatric:  Cooperative. Normal mood and affect.  LAB RESULTS: No results for input(s): "WBC", "HGB", "HCT", "PLT" in the last 72 hours. BMET No results for input(s): "NA", "K", "CL", "CO2", "GLUCOSE", "BUN", "CREATININE", "CALCIUM" in the last 72 hours. LFT No results for input(s): "PROT", "ALBUMIN",  "AST", "ALT", "ALKPHOS", "BILITOT", "BILIDIR", "IBILI" in the last 72 hours. PT/INR No results for input(s): "LABPROT", "INR" in the last 72 hours.  STUDIES: No results found.    Impression /plan   Food impaction Likely secondary to hiatal hernia, possible radiation/GERD stricture, less likely malignancy History of radiation placed 2010 and 2012 for breast cancer CTA in May showed large sliding hiatal hernia symptoms improved slightly with 20 mg Protonix Patient is tolerating saliva but is spitting into emesis bag line there unable to tolerate anything liquid p.o. without regurgitation We will plan for EGD today emergently with Dr. Rhea Belton for food impaction I discussed risks of EGD with patient today, including risk of sedation, bleeding or perforation.  Patient provides understanding and gave verbal consent to proceed.  Hiatal hernia with GERD Likely benefit from Protonix twice daily   Active Problems:   * No active hospital problems. *    LOS: 0 days   Thank you for your kind consultation, we will continue to follow.   Doree Albee  01/31/2023, 2:08 PM

## 2023-01-31 NOTE — Progress Notes (Signed)
Patient arrives to 5W34 at this time

## 2023-02-01 ENCOUNTER — Encounter (HOSPITAL_COMMUNITY): Payer: Self-pay | Admitting: Internal Medicine

## 2023-02-01 ENCOUNTER — Telehealth: Payer: Self-pay

## 2023-02-01 DIAGNOSIS — W44F3XA Food entering into or through a natural orifice, initial encounter: Secondary | ICD-10-CM

## 2023-02-01 DIAGNOSIS — T18128A Food in esophagus causing other injury, initial encounter: Secondary | ICD-10-CM | POA: Diagnosis not present

## 2023-02-01 DIAGNOSIS — K209 Esophagitis, unspecified without bleeding: Secondary | ICD-10-CM | POA: Diagnosis not present

## 2023-02-01 DIAGNOSIS — T17908D Unspecified foreign body in respiratory tract, part unspecified causing other injury, subsequent encounter: Secondary | ICD-10-CM

## 2023-02-01 LAB — COMPREHENSIVE METABOLIC PANEL
ALT: 11 U/L (ref 0–44)
AST: 14 U/L — ABNORMAL LOW (ref 15–41)
Albumin: 2.7 g/dL — ABNORMAL LOW (ref 3.5–5.0)
Alkaline Phosphatase: 81 U/L (ref 38–126)
Anion gap: 5 (ref 5–15)
BUN: 17 mg/dL (ref 8–23)
CO2: 26 mmol/L (ref 22–32)
Calcium: 9 mg/dL (ref 8.9–10.3)
Chloride: 106 mmol/L (ref 98–111)
Creatinine, Ser: 0.84 mg/dL (ref 0.44–1.00)
GFR, Estimated: >60 mL/min (ref >60)
Glucose, Bld: 116 mg/dL — ABNORMAL HIGH (ref 70–99)
Potassium: 3.7 mmol/L (ref 3.5–5.1)
Sodium: 137 mmol/L (ref 135–145)
Total Bilirubin: 0.7 mg/dL (ref 0.3–1.2)
Total Protein: 6 g/dL — ABNORMAL LOW (ref 6.5–8.1)

## 2023-02-01 LAB — URINALYSIS, W/ REFLEX TO CULTURE (INFECTION SUSPECTED)
Bilirubin Urine: NEGATIVE
Glucose, UA: NEGATIVE mg/dL
Hgb urine dipstick: NEGATIVE
Ketones, ur: NEGATIVE mg/dL
Nitrite: NEGATIVE
Protein, ur: NEGATIVE mg/dL
Specific Gravity, Urine: 1.014 (ref 1.005–1.030)
pH: 5 (ref 5.0–8.0)

## 2023-02-01 LAB — CBC
HCT: 36.3 % (ref 36.0–46.0)
Hemoglobin: 11.8 g/dL — ABNORMAL LOW (ref 12.0–15.0)
MCH: 28.5 pg (ref 26.0–34.0)
MCHC: 32.5 g/dL (ref 30.0–36.0)
MCV: 87.7 fL (ref 80.0–100.0)
Platelets: 185 10*3/uL (ref 150–400)
RBC: 4.14 MIL/uL (ref 3.87–5.11)
RDW: 14.5 % (ref 11.5–15.5)
WBC: 16.1 10*3/uL — ABNORMAL HIGH (ref 4.0–10.5)
nRBC: 0 % (ref 0.0–0.2)

## 2023-02-01 LAB — C-REACTIVE PROTEIN: CRP: 23.3 mg/dL — ABNORMAL HIGH (ref <1.0)

## 2023-02-01 LAB — PROCALCITONIN: Procalcitonin: 1.86 ng/mL

## 2023-02-01 MED ORDER — PNEUMOCOCCAL 20-VAL CONJ VACC 0.5 ML IM SUSY
0.5000 mL | PREFILLED_SYRINGE | INTRAMUSCULAR | Status: DC
  Filled 2023-02-01: qty 0.5

## 2023-02-01 MED ORDER — INFLUENZA VAC A&B SURF ANT ADJ 0.5 ML IM SUSY
0.5000 mL | PREFILLED_SYRINGE | INTRAMUSCULAR | Status: DC
  Filled 2023-02-01: qty 0.5

## 2023-02-01 MED ORDER — FUROSEMIDE 10 MG/ML IJ SOLN
20.0000 mg | Freq: Once | INTRAMUSCULAR | Status: AC
  Administered 2023-02-01: 20 mg via INTRAVENOUS
  Filled 2023-02-01: qty 2

## 2023-02-01 MED ORDER — SODIUM CHLORIDE 0.9 % IV SOLN
1.5000 g | Freq: Three times a day (TID) | INTRAVENOUS | Status: DC
  Administered 2023-02-01 – 2023-02-02 (×4): 1.5 g via INTRAVENOUS
  Filled 2023-02-01 (×5): qty 4

## 2023-02-01 MED ORDER — POTASSIUM CHLORIDE 20 MEQ PO PACK
40.0000 meq | PACK | Freq: Once | ORAL | Status: AC
  Administered 2023-02-01: 40 meq via ORAL
  Filled 2023-02-01: qty 2

## 2023-02-01 MED ORDER — IPRATROPIUM-ALBUTEROL 0.5-2.5 (3) MG/3ML IN SOLN
3.0000 mL | Freq: Three times a day (TID) | RESPIRATORY_TRACT | Status: DC
  Filled 2023-02-01: qty 3

## 2023-02-01 NOTE — Evaluation (Signed)
Physical Therapy Evaluation Patient Details Name: Claudia Stein MRN: [id number] DOB: 24-Oct-1933 Today's Date: 02/01/2023  History of Present Illness  89 y/o female presents [facility] 01/31/23 after having impacted food bolus. Pt w/ intubation/extubation on 9/30 with endoscopy. Pt had esophageal obstruction in lower 3rd of esophagus, benign stenosis, hiatal hernia and non bleeding duodenal diverticulum. PMHx: cancer, HTN, bruises easily   Clinical Impression  Pt in recliner upon arrival and agreeable to PT eval. Pt presents to therapy session close to mobility baseline with decreased balance, strength, and functional mobility. Pt was able to stand with CGA and RW. Without RW, pt reaches for support due to unsteadiness. Pt had 1 LOB when ambulating corrected with MinA support and a lateral step. Pt was able to assist with pericare in standing after episode of incontinence. Pt would benefit from acute skilled PT to address functional impairments. Recommending post-acute HHPT to prevent future falls and increase independence. Acute PT to follow.          If plan is discharge home, recommend the following: A little help with walking and/or transfers;A little help with bathing/dressing/bathroom;Help with stairs or ramp for entrance   Can travel by private vehicle        Equipment Recommendations None recommended by PT (pt owns equipment)  Recommendations for Other Services       Functional Status Assessment Patient has had a recent decline in their functional status and demonstrates the ability to make significant improvements in function in a reasonable and predictable amount of time.     Precautions / Restrictions Precautions Precautions: Fall Restrictions Weight Bearing Restrictions: No      Mobility  Bed Mobility    General bed mobility comments: in recliner upon arrival, returned to recliner    Transfers Overall transfer level: Needs assistance Equipment used: Rolling walker (2  wheels) Transfers: Sit to/from Stand Sit to Stand: Contact guard assist           General transfer comment: quick with transfer, cues to keep RW close with proper hand placement. CGA for safety    Ambulation/Gait Ambulation/Gait assistance: Min assist Gait Distance (Feet): 40 Feet Assistive device: Rolling walker (2 wheels) Gait Pattern/deviations: Step-through pattern, Trunk flexed, Narrow base of support       General Gait Details: pt walks quickly with slight unsteadiness. Pt has 1 LOB with MinA to correct. Cues to stay close to RW and to slow gait.        Balance Overall balance assessment: Mild deficits observed, not formally tested, Needs assistance Sitting-balance support: No upper extremity supported, Feet supported Sitting balance-Leahy Scale: Fair Sitting balance - Comments: in recliner   Standing balance support: Bilateral upper extremity supported, During functional activity, Reliant on assistive device for balance Standing balance-Leahy Scale: Poor Standing balance comment: reliant on RW for support          Pertinent Vitals/Pain Pain Assessment Pain Assessment: No/denies pain    Home Living Family/patient expects to be discharged to:: Private residence Living Arrangements: Children (son) Available Help at Discharge: Family;Available PRN/intermittently Type of Home: House Home Access: Stairs to enter Entrance Stairs-Rails: Can reach both Entrance Stairs-Number of Steps: 4   Home Layout: One level Home Equipment: Cane - single Librarian, academic (2 wheels);BSC/3in1;Wheelchair - manual      Prior Function Prior Level of Function : Independent/Modified Independent        Mobility Comments: I w/ SP cane, 1 fall over a year ago ADLs Comments: I  Extremity/Trunk Assessment   Upper Extremity Assessment Upper Extremity Assessment: Overall WFL for tasks assessed    Lower Extremity Assessment Lower Extremity Assessment: Generalized  weakness    Cervical / Trunk Assessment Cervical / Trunk Assessment: Kyphotic (R scoliosis)  Communication   Communication Communication: No apparent difficulties Cueing Techniques: Verbal cues  Cognition Arousal: Alert Behavior During Therapy: WFL for tasks assessed/performed Overall Cognitive Status: Within Functional Limits for tasks assessed         General Comments General comments (skin integrity, edema, etc.): VSS on RA     PT Assessment Patient needs continued PT services  PT Problem List Decreased strength;Decreased activity tolerance;Decreased balance;Decreased mobility;Decreased safety awareness;Decreased knowledge of use of DME       PT Treatment Interventions DME instruction;Neuromuscular re-education;Gait training;Functional mobility training;Stair training;Therapeutic activities;Therapeutic exercise;Balance training    PT Goals (Current goals can be found in the Care Plan section)  Acute Rehab PT Goals Patient Stated Goal: to improve balance PT Goal Formulation: With patient Time For Goal Achievement: 02/15/23 Potential to Achieve Goals: Good    Frequency Min 1X/week        AM-PAC PT "6 Clicks" Mobility  Outcome Measure Help needed turning from your back to your side while in a flat bed without using bedrails?: None Help needed moving from lying on your back to sitting on the side of a flat bed without using bedrails?: None Help needed moving to and from a bed to a chair (including a wheelchair)?: A Little Help needed standing up from a chair using your arms (e.g., wheelchair or bedside chair)?: A Little Help needed to walk in hospital room?: A Little Help needed climbing 3-5 steps with a railing? : A Little 6 Click Score: 20    End of Session   Activity Tolerance: Patient tolerated treatment well Patient left: in chair;with call bell/phone within reach (no alarm upon entry, pt agrees to call nursing for mobility) Nurse Communication: Mobility  status PT Visit Diagnosis: Unsteadiness on feet (R26.81);Repeated falls (R29.6);Muscle weakness (generalized) (M62.81)    Time: 1610-9604 PT Time Calculation (min) (ACUTE ONLY): 26 min   Charges:     PT Treatments $Therapeutic Activity: 8-22 mins PT General Charges $$ ACUTE PT VISIT: 1 Visit         Hilton Cork, PT, DPT Secure Chat Preferred  Rehab Office [phone]   Arturo Morton Brion Aliment 02/01/2023, 10:40 AM

## 2023-02-01 NOTE — Telephone Encounter (Signed)
Pt scheduled to see Dr. Rhea Belton 03/21/23 at 9:30am. EGD scheduled in the [facility] 04/08/23 at 10am. She will need prep instructions at the office visit. Appt letter mailed to patient.

## 2023-02-01 NOTE — Plan of Care (Signed)

## 2023-02-01 NOTE — Telephone Encounter (Signed)
-----   Message from Doree Albee sent at 02/01/2023  1:39 PM EDT ----- Regarding: Please set up a hospital follow up! Thanks! Patient of Dr. Rhea Belton Can we arrange for office follow up 6-8 weeks for food impaction/grade D esophagitis, will need OV and be set up for EGD outpatient.  Thanks, Marchelle Folks

## 2023-02-01 NOTE — Progress Notes (Signed)
Unasyn 1.5g IV q8 x 5days per Dr. Thedore Mins for aspiration PNA.  Ulyses Southward, PharmD, BCIDP, AAHIVP, CPP Infectious Disease Pharmacist 02/01/2023 8:20 AM

## 2023-02-01 NOTE — Evaluation (Signed)
Clinical/Bedside Swallow Evaluation Patient Details  Name: Claudia Stein MRN: [id number] Date of Birth: [birth year]  Today's Date: 02/01/2023 Time: SLP Start Time (ACUTE ONLY): 4098 SLP Stop Time (ACUTE ONLY): 0919 SLP Time Calculation (min) (ACUTE ONLY): 26 min  Past Medical History:  Past Medical History:  Diagnosis Date   Bruises easily    Cancer (HCC) 04-16-09, 2012   Breast- bilateral   Hyperlipidemia    Hypertension    PONV (postoperative nausea and vomiting)    Thyroid disease    Past Surgical History:  Past Surgical History:  Procedure Laterality Date   APPENDECTOMY     BREAST SURGERY  04-16-09   left  Lumpectomy   FRACTURE SURGERY     broke right ankle in the 80's   MASTECTOMY  November 2011   right breast   MASTECTOMY W/ SENTINEL NODE BIOPSY  03/22/2011   Procedure: MASTECTOMY WITH SENTINEL LYMPH NODE BIOPSY;  Surgeon: Kandis Cocking, MD;  Location: [facility] OR;  Service: General;  Laterality: Left;  left mastectomy, sential lymph node biopsy, left axilla   HPI:  Pt is an 89 yo female presenting with difficulty swallowing after she ate a piece of steak and felt like it got stuck. EGD 9/30 removed food from her middle and lower third of the esophagus and also revealed candidiasis, esophagitis, stenosis, HH. There was also concern for aspiration with hypoxemia prior to procedure being done. MBS in April 2024 was WNL but did note a prominent CP. There was concern about pt fully masticating certain foods though. PMH includes: GERD, large HH, breast ca s/p surgery and XRT, HTN, HLD, thyroid disease    Assessment / Plan / Recommendation  Clinical Impression  Pt's oropharyngeal swallow appears to be [facility]. She has no overt s/s of aspiration, and although she describes episodes of "choking" at home, what she is describing is episodes of solids (especially meats) feeling stuck. At home she already cuts her food up into small pieces. I would hesitate to advance her beyond a Dys 2 diet at  least at first when considering meats in particular. SLP reviewed additional esophageal precautions.  SLP Visit Diagnosis: Dysphagia, unspecified (R13.10)    Aspiration Risk  Mild aspiration risk    Diet Recommendation Dysphagia 2 (Fine chop);Thin liquid    Liquid Administration via: Cup;Straw Medication Administration: Whole meds with liquid Supervision: Patient able to self feed;Intermittent supervision to cue for compensatory strategies Compensations: Slow rate;Small sips/bites;Follow solids with liquid Postural Changes: Seated upright at 90 degrees;Remain upright for at least 30 minutes after po intake    Other  Recommendations Oral Care Recommendations: Oral care BID    Recommendations for follow up therapy are one component of a multi-disciplinary discharge planning process, led by the attending physician.  Recommendations may be updated based on patient status, additional functional criteria and insurance authorization.  Follow up Recommendations No SLP follow up      Assistance Recommended at Discharge    Functional Status Assessment Patient has not had a recent decline in their functional status  Frequency and Duration min 1 x/week  1 week       Prognosis Prognosis for improved oropharyngeal function: Good      Swallow Study   General HPI: Pt is an 89 yo female presenting with difficulty swallowing after she ate a piece of steak and felt like it got stuck. EGD 9/30 removed food from her middle and lower third of the esophagus and also revealed candidiasis, esophagitis, stenosis, HH. There  was also concern for aspiration with hypoxemia prior to procedure being done. MBS in April 2024 was WNL but did note a prominent CP. There was concern about pt fully masticating certain foods though. PMH includes: GERD, large HH, breast ca s/p surgery and XRT, HTN, HLD, thyroid disease Type of Study: Bedside Swallow Evaluation Previous Swallow Assessment: see HPI Diet Prior to this  Study: Clear liquid diet;Thin liquids (Level 0) Temperature Spikes Noted: No Respiratory Status: Room air Behavior/Cognition: Alert;Cooperative;Pleasant mood;Requires cueing Oral Cavity Assessment: Within Functional Limits Oral Care Completed by SLP: No Oral Cavity - Dentition: Dentures, top;Dentures, bottom Vision: Functional for self-feeding Self-Feeding Abilities: Able to feed self Patient Positioning: Upright in chair Baseline Vocal Quality: Normal Volitional Cough: Strong Volitional Swallow: Able to elicit    Oral/Motor/Sensory Function Overall Oral Motor/Sensory Function: Within functional limits   Ice Chips Ice chips: Not tested   Thin Liquid Thin Liquid: Within functional limits Presentation: Self Fed;Straw    Nectar Thick Nectar Thick Liquid: Not tested   Honey Thick Honey Thick Liquid: Not tested   Puree Puree: Within functional limits Presentation: Self Fed;Spoon   Solid     Solid: Within functional limits Presentation: Self Fed;Spoon      Mahala Menghini., M.A. CCC-SLP Acute Rehabilitation Services Office [phone]  Secure chat preferred  02/01/2023,9:25 AM

## 2023-02-01 NOTE — Progress Notes (Signed)
PROGRESS NOTE                                                                                                                                                                                                             Patient Demographics:    Claudia Stein, is a 89 y.o. female, DOB - 1934/04/29, ZOX:[id number]  Outpatient Primary MD for the patient is Georganna Skeans, MD    LOS - 1  Admit date - 01/31/2023    Chief Complaint  Patient presents with   Dysphagia       Brief Narrative (HPI from H&P)    89 y.o. female with medical history significant of BRCA in remission, HTN, HLD, hypothyroidism , who presents to ED after eating meal with steak leading to  impacted food bolus. Patient notes she can swallow her saliva but unable to swallow pills or much else.  Was brought to the hospital where she was diagnosed with esophageal stricture with large food, possible aspiration pneumonia, she was seen by GI underwent EGD for dislodgment of food, findings suggestive of possible esophageal candidiasis and benign stricture.   Subjective:    Claudia Stein today has, No headache, No chest pain, No abdominal pain - No Nausea, No new weakness tingling or numbness, no shortness of breath but does have a cough   Assessment  & Plan :   Acute Food impaction s/p removal with associated mild esophagitis - Esophageal candidiasis - seen by [facility], underwent EGD with findings suggestive of possible esophageal candidiasis and benign stricture, food has been dislodged, currently tolerating clears, speech to follow-up.  Per GI advance up to soft for now.  Continue fluconazole 200 mg daily for a total of 12 more days stop date 02/13/2023.  Post discharge follow-up with [facility], continue PPI.   Aspiration with hypoxemia  - likely due to #1 above, was still hypoxic with acute hypoxic respiratory failure, placed on oxygen, continue Unasyn, check MRSA nasal  PCR, gentle Lasix x 1 on 02/01/2023 as she has some crackles as well, stop IV fluids.  I-S and flutter valve.  Advance activity and titrate down oxygen.  BRCA -s/p mastectomy/chemo  in remission   HTN -resume amlodipine and carvediolol     HLD -resume statin    Hypothyroidism -resume synthroid         Condition -  Guarded  Family Communication  : Levada Schilling [phone]  on 02/01/2023  Code Status :  Full  Consults  :  GI  PUD Prophylaxis : PPI   Procedures  :     EGD on 01/31/2023.  Possible Candida esophagitis.  Benign-appearing stricture      Disposition Plan  :    Status is: Inpatient   DVT Prophylaxis  :    heparin injection 5,000 Units Start: 01/31/23 2200    Lab Results  Component Value Date   PLT 185 02/01/2023    Diet :  Diet Order             Diet clear liquid Room service appropriate? Yes; Fluid consistency: Thin  Diet effective now                    Inpatient Medications  Scheduled Meds:  amLODipine  10 mg Oral Daily   carvedilol  12.5 mg Oral BID WC   fluconazole  200 mg Oral Daily   heparin  5,000 Units Subcutaneous Q8H   levothyroxine  75 mcg Oral Daily   pantoprazole  40 mg Oral BID AC   simvastatin  5 mg Oral QHS   Continuous Infusions:  cefTRIAXone (ROCEPHIN)  IV Stopped (01/31/23 2252)   PRN Meds:.acetaminophen **OR** acetaminophen, albuterol, ondansetron **OR** ondansetron (ZOFRAN) IV    Objective:   Vitals:   01/31/23 2300 02/01/23 0000 02/01/23 0200 02/01/23 0400  BP: (!) 125/53   (!) 116/53  Pulse:  83 84 77  Resp:  20 19 18   Temp: 97.8 F (36.6 C)   97.7 F (36.5 C)  TempSrc: Oral   Oral  SpO2: 92% 91% 93% 90%  Weight:    51.5 kg  Height:        Wt Readings from Last 3 Encounters:  02/01/23 51.5 kg  12/23/22 50.2 kg  10/12/22 50.8 kg     Intake/Output Summary (Last 24 hours) at 02/01/2023 0814 Last data filed at 02/01/2023 7253 Gross per 24 hour  Intake 897.62 ml  Output --  Net 897.62 ml      Physical Exam  Awake Alert, No new F.N deficits, Normal affect [place].AT,PERRAL Supple Neck, No JVD,   Symmetrical Chest wall movement, Good air movement bilaterally, few basilar rales RRR,No Gallops,Rubs or new Murmurs,  +ve B.Sounds, Abd Soft, No tenderness,   No Cyanosis, Clubbing or edema        Data Review:    Recent Labs  Lab 01/31/23 1848 01/31/23 2114 02/01/23 0330  WBC 15.2* 15.2* 16.1*  HGB 13.7 12.2 11.8*  HCT 40.6 36.6 36.3  PLT 216 194 185  MCV 88.1 86.7 87.7  MCH 29.7 28.9 28.5  MCHC 33.7 33.3 32.5  RDW 14.4 14.6 14.5  LYMPHSABS  --  0.9  --   MONOABS  --  0.4  --   EOSABS  --  0.0  --   BASOSABS  --  0.0  --     Recent Labs  Lab 01/31/23 1848 01/31/23 2030 01/31/23 2114 02/01/23 0330  NA  --   --  138 137  K  --   --  3.4* 3.7  CL  --   --  100 106  CO2  --   --  22 26  ANIONGAP  --   --  16* 5  GLUCOSE  --   --  192* 116*  BUN  --   --  15 17  CREATININE 0.84  --  1.08* 0.84  AST  --   --  23 14*  ALT  --   --  13 11  ALKPHOS  --   --  90 81  BILITOT  --   --  0.9 0.7  ALBUMIN  --   --  2.7* 2.7*  TSH 1.498  --  1.084  --   HGBA1C 5.3  --  5.3  --   BNP  --  432.5*  --   --   CALCIUM  --   --  9.2 9.0      Recent Labs  Lab 01/31/23 1848 01/31/23 2030 01/31/23 2114 02/01/23 0330  TSH 1.498  --  1.084  --   HGBA1C 5.3  --  5.3  --   BNP  --  432.5*  --   --   CALCIUM  --   --  9.2 9.0    --------------------------------------------------------------------------------------------------------------- No results found for: "CHOL", "HDL", "LDLCALC", "LDLDIRECT", "TRIG", "CHOLHDL"  Lab Results  Component Value Date   HGBA1C 5.3 01/31/2023   Recent Labs    01/31/23 2114  TSH 1.084      Radiology Reports Portable chest 1 View  Result Date: 01/31/2023 CLINICAL DATA:  Possible aspiration EXAM: PORTABLE CHEST 1 VIEW COMPARISON:  01/31/2023, CT 09/29/2022, chest x-ray 09/29/2022 FINDINGS: Cardiomegaly. Patchy lower lung  opacities. Moderate hiatal hernia. Mild diffuse interstitial opacity. No sizable effusion. No pneumothorax IMPRESSION: 1. Cardiomegaly with mild diffuse interstitial opacity could reflect low-grade edema 2. More patchy airspace opacities at both bases could be due to atelectasis or pneumonia 3. Hiatal hernia Electronically Signed   By: Jasmine Pang M.D.   On: 01/31/2023 23:14   DG CHEST PORT 1 VIEW  Result Date: 01/31/2023 CLINICAL DATA:  Hypoxia and dysphagia EXAM: PORTABLE CHEST 1 VIEW COMPARISON:  09/29/2022 FINDINGS: Cardiac enlargement similar to prior study. Bronchiectasis with bronchial wall thickening consistent with chronic bronchitis. New interstitial and alveolar opacities in the lung bases since prior study, possibly edema or pneumonia. Probable small right pleural effusion. No pneumothorax. Apical pleural calcifications. Calcification of the aorta. Old rib fractures. Surgical clips in the axilla. IMPRESSION: Cardiac enlargement with new pulmonary infiltrates, likely edema or pneumonia. Electronically Signed   By: Burman Nieves M.D.   On: 01/31/2023 19:04      Signature  -   Susa Raring M.D on 02/01/2023 at 8:14 AM   -  To page go to [email or web address]

## 2023-02-01 NOTE — Progress Notes (Signed)
Progress Note  Primary GI: Dr. Rhea Belton DOA: 01/31/2023         Hospital Day: 2   Subjective  Chief Complaint: food impaction  Patient with family at bedside, son and daugheter. Provided some of the history.  Eating soft foods, sitting at chair.  Tolerating well.  No Oxygen, doing well. Denies SOB, chest pain.     Objective   Vital signs in last 24 hours: Temp:  [97.7 F (36.5 C)-99 F (37.2 C)] 97.9 F (36.6 C) (10/01 0800) Pulse Rate:  [73-99] 92 (10/01 0800) Resp:  [16-26] 26 (10/01 0800) BP: (116-164)/(52-71) 144/54 (10/01 0800) SpO2:  [90 %-97 %] 90 % (10/01 0800) Weight:  [51.5 kg] 51.5 kg (10/01 0400) Last BM Date : 01/30/23 Last BM recorded by nurses in past 5 days No data recorded  General:   female in no acute distress  Heart:  Regular rate and rhythm; no murmurs Pulm: Clear anteriorly; mild coarse/rhonchi breath sounds posteriorly Abdomen:  Soft, Obese AB, Active bowel sounds. No tenderness .  Extremities:  without  edema. Neurologic:  Alert and  oriented x4;  No focal deficits.  Psych:  Cooperative. Normal mood and affect.  Intake/Output from previous day: 09/30 0701 - 10/01 0700 In: 897.6 [I.V.:600; IV Piggyback:297.6] Out: -  Intake/Output this shift: No intake/output data recorded.  Studies/Results: Portable chest 1 View  Result Date: 01/31/2023 CLINICAL DATA:  Possible aspiration EXAM: PORTABLE CHEST 1 VIEW COMPARISON:  01/31/2023, CT 09/29/2022, chest x-ray 09/29/2022 FINDINGS: Cardiomegaly. Patchy lower lung opacities. Moderate hiatal hernia. Mild diffuse interstitial opacity. No sizable effusion. No pneumothorax IMPRESSION: 1. Cardiomegaly with mild diffuse interstitial opacity could reflect low-grade edema 2. More patchy airspace opacities at both bases could be due to atelectasis or pneumonia 3. Hiatal hernia Electronically Signed   By: Jasmine Pang M.D.   On: 01/31/2023 23:14   DG CHEST PORT 1 VIEW  Result Date: 01/31/2023 CLINICAL DATA:   Hypoxia and dysphagia EXAM: PORTABLE CHEST 1 VIEW COMPARISON:  09/29/2022 FINDINGS: Cardiac enlargement similar to prior study. Bronchiectasis with bronchial wall thickening consistent with chronic bronchitis. New interstitial and alveolar opacities in the lung bases since prior study, possibly edema or pneumonia. Probable small right pleural effusion. No pneumothorax. Apical pleural calcifications. Calcification of the aorta. Old rib fractures. Surgical clips in the axilla. IMPRESSION: Cardiac enlargement with new pulmonary infiltrates, likely edema or pneumonia. Electronically Signed   By: Burman Nieves M.D.   On: 01/31/2023 19:04    Lab Results: Recent Labs    01/31/23 1848 01/31/23 2114 02/01/23 0330  WBC 15.2* 15.2* 16.1*  HGB 13.7 12.2 11.8*  HCT 40.6 36.6 36.3  PLT 216 194 185   BMET Recent Labs    01/31/23 1848 01/31/23 2114 02/01/23 0330  NA  --  138 137  K  --  3.4* 3.7  CL  --  100 106  CO2  --  22 26  GLUCOSE  --  192* 116*  BUN  --  15 17  CREATININE 0.84 1.08* 0.84  CALCIUM  --  9.2 9.0   LFT Recent Labs    02/01/23 0330  PROT 6.0*  ALBUMIN 2.7*  AST 14*  ALT 11  ALKPHOS 81  BILITOT 0.7   PT/INR No results for input(s): "LABPROT", "INR" in the last 72 hours.   Scheduled Meds:  amLODipine  10 mg Oral Daily   carvedilol  12.5 mg Oral BID WC   fluconazole  200 mg Oral Daily  heparin  5,000 Units Subcutaneous Q8H   levothyroxine  75 mcg Oral Daily   pantoprazole  40 mg Oral BID AC   simvastatin  5 mg Oral QHS   Continuous Infusions:  ampicillin-sulbactam (UNASYN) IV 1.5 g (02/01/23 1023)      Impression/Plan:   Food impaction CTA in May showed large sliding hiatal hernia EGD 09/30 grade D esophagitis, candidiasis, benign stricture with food removal, 5 cm HH, nonbleeding diverticulum duodenum.  -Continue soft/dysphagia 2 diet, discussed with the family at bedside -Protonix 40 mg BID outpatient 2 to 3 months -Fluconazole 400 mg x 1 day and  200 mg daily for 13 days for Candida esophagitis -Will arrange for outpatient follow-up with our office with repeat upper endoscopy in 6 weeks to evaluate grade D esophagitis healing as well as potential dilation.  Hiatal hernia with GERD Continue Protonix 40 mg twice daily until EGD  Aspiration with hypoxemia Off oxygen, on Unasyn SLP evaluation today, suggesting dysphagia 2 diet   Principal Problem:   Aspiration into airway Active Problems:   Esophageal obstruction due to food impaction   Hiatal hernia   Hypoxemia   Acute esophagitis    LOS: 1 day   Doree Albee  02/01/2023, 12:20 PM

## 2023-02-01 NOTE — Anesthesia Postprocedure Evaluation (Signed)
Anesthesia Post Note  Patient: Claudia Stein  Procedure(s) Performed: ESOPHAGOGASTRODUODENOSCOPY (EGD) WITH PROPOFOL FOREIGN BODY REMOVAL     Patient location during evaluation: PACU Anesthesia Type: General Level of consciousness: awake and alert Pain management: pain level controlled Vital Signs Assessment: post-procedure vital signs reviewed and stable Respiratory status: spontaneous breathing, nonlabored ventilation and respiratory function stable Cardiovascular status: blood pressure returned to baseline Postop Assessment: no apparent nausea or vomiting Anesthetic complications: no   No notable events documented.    Shanda Howells

## 2023-02-02 ENCOUNTER — Other Ambulatory Visit (HOSPITAL_COMMUNITY): Payer: Self-pay

## 2023-02-02 DIAGNOSIS — T18128A Food in esophagus causing other injury, initial encounter: Secondary | ICD-10-CM | POA: Diagnosis not present

## 2023-02-02 DIAGNOSIS — T17908D Unspecified foreign body in respiratory tract, part unspecified causing other injury, subsequent encounter: Secondary | ICD-10-CM | POA: Diagnosis not present

## 2023-02-02 DIAGNOSIS — W44F3XA Food entering into or through a natural orifice, initial encounter: Secondary | ICD-10-CM | POA: Diagnosis not present

## 2023-02-02 DIAGNOSIS — K209 Esophagitis, unspecified without bleeding: Secondary | ICD-10-CM | POA: Diagnosis not present

## 2023-02-02 LAB — BASIC METABOLIC PANEL
Anion gap: 9 (ref 5–15)
BUN: 23 mg/dL (ref 8–23)
CO2: 25 mmol/L (ref 22–32)
Calcium: 9.3 mg/dL (ref 8.9–10.3)
Chloride: 105 mmol/L (ref 98–111)
Creatinine, Ser: 0.94 mg/dL (ref 0.44–1.00)
GFR, Estimated: 59 mL/min — ABNORMAL LOW (ref >60)
Glucose, Bld: 93 mg/dL (ref 70–99)
Potassium: 3.9 mmol/L (ref 3.5–5.1)
Sodium: 139 mmol/L (ref 135–145)

## 2023-02-02 LAB — CBC WITH DIFFERENTIAL/PLATELET
Abs Immature Granulocytes: 0.08 10*3/uL — ABNORMAL HIGH (ref 0.00–0.07)
Basophils Absolute: 0 10*3/uL (ref 0.0–0.1)
Basophils Relative: 0 %
Eosinophils Absolute: 0.1 10*3/uL (ref 0.0–0.5)
Eosinophils Relative: 1 %
HCT: 35.9 % — ABNORMAL LOW (ref 36.0–46.0)
Hemoglobin: 11.7 g/dL — ABNORMAL LOW (ref 12.0–15.0)
Immature Granulocytes: 1 %
Lymphocytes Relative: 13 %
Lymphs Abs: 1.7 10*3/uL (ref 0.7–4.0)
MCH: 28.5 pg (ref 26.0–34.0)
MCHC: 32.6 g/dL (ref 30.0–36.0)
MCV: 87.6 fL (ref 80.0–100.0)
Monocytes Absolute: 0.9 10*3/uL (ref 0.1–1.0)
Monocytes Relative: 7 %
Neutro Abs: 10 10*3/uL — ABNORMAL HIGH (ref 1.7–7.7)
Neutrophils Relative %: 78 %
Platelets: 177 10*3/uL (ref 150–400)
RBC: 4.1 MIL/uL (ref 3.87–5.11)
RDW: 14.3 % (ref 11.5–15.5)
WBC: 12.8 10*3/uL — ABNORMAL HIGH (ref 4.0–10.5)
nRBC: 0 % (ref 0.0–0.2)

## 2023-02-02 LAB — PROCALCITONIN: Procalcitonin: 1.42 ng/mL

## 2023-02-02 LAB — MAGNESIUM: Magnesium: 1.8 mg/dL (ref 1.7–2.4)

## 2023-02-02 LAB — URINE CULTURE

## 2023-02-02 LAB — C-REACTIVE PROTEIN: CRP: 14.7 mg/dL — ABNORMAL HIGH (ref <1.0)

## 2023-02-02 LAB — BRAIN NATRIURETIC PEPTIDE: B Natriuretic Peptide: 203.1 pg/mL — ABNORMAL HIGH (ref 0.0–100.0)

## 2023-02-02 MED ORDER — FLUCONAZOLE 200 MG PO TABS
200.0000 mg | ORAL_TABLET | Freq: Every day | ORAL | 0 refills | Status: AC
  Filled 2023-02-02: qty 11, 11d supply, fill #0

## 2023-02-02 MED ORDER — FLUCONAZOLE 200 MG PO TABS
200.0000 mg | ORAL_TABLET | Freq: Every day | ORAL | 0 refills | Status: DC
  Filled 2023-02-02: qty 22, 22d supply, fill #0

## 2023-02-02 MED ORDER — LOPERAMIDE HCL 2 MG PO CAPS
2.0000 mg | ORAL_CAPSULE | Freq: Once | ORAL | Status: AC | PRN
  Administered 2023-02-02: 2 mg via ORAL
  Filled 2023-02-02: qty 1

## 2023-02-02 MED ORDER — PANTOPRAZOLE SODIUM 40 MG PO TBEC
40.0000 mg | DELAYED_RELEASE_TABLET | Freq: Two times a day (BID) | ORAL | 0 refills | Status: DC
  Filled 2023-02-02: qty 60, 30d supply, fill #0

## 2023-02-02 MED ORDER — AMOXICILLIN-POT CLAVULANATE 500-125 MG PO TABS
1.0000 | ORAL_TABLET | Freq: Two times a day (BID) | ORAL | 0 refills | Status: AC
Start: 2023-02-03 — End: 2023-02-05
  Filled 2023-02-02: qty 4, 2d supply, fill #0

## 2023-02-02 MED ORDER — SIMVASTATIN 5 MG PO TABS: 5.0000 mg | ORAL_TABLET | Freq: Every day | ORAL | Status: DC

## 2023-02-02 NOTE — Discharge Instructions (Signed)
Follow with Primary MD Georganna Skeans, MD in 7 days   Get CBC, CMP, 2 view Chest X ray checked  by Primary MD next visit.    Activity: As tolerated with Full fall precautions use walker/cane & assistance as needed   Disposition Home    Diet: Dysphagia 2 with thin liquid.   On your next visit with your primary care physician please Get Medicines reviewed and adjusted.   Please request your Prim.MD to go over all Hospital Tests and Procedure/Radiological results at the follow up, please get all Hospital records sent to your Prim MD by signing hospital release before you go home.   If you experience worsening of your admission symptoms, develop shortness of breath, life threatening emergency, suicidal or homicidal thoughts you must seek medical attention immediately by calling 911 or calling your MD immediately  if symptoms less severe.  You Must read complete instructions/literature along with all the possible adverse reactions/side effects for all the Medicines you take and that have been prescribed to you. Take any new Medicines after you have completely understood and accpet all the possible adverse reactions/side effects.   Do not drive, operating heavy machinery, perform activities at heights, swimming or participation in water activities or provide baby sitting services if your were admitted for syncope or siezures until you have seen by Primary MD or a Neurologist and advised to do so again.  Do not drive when taking Pain medications.    Do not take more than prescribed Pain, Sleep and Anxiety Medications  Special Instructions: If you have smoked or chewed Tobacco  in the last 2 yrs please stop smoking, stop any regular Alcohol  and or any Recreational drug use.  Wear Seat belts while driving.   Please note  You were cared for by a hospitalist during your hospital stay. If you have any questions about your discharge medications or the care you received while you were in the  hospital after you are discharged, you can call the unit and asked to speak with the hospitalist on call if the hospitalist that took care of you is not available. Once you are discharged, your primary care physician will handle any further medical issues. Please note that NO REFILLS for any discharge medications will be authorized once you are discharged, as it is imperative that you return to your primary care physician (or establish a relationship with a primary care physician if you do not have one) for your aftercare needs so that they can reassess your need for medications and monitor your lab values.

## 2023-02-02 NOTE — Plan of Care (Signed)

## 2023-02-02 NOTE — Discharge Summary (Signed)
Physician Discharge Summary  Claudia Stein FAO:[id number] DOB: 11-Jun-1933 DOA: 01/31/2023  PCP: Georganna Skeans, MD  Admit date: 01/31/2023 Discharge date: 02/02/2023  Admitted From: (Home) Disposition:  (Home)  Recommendations for Outpatient Follow-up:  Follow up with PCP in 1-2 weeks Please obtain BMP/CBC in one week  Home Health: (YES)   Diet recommendation: Dysphagia 2 with thin liquids  Brief/Interim Summary:   89 y.o. female with medical history significant of BRCA in remission, HTN, HLD, hypothyroidism , who presents to ED after eating meal with steak leading to  impacted food bolus. Patient notes she can swallow her saliva but unable to swallow pills or much else.  Was brought to the hospital where she was diagnosed with esophageal stricture with large food, possible aspiration pneumonia, she was seen by GI underwent EGD for dislodgment of food, findings suggestive of possible esophageal candidiasis and benign stricture.   Acute Food impaction s/p removal with associated mild candidal esophagitis - EGD 09/30 grade D esophagitis, candidiasis, benign stricture with food removal, 5 cm HH, nonbleeding diverticulum duodenum.  -Received fluconazole for 100 mg oral once, and to continue 200 mg daily for 13 days for candidal esophagitis, to receive another 11 days on discharge.  GI will arrange for outpatient follow-up for repeat upper endoscopy in 6 weeks to evaluate for grade use vaginitis healing as well as potential dilation. -Will discharge on dysphagia 2 diet with thin liquid.   Aspiration with hypoxemia  -Due to above, she is on room air at time of discharge, treated with Unasyn, will discharge on Augmentin.    BRCA -s/p mastectomy/chemo  in remission   HTN -resume amlodipine and carvediolol     HLD -resume statin    Hypothyroidism -resume synthroid   Discharge Diagnoses:  Principal Problem:   Aspiration into airway Active Problems:   Esophageal obstruction due to food  impaction   Hiatal hernia   Hypoxemia   Acute esophagitis    Discharge Instructions  Discharge Instructions     Diet - low sodium heart healthy   Complete by: As directed    Increase activity slowly   Complete by: As directed       Allergies as of 02/02/2023       Reactions   Haemophilus Influenzae Vaccines Nausea And Vomiting        Medication List     TAKE these medications    amLODipine 10 MG tablet Commonly known as: NORVASC Take 1 tablet by mouth once daily   amoxicillin-clavulanate 500-125 MG tablet Commonly known as: Augmentin Take 1 tablet by mouth 3 (three) times daily for 2 days. Start taking on: February 03, 2023   ascorbic acid 500 MG tablet Commonly known as: VITAMIN C Take 500 mg by mouth daily.   CALCIUM PLUS VITAMIN D PO Take 1 tablet by mouth daily.   carvedilol 12.5 MG tablet Commonly known as: COREG Take 1 tablet (12.5 mg total) by mouth 2 (two) times daily with a meal.   cholecalciferol 25 MCG (1000 UNIT) tablet Commonly known as: VITAMIN D3 Take 1,000 Units by mouth daily.   cyanocobalamin 1000 MCG tablet Commonly known as: VITAMIN B12 Take 1,000 mcg by mouth daily.   fluconazole 200 MG tablet Commonly known as: DIFLUCAN Take 1 tablet (200 mg total) by mouth daily for 11 days. Start taking on: February 03, 2023   latanoprost 0.005 % ophthalmic solution Commonly known as: XALATAN Place 1 drop into both eyes at bedtime.   levothyroxine 75 MCG tablet  Commonly known as: SYNTHROID Take 1 tablet by mouth once daily   pantoprazole 40 MG tablet Commonly known as: PROTONIX Take 1 tablet (40 mg total) by mouth 2 (two) times daily before a meal. What changed:  medication strength how much to take when to take this   PRESERVISION AREDS 2 PO Take 1 tablet by mouth in the morning and at bedtime.   simvastatin 5 MG tablet Commonly known as: ZOCOR Take 1 tablet (5 mg total) by mouth at bedtime. Start taking on: February 16, 2023 What changed: These instructions start on February 16, 2023. If you are unsure what to do until then, ask your doctor or other care provider.        Follow-up Information     [facility] Follow up.   Why: [facility] will contact you within 48hours of DC to arrange a home visit for PT Contact information: [address]  [place]  [address]   [phone]               Allergies  Allergen Reactions   Haemophilus Influenzae Vaccines Nausea And Vomiting    Consultations: GI  Procedures/Studies: Portable chest 1 View  Result Date: 01/31/2023 CLINICAL DATA:  Possible aspiration EXAM: PORTABLE CHEST 1 VIEW COMPARISON:  01/31/2023, CT 09/29/2022, chest x-ray 09/29/2022 FINDINGS: Cardiomegaly. Patchy lower lung opacities. Moderate hiatal hernia. Mild diffuse interstitial opacity. No sizable effusion. No pneumothorax IMPRESSION: 1. Cardiomegaly with mild diffuse interstitial opacity could reflect low-grade edema 2. More patchy airspace opacities at both bases could be due to atelectasis or pneumonia 3. Hiatal hernia Electronically Signed   By: Jasmine Pang M.D.   On: 01/31/2023 23:14   DG CHEST PORT 1 VIEW  Result Date: 01/31/2023 CLINICAL DATA:  Hypoxia and dysphagia EXAM: PORTABLE CHEST 1 VIEW COMPARISON:  09/29/2022 FINDINGS: Cardiac enlargement similar to prior study. Bronchiectasis with bronchial wall thickening consistent with chronic bronchitis. New interstitial and alveolar opacities in the lung bases since prior study, possibly edema or pneumonia. Probable small right pleural effusion. No pneumothorax. Apical pleural calcifications. Calcification of the aorta. Old rib fractures. Surgical clips in the axilla. IMPRESSION: Cardiac enlargement with new pulmonary infiltrates, likely edema or pneumonia. Electronically Signed   By: Burman Nieves M.D.   On: 01/31/2023 19:04      Subjective: No significant events overnight, she denies any complaints, tolerating dysphagia 2  diet.  Discharge Exam: Vitals:   02/02/23 0400 02/02/23 0739  BP: (!) 128/58 (!) 146/87  Pulse: 67 77  Resp: 15 19  Temp: 98.2 F (36.8 C) 98.9 F (37.2 C)  SpO2: 95% 90%   Vitals:   02/02/23 0144 02/02/23 0200 02/02/23 0400 02/02/23 0739  BP: (!) 101/59  (!) 128/58 (!) 146/87  Pulse: 71 69 67 77  Resp: 16 15 15 19   Temp:   98.2 F (36.8 C) 98.9 F (37.2 C)  TempSrc:   Oral Oral  SpO2: 93% 93% 95% 90%  Weight:   54.1 kg   Height:        General: Pt is alert, awake, not in acute distress Cardiovascular: RRR, S1/S2 +, no rubs, no gallops Respiratory: CTA bilaterally, no wheezing, no rhonchi Abdominal: Soft, NT, ND, bowel sounds + Extremities: no edema, no cyanosis    The results of significant diagnostics from this hospitalization (including imaging, microbiology, ancillary and laboratory) are listed below for reference.     Microbiology: No results found for this or any previous visit (from the past 240  hour(s)).   Labs: BNP (last 3 results) Recent Labs    09/29/22 1230 01/31/23 2030 02/02/23 0328  BNP 142.8* 432.5* 203.1*   Basic Metabolic Panel: Recent Labs  Lab 01/31/23 1848 01/31/23 2114 02/01/23 0330 02/02/23 0328  NA  --  138 137 139  K  --  3.4* 3.7 3.9  CL  --  100 106 105  CO2  --  22 26 25   GLUCOSE  --  192* 116* 93  BUN  --  15 17 23   CREATININE 0.84 1.08* 0.84 0.94  CALCIUM  --  9.2 9.0 9.3  MG  --   --   --  1.8   Liver Function Tests: Recent Labs  Lab 01/31/23 2114 02/01/23 0330  AST 23 14*  ALT 13 11  ALKPHOS 90 81  BILITOT 0.9 0.7  PROT 6.3* 6.0*  ALBUMIN 2.7* 2.7*   No results for input(s): "LIPASE", "AMYLASE" in the last 168 hours. No results for input(s): "AMMONIA" in the last 168 hours. CBC: Recent Labs  Lab 01/31/23 1848 01/31/23 2114 02/01/23 0330 02/02/23 0328  WBC 15.2* 15.2* 16.1* 12.8*  NEUTROABS  --  13.7*  --  10.0*  HGB 13.7 12.2 11.8* 11.7*  HCT 40.6 36.6 36.3 35.9*  MCV 88.1 86.7 87.7 87.6  PLT  216 194 185 177   Cardiac Enzymes: No results for input(s): "CKTOTAL", "CKMB", "CKMBINDEX", "TROPONINI" in the last 168 hours. BNP: Invalid input(s): "POCBNP" CBG: No results for input(s): "GLUCAP" in the last 168 hours. D-Dimer No results for input(s): "DDIMER" in the last 72 hours. Hgb A1c Recent Labs    01/31/23 1848 01/31/23 2114  HGBA1C 5.3 5.3   Lipid Profile No results for input(s): "CHOL", "HDL", "LDLCALC", "TRIG", "CHOLHDL", "LDLDIRECT" in the last 72 hours. Thyroid function studies Recent Labs    01/31/23 2114  TSH 1.084   Anemia work up No results for input(s): "VITAMINB12", "FOLATE", "FERRITIN", "TIBC", "IRON", "RETICCTPCT" in the last 72 hours. Urinalysis    Component Value Date/Time   COLORURINE YELLOW 02/01/2023 0925   APPEARANCEUR HAZY (A) 02/01/2023 0925   LABSPEC 1.014 02/01/2023 0925   PHURINE 5.0 02/01/2023 0925   GLUCOSEU NEGATIVE 02/01/2023 0925   HGBUR NEGATIVE 02/01/2023 0925   BILIRUBINUR NEGATIVE 02/01/2023 0925   BILIRUBINUR negative 12/15/2021 1525   KETONESUR NEGATIVE 02/01/2023 0925   PROTEINUR NEGATIVE 02/01/2023 0925   UROBILINOGEN 0.2 12/15/2021 1525   NITRITE NEGATIVE 02/01/2023 0925   LEUKOCYTESUR MODERATE (A) 02/01/2023 0925   Sepsis Labs Recent Labs  Lab 01/31/23 1848 01/31/23 2114 02/01/23 0330 02/02/23 0328  WBC 15.2* 15.2* 16.1* 12.8*   Microbiology No results found for this or any previous visit (from the past 240 hour(s)).   Time coordinating discharge: Over 30 minutes  SIGNED:   Huey Bienenstock, MD  [facility] 02/02/2023, 9:59 AM Pager   If 7PM-7AM, please contact night-coverage [email or web address] Password [email or web address]

## 2023-02-02 NOTE — TOC Transition Note (Signed)
Transition of Care [facility]) - CM/SW Discharge Note   Patient Details  Name: Claudia Stein MRN: [id number] Date of Birth: December 13, 1933  Transition of Care [facility]) CM/SW Contact:  Gordy Clement, RN Phone Number: 02/02/2023, 9:45 AM   Clinical Narrative:     Patient will DC to home today with Family.  HH PT has been recommended for strengthening and will be provided by [facility]. (Patient has used in the past-( [facility]) AVS updated   Family to transport home   No additional TOC needs           Patient Goals and CMS Choice      Discharge Placement                         Discharge Plan and Services Additional resources added to the After Visit Summary for                                       Social Determinants of Health (SDOH) Interventions SDOH Screenings   Food Insecurity: No Food Insecurity (02/01/2023)  Housing: Low Risk  (02/01/2023)  Transportation Needs: No Transportation Needs (02/01/2023)  Utilities: Not At Risk (02/01/2023)  Depression (PHQ2-9): Low Risk  (12/23/2022)  Tobacco Use: Low Risk  (01/31/2023)     Readmission Risk Interventions     No data to display

## 2023-02-02 NOTE — Progress Notes (Signed)
   02/02/23 0943  TOC Brief Assessment  Insurance and Status Reviewed [employer] Medicare)  Patient has primary care physician Yes Georganna Skeans MD)  Home environment has been reviewed from home  Prior level of function: independent  has all DME at home  Prior/Current Home Services No current home services (HAs used [facility] in the pasta)  Social Determinants of Health Reivew SDOH reviewed no interventions necessary  Readmission risk has been reviewed Yes (15%)  Transition of care needs transition of care needs identified, TOC will continue to follow (HHPT has been recommended)

## 2023-02-02 NOTE — Progress Notes (Signed)
Discharge instructions given with stated understanding 

## 2023-02-02 NOTE — Plan of Care (Signed)

## 2023-02-03 ENCOUNTER — Telehealth: Payer: Self-pay | Admitting: Family Medicine

## 2023-02-03 ENCOUNTER — Telehealth: Payer: Self-pay

## 2023-02-03 ENCOUNTER — Other Ambulatory Visit: Payer: Self-pay | Admitting: Internal Medicine

## 2023-02-03 DIAGNOSIS — R1310 Dysphagia, unspecified: Secondary | ICD-10-CM

## 2023-02-03 NOTE — Telephone Encounter (Signed)
Lynette calling from [facility] is calling back to speak to Mitchell.Needing order to add speech therapy to treat and eval to treat to aspirations. All that was ordered was PT. Would like to add speech therapy. In the hospital dx weakness. PT call not open on dx on weakness. Primary dx is acute esophagitis and aspirations and hypoxemia that is why st is need. Cb- [phone] Unable to take a verbal. Can Erskine Squibb put orders in [facility]

## 2023-02-03 NOTE — Transitions of Care (Post Inpatient/ED Visit) (Signed)
   02/03/2023  Name: Claudia Stein MRN: [id number] DOB: 16-Dec-1933  Today's TOC FU Call Status: Today's TOC FU Call Status:: Successful TOC FU Call Completed TOC FU Call Complete Date: 02/03/23 Patient's Name and Date of Birth confirmed.  Transition Care Management Follow-up Telephone Call Date of Discharge: 02/02/23 Discharge Facility: [facility]) Type of Discharge: Inpatient Admission Primary Inpatient Discharge Diagnosis:: aspiration How have you been since you were released from the hospital?: Better Any questions or concerns?: No  Items Reviewed: Did you receive and understand the discharge instructions provided?: Yes Medications obtained,verified, and reconciled?: Partial Review Completed Reason for Partial Mediation Review: She said she has all of her medications and did not have any questions about the med regime.  She did not have the med list with her at the time of this call. Any new allergies since your discharge?: No Dietary orders reviewed?: Yes Type of Diet Ordered:: heart healthy, low sodium.  She stated she eats very little; but this is noting new Do you have support at home?: Yes People in Home: child(ren), adult Name of Support/Comfort Primary Source: She is currently staying with her son and daughter in law for 2 days and will then return to her own home.  Medications Reviewed Today: Medications Reviewed Today   Medications were not reviewed in this encounter     Home Care and Equipment/Supplies: Were Home Health Services Ordered?: Yes Name of Home Health Agency[facility] Has Agency set up a time to come to your home?: Yes First Home Health Visit Date: 02/05/23 Any new equipment or medical supplies ordered?: No  Functional Questionnaire: Do you need assistance with bathing/showering or dressing?: No Do you need assistance with meal preparation?: Yes (her daughter in law prepares meals,) Do you need assistance with eating?: No Do you have difficulty  maintaining continence: No Do you need assistance with getting out of bed/getting out of a chair/moving?: Yes (She ambulates with a cane) Do you have difficulty managing or taking your medications?: No  Follow up appointments reviewed: PCP Follow-up appointment confirmed?: Yes Date of PCP follow-up appointment?: 02/04/23 Follow-up Provider: Dr Andrey Campanile.  the patient wanted an appointment within 2 weeks and this was the only available appt in that time frame. Specialist Hospital Follow-up appointment confirmed?: Yes Date of Specialist follow-up appointment?: 03/21/23 Follow-Up Specialty Provider:: GI Do you need transportation to your follow-up appointment?: No Do you understand care options if your condition(s) worsen?: Yes-patient verbalized understanding    SIGNATURE Robyne Peers, RN

## 2023-02-03 NOTE — Telephone Encounter (Signed)
Please advise 

## 2023-02-03 NOTE — Telephone Encounter (Signed)
Copied from CRM [id number]. Topic: General - Other >> Feb 03, 2023 12:25 PM Turkey B wrote: Reason for CRM: Haywood Lasso from [facility] called in to speak with Erskine Squibb B, about pt who was not coded correctly in the hosp for Speech Therapy. I put in the request for home hlth for this, but not sure if therapy can still be done with coding of just weakness from the hosp. Please cb to discuss

## 2023-02-03 NOTE — Telephone Encounter (Signed)
Dr Andrey Campanile,   I spoke to Lynette/ [facility] and she explained that home PT was ordered when the patient left the hospital but they only have a diagnosis  of weakness and that is not accepted by her insurance as an appropriate diagnosis for  home health.  Haywood Lasso is asking if there is another diagnosis that can be added to support the need for PT.   She was admitted to the hospital with aspiration and had an ST eval.  If she needs ST, they can add to the referral if you are in agreement They did order ST when she was discharged and the ST eval stated  No SLP follow up recommendations.   She has an appt with you tomorrow- 10/4.  If you think she can benefit from ST and/or there is another diagnosis for PT we can contact Audrea Muscat / [facility] with the information: [phone].  Haywood Lasso said they will not be able to see the patient at home until the orders are clarified.

## 2023-02-03 NOTE — Telephone Encounter (Signed)
Home Health Verbal Orders - Caller/Agency: Lynette from [facility] Callback Number: [phone] Requesting :Speech Therapy Frequency: eval and treat due to aspiration causing hpoxemia

## 2023-02-04 ENCOUNTER — Ambulatory Visit (INDEPENDENT_AMBULATORY_CARE_PROVIDER_SITE_OTHER): Payer: Medicare Other | Admitting: Family Medicine

## 2023-02-04 VITALS — BP 161/65 | HR 68 | Temp 98.1°F | Resp 16 | Wt 114.0 lb

## 2023-02-04 DIAGNOSIS — R1310 Dysphagia, unspecified: Secondary | ICD-10-CM

## 2023-02-04 DIAGNOSIS — I10 Essential (primary) hypertension: Secondary | ICD-10-CM

## 2023-02-04 DIAGNOSIS — R2681 Unsteadiness on feet: Secondary | ICD-10-CM

## 2023-02-04 DIAGNOSIS — Z09 Encounter for follow-up examination after completed treatment for conditions other than malignant neoplasm: Secondary | ICD-10-CM

## 2023-02-04 MED ORDER — CARVEDILOL 25 MG PO TABS: 25.0000 mg | ORAL_TABLET | Freq: Two times a day (BID) | ORAL | 1 refills | Status: DC

## 2023-02-04 NOTE — Progress Notes (Unsigned)
Established Patient Office Visit  Subjective    Patient ID: Claudia Stein, female    DOB: [birth year]  Age: 89 y.o. MRN: [id number]  CC:  Chief Complaint  Patient presents with   Medical Management of Chronic Issues    HPI MALYNA BUDNEY presents for hospital discharge for where she was seen for dysphagia with food impaction.   Outpatient Encounter Medications as of 02/04/2023  Medication Sig   amLODipine (NORVASC) 10 MG tablet Take 1 tablet by mouth once daily   amoxicillin-clavulanate (AUGMENTIN) 500-125 MG tablet Take 1 tablet by mouth 2 (two) times daily for 2 days.   Calcium Carbonate-Vitamin D (CALCIUM PLUS VITAMIN D PO) Take 1 tablet by mouth daily.     carvedilol (COREG) 12.5 MG tablet Take 1 tablet (12.5 mg total) by mouth 2 (two) times daily with a meal.   cholecalciferol (VITAMIN D3) 25 MCG (1000 UNIT) tablet Take 1,000 Units by mouth daily.   fluconazole (DIFLUCAN) 200 MG tablet Take 1 tablet (200 mg total) by mouth daily for 11 days.   latanoprost (XALATAN) 0.005 % ophthalmic solution Place 1 drop into both eyes at bedtime.    levothyroxine (SYNTHROID) 75 MCG tablet Take 1 tablet by mouth once daily   Multiple Vitamins-Minerals (PRESERVISION AREDS 2 PO) Take 1 tablet by mouth in the morning and at bedtime.    pantoprazole (PROTONIX) 40 MG tablet Take 1 tablet (40 mg total) by mouth 2 (two) times daily before a meal.   [START ON 02/16/2023] simvastatin (ZOCOR) 5 MG tablet Take 1 tablet (5 mg total) by mouth at bedtime.   vitamin B-12 (CYANOCOBALAMIN) 1000 MCG tablet Take 1,000 mcg by mouth daily.     vitamin C (ASCORBIC ACID) 500 MG tablet Take 500 mg by mouth daily.     No facility-administered encounter medications on file as of 02/04/2023.    Past Medical History:  Diagnosis Date   Bruises easily    Cancer (HCC) 04-16-09, 2012   Breast- bilateral   Hyperlipidemia    Hypertension    PONV (postoperative nausea and vomiting)    Thyroid disease     Past Surgical  History:  Procedure Laterality Date   APPENDECTOMY     BREAST SURGERY  04-16-09   left  Lumpectomy   ESOPHAGOGASTRODUODENOSCOPY (EGD) WITH PROPOFOL N/A 01/31/2023   Procedure: ESOPHAGOGASTRODUODENOSCOPY (EGD) WITH PROPOFOL;  Surgeon: Beverley Fiedler, MD;  Location: [facility] ENDOSCOPY;  Service: Gastroenterology;  Laterality: N/A;   FOREIGN BODY REMOVAL  01/31/2023   Procedure: FOREIGN BODY REMOVAL;  Surgeon: Beverley Fiedler, MD;  Location[facility] ENDOSCOPY;  Service: Gastroenterology;;   FRACTURE SURGERY     broke right ankle in the 80's   MASTECTOMY  November 2011   right breast   MASTECTOMY W/ SENTINEL NODE BIOPSY  03/22/2011   Procedure: MASTECTOMY WITH SENTINEL LYMPH NODE BIOPSY;  Surgeon: Kandis Cocking, MD;  Location: [facility] OR;  Service: General;  Laterality: Left;  left mastectomy, sential lymph node biopsy, left axilla    Family History  Problem Relation Age of Onset   Heart disease Mother 97   Cancer Paternal Aunt        colon    Social History   Socioeconomic History   Marital status: Widowed    Spouse name: Not on file   Number of children: Not on file   Years of education: Not on file   Highest education level: Not on file  Occupational History   Not on file  Tobacco Use   Smoking status: Never    Passive exposure: Never   Smokeless tobacco: Never  Substance and Sexual Activity   Alcohol use: No   Drug use: No   Sexual activity: Not Currently  Other Topics Concern   Not on file  Social History Narrative   Not on file   Social Determinants of Health   Financial Resource Strain: Not on file  Food Insecurity: No Food Insecurity (02/01/2023)   Hunger Vital Sign    Worried About Running Out of Food in the Last Year: Never true    Ran Out of Food in the Last Year: Never true  Transportation Needs: No Transportation Needs (02/01/2023)   PRAPARE - Administrator, Civil Service (Medical): No    Lack of Transportation (Non-Medical): No  Physical Activity: Not on file   Stress: Not on file  Social Connections: Not on file  Intimate Partner Violence: Not At Risk (02/01/2023)   Humiliation, Afraid, Rape, and Kick questionnaire    Fear of Current or Ex-Partner: No    Emotionally Abused: No    Physically Abused: No    Sexually Abused: No    ROS      Objective    BP (!) 161/65   Pulse 68   Temp 98.1 F (36.7 C) (Oral)   Resp 16   Wt 114 lb (51.7 kg)   SpO2 94%   BMI 24.67 kg/m   Physical Exam  {Labs (Optional):23779}    Assessment & Plan:   There are no diagnoses linked to this encounter.   No follow-ups on file.   Tommie Raymond, MD

## 2023-02-04 NOTE — Telephone Encounter (Signed)
Need  DX other than weakness

## 2023-02-04 NOTE — Telephone Encounter (Signed)
Office visit with provider on 02/04/2023

## 2023-02-05 LAB — BASIC METABOLIC PANEL
BUN/Creatinine Ratio: 19 (ref 12–28)
BUN: 16 mg/dL (ref 8–27)
CO2: 23 mmol/L (ref 20–29)
Calcium: 9.5 mg/dL (ref 8.7–10.3)
Chloride: 104 mmol/L (ref 96–106)
Creatinine, Ser: 0.83 mg/dL (ref 0.57–1.00)
Glucose: 94 mg/dL (ref 70–99)
Potassium: 4.4 mmol/L (ref 3.5–5.2)
Sodium: 140 mmol/L (ref 134–144)
eGFR: 69 mL/min/{1.73_m2} (ref >59)

## 2023-02-05 LAB — CBC WITH DIFFERENTIAL/PLATELET
Basophils Absolute: 0.1 10*3/uL (ref 0.0–0.2)
Basos: 1 %
EOS (ABSOLUTE): 0.1 10*3/uL (ref 0.0–0.4)
Eos: 1 %
Hematocrit: 39 % (ref 34.0–46.6)
Hemoglobin: 12.7 g/dL (ref 11.1–15.9)
Immature Grans (Abs): 0.1 10*3/uL (ref 0.0–0.1)
Immature Granulocytes: 1 %
Lymphocytes Absolute: 1.8 10*3/uL (ref 0.7–3.1)
Lymphs: 18 %
MCH: 29.1 pg (ref 26.6–33.0)
MCHC: 32.6 g/dL (ref 31.5–35.7)
MCV: 89 fL (ref 79–97)
Monocytes Absolute: 1 10*3/uL — ABNORMAL HIGH (ref 0.1–0.9)
Monocytes: 10 %
Neutrophils Absolute: 6.8 10*3/uL (ref 1.4–7.0)
Neutrophils: 69 %
Platelets: 245 10*3/uL (ref 150–450)
RBC: 4.36 x10E6/uL (ref 3.77–5.28)
RDW: 13.1 % (ref 11.7–15.4)
WBC: 9.8 10*3/uL (ref 3.4–10.8)

## 2023-02-08 ENCOUNTER — Encounter: Payer: Self-pay | Admitting: Family Medicine

## 2023-02-08 ENCOUNTER — Telehealth: Payer: Self-pay | Admitting: Family Medicine

## 2023-02-08 NOTE — Telephone Encounter (Signed)
Lynette with [facility] is calling in requesting that Erskine Squibb give her a call regarding this patient. Please follow up with Lynette.

## 2023-02-08 NOTE — Telephone Encounter (Signed)
I spoke to Lynette/ [facility] and informed her that per note by Dr Andrey Campanile on 02/04/2023: Patient defers having home health services per OV today   Haywood Lasso said she understood and would cancel referral.

## 2023-03-05 ENCOUNTER — Other Ambulatory Visit: Payer: Self-pay | Admitting: Family Medicine

## 2023-03-06 ENCOUNTER — Other Ambulatory Visit: Payer: Self-pay | Admitting: Family Medicine

## 2023-03-09 ENCOUNTER — Other Ambulatory Visit: Payer: Self-pay | Admitting: Family Medicine

## 2023-03-20 ENCOUNTER — Other Ambulatory Visit: Payer: Self-pay | Admitting: Family Medicine

## 2023-03-21 ENCOUNTER — Ambulatory Visit: Payer: Medicare Other | Admitting: Internal Medicine

## 2023-03-21 ENCOUNTER — Encounter: Payer: Self-pay | Admitting: Internal Medicine

## 2023-03-21 VITALS — BP 130/60 | HR 55 | Ht <= 58 in | Wt 95.0 lb

## 2023-03-21 DIAGNOSIS — R634 Abnormal weight loss: Secondary | ICD-10-CM | POA: Diagnosis not present

## 2023-03-21 DIAGNOSIS — K2100 Gastro-esophageal reflux disease with esophagitis, without bleeding: Secondary | ICD-10-CM

## 2023-03-21 DIAGNOSIS — K449 Diaphragmatic hernia without obstruction or gangrene: Secondary | ICD-10-CM | POA: Diagnosis not present

## 2023-03-21 DIAGNOSIS — K222 Esophageal obstruction: Secondary | ICD-10-CM

## 2023-03-21 MED ORDER — PANTOPRAZOLE SODIUM 40 MG PO TBEC: 40.0000 mg | DELAYED_RELEASE_TABLET | Freq: Two times a day (BID) | ORAL | 1 refills | Status: DC

## 2023-03-21 NOTE — Progress Notes (Signed)
Patient ID: Claudia Stein, female   DOB: [birth year], 89 y.o.   MRN: [id number] HPI: Discussed the use of AI scribe software for clinical note transcription with the patient, who gave verbal consent to proceed.  Claudia Stein, an 89 year old female with a history of breast cancer and hiatal hernia, was seen in hospital consultation following an esophageal food impaction in late Sept 2024. She underwent an EGD which revealed diffuse candidiasis and food in the middle and lower third of the esophagus, which was removed with rapture grasper and net. The procedure also identified LA grade D esophagitis in the lower third of the esophagus and a moderate stricture at 35 centimeters. A 5 cm hiatal hernia was also noted.  She is here today with her daughter-in-law.  The patient reports improved swallowing since the procedure, attributing this to careful food preparation and thorough chewing. She denies any vomiting or nausea since the night before her hospital admission. However, she has experienced significant weight loss, approximately 20 pounds, over the last 3-4 months.  Her food intake is perceived to be low, with difficulty swallowing certain foods such as hot dogs and steak. She has been able to tolerate chicken, ground beef, and ground [place]. She also consumes a Boost supplement once daily, but reports gastrointestinal discomfort with a second serving.  The patient's family reports that she eats slowly, which may contribute to her reduced food intake.   Past Medical History:  Diagnosis Date   Bruises easily    Cancer (HCC) 04-16-09, 2012   Breast- bilateral   Hyperlipidemia    Hypertension    PONV (postoperative nausea and vomiting)    Thyroid disease     Past Surgical History:  Procedure Laterality Date   APPENDECTOMY     BREAST SURGERY  04-16-09   left  Lumpectomy   ESOPHAGOGASTRODUODENOSCOPY (EGD) WITH PROPOFOL N/A 01/31/2023   Procedure: ESOPHAGOGASTRODUODENOSCOPY (EGD) WITH PROPOFOL;   Surgeon: Beverley Fiedler, MD;  Location: [facility] ENDOSCOPY;  Service: Gastroenterology;  Laterality: N/A;   FOREIGN BODY REMOVAL  01/31/2023   Procedure: FOREIGN BODY REMOVAL;  Surgeon: Beverley Fiedler, MD;  Location: [facility] ENDOSCOPY;  Service: Gastroenterology;;   FRACTURE SURGERY     broke right ankle in the 80's   MASTECTOMY  November 2011   right breast   MASTECTOMY W/ SENTINEL NODE BIOPSY  03/22/2011   Procedure: MASTECTOMY WITH SENTINEL LYMPH NODE BIOPSY;  Surgeon: Kandis Cocking, MD;  Location: [facility] OR;  Service: General;  Laterality: Left;  left mastectomy, sential lymph node biopsy, left axilla    Outpatient Medications Prior to Visit  Medication Sig Dispense Refill   amLODipine (NORVASC) 10 MG tablet Take 1 tablet by mouth once daily 90 tablet 0   Calcium Carbonate-Vitamin D (CALCIUM PLUS VITAMIN D PO) Take 1 tablet by mouth daily.       carvedilol (COREG) 12.5 MG tablet TAKE 1 TABLET BY MOUTH TWICE DAILY WITH A MEAL 180 tablet 0   cholecalciferol (VITAMIN D3) 25 MCG (1000 UNIT) tablet Take 1,000 Units by mouth daily.     latanoprost (XALATAN) 0.005 % ophthalmic solution Place 1 drop into both eyes at bedtime.      levothyroxine (SYNTHROID) 75 MCG tablet Take 1 tablet by mouth once daily 90 tablet 0   Multiple Vitamins-Minerals (PRESERVISION AREDS 2 PO) Take 1 tablet by mouth in the morning and at bedtime.      simvastatin (ZOCOR) 5 MG tablet TAKE 1 TABLET BY MOUTH AT BEDTIME 90 tablet 0  vitamin B-12 (CYANOCOBALAMIN) 1000 MCG tablet Take 1,000 mcg by mouth daily.       vitamin C (ASCORBIC ACID) 500 MG tablet Take 500 mg by mouth daily.       pantoprazole (PROTONIX) 40 MG tablet Take 1 tablet (40 mg total) by mouth 2 (two) times daily before a meal. 60 tablet 0   carvedilol (COREG) 25 MG tablet Take 1 tablet (25 mg total) by mouth 2 (two) times daily with a meal. 180 tablet 1   No facility-administered medications prior to visit.    Allergies  Allergen Reactions   Haemophilus Influenzae  Vaccines Nausea And Vomiting    Family History  Problem Relation Age of Onset   Heart disease Mother 44   Cancer Paternal Aunt        colon    Social History   Tobacco Use   Smoking status: Never    Passive exposure: Never   Smokeless tobacco: Never  Substance Use Topics   Alcohol use: No   Drug use: No    ROS: As per history of present illness, otherwise negative  BP 130/60 (BP Location: Left Arm, Patient Position: Sitting, Cuff Size: Normal)   Pulse (!) 55   Ht 4\' 9"  (1.448 m)   Wt 95 lb (43.1 kg)   SpO2 95%   BMI 20.56 kg/m  Gen: awake, alert, NAD HEENT: anicteric  Abd: soft, NT/ND, +BS throughout Ext: no c/c/e Neuro: nonfocal   RELEVANT LABS AND IMAGING: CBC    Component Value Date/Time   WBC 9.8 02/04/2023 1035   WBC 12.8 (H) 02/02/2023 0328   RBC 4.36 02/04/2023 1035   RBC 4.10 02/02/2023 0328   HGB 12.7 02/04/2023 1035   HGB 13.3 02/12/2014 1211   HCT 39.0 02/04/2023 1035   HCT 40.2 02/12/2014 1211   PLT 245 02/04/2023 1035   MCV 89 02/04/2023 1035   MCV 88.7 02/12/2014 1211   MCH 29.1 02/04/2023 1035   MCH 28.5 02/02/2023 0328   MCHC 32.6 02/04/2023 1035   MCHC 32.6 02/02/2023 0328   RDW 13.1 02/04/2023 1035   RDW 13.2 02/12/2014 1211   LYMPHSABS 1.8 02/04/2023 1035   LYMPHSABS 1.9 02/12/2014 1211   MONOABS 0.9 02/02/2023 0328   MONOABS 0.4 02/12/2014 1211   EOSABS 0.1 02/04/2023 1035   BASOSABS 0.1 02/04/2023 1035   BASOSABS 0.0 02/12/2014 1211    CMP     Component Value Date/Time   NA 140 02/04/2023 1035   NA 141 02/12/2014 1211   K 4.4 02/04/2023 1035   K 3.8 02/12/2014 1211   CL 104 02/04/2023 1035   CL 102 01/27/2012 1051   CO2 23 02/04/2023 1035   CO2 31 (H) 02/12/2014 1211   GLUCOSE 94 02/04/2023 1035   GLUCOSE 93 02/02/2023 0328   GLUCOSE 84 02/12/2014 1211   GLUCOSE 83 01/27/2012 1051   BUN 16 02/04/2023 1035   BUN 8.1 02/12/2014 1211   CREATININE 0.83 02/04/2023 1035   CREATININE 1.0 02/12/2014 1211   CALCIUM 9.5  02/04/2023 1035   CALCIUM 9.8 02/12/2014 1211   PROT 6.0 (L) 02/01/2023 0330   PROT 7.2 07/28/2022 0934   PROT 7.3 02/12/2014 1211   ALBUMIN 2.7 (L) 02/01/2023 0330   ALBUMIN 3.9 07/28/2022 0934   ALBUMIN 3.6 02/12/2014 1211   AST 14 (L) 02/01/2023 0330   AST 16 02/12/2014 1211   ALT 11 02/01/2023 0330   ALT 17 02/12/2014 1211   ALKPHOS 81 02/01/2023 0330   ALKPHOS  95 02/12/2014 1211   BILITOT 0.7 02/01/2023 0330   BILITOT 0.4 07/28/2022 0934   BILITOT 0.46 02/12/2014 1211   GFRNONAA 59 (L) 02/02/2023 0328   GFRAA >60 01/03/2020 1235    Results     EGD: Diffuse candidiasis with food in the middle and lower third of the esophagus removed by Raptor, Grasper, and Rothnet. LA grade D esophagitis in the lower third of the esophagus. Moderate stricture at 35 cm measuring 1 cm in diameter by 1 cm in length. 5 cm hiatal hernia. Normal stomach. Large non-bleeding diverticulum in the second portion of the duodenum. (01/31/2023)     ASSESSMENT/PLAN: Esophageal Stricture Improved swallowing after EGD on 01/31/2023 with removal of food impaction and treatment of candidiasis. Noted moderate stricture at 35 cm, LA grade D esophagitis, and hiatal hernia. Patient has lost significant weight, possibly due to difficulty swallowing certain foods. -Schedule repeat EGD on 04/08/2023 to assess healing and potentially dilate stricture. -Continue Pantoprazole twice daily until follow-up EGD. -The nature of the procedure, as well as the risks, benefits, and alternatives were carefully and thoroughly reviewed with the patient. Ample time for discussion and questions allowed. The patient understood, was satisfied, and agreed to proceed.    Weight Loss Significant weight loss since hospitalization, possibly due to difficulty swallowing certain foods. -Encourage continued use of meal supplements as tolerated. -Reassess after potential esophageal dilation.  Hiatal Hernia Contributing to acid reflux and  esophagitis. -Continue Pantoprazole twice daily until follow-up EGD    FA:[id number], Lauris Poag, Md [address] [place],  [place] [address]  30 minutes total spent today including patient facing time, coordination of care, reviewing medical history/procedures/pertinent radiology studies, and documentation of the encounter.

## 2023-03-21 NOTE — Patient Instructions (Addendum)
We have sent the following medications to your pharmacy for you to pick up at your convenience:  Pantoprazole   You have been scheduled for an endoscopy. Please follow written instructions given to you at your visit today.  If you use inhalers (even only as needed), please bring them with you on the day of your procedure.  If you take any of the following medications, they will need to be adjusted prior to your procedure:   DO NOT TAKE 7 DAYS PRIOR TO TEST- Trulicity (dulaglutide) Ozempic, Wegovy (semaglutide) Mounjaro (tirzepatide) Bydureon Bcise (exanatide extended release)  DO NOT TAKE 1 DAY PRIOR TO YOUR TEST Rybelsus (semaglutide) Adlyxin (lixisenatide) Victoza (liraglutide) Byetta (exanatide) ___________________________________________________________________________   _______________________________________________________  If your blood pressure at your visit was 140/90 or greater, please contact your primary care physician to follow up on this.  _______________________________________________________  If you are age 84 or older, your body mass index should be between 23-30. Your Body mass index is 20.56 kg/m. If this is out of the aforementioned range listed, please consider follow up with your Primary Care Provider.  If you are age 87 or younger, your body mass index should be between 19-25. Your Body mass index is 20.56 kg/m. If this is out of the aformentioned range listed, please consider follow up with your Primary Care Provider.   ________________________________________________________  The [facility] providers would like to encourage you to use [facility] to communicate with providers for non-urgent requests or questions.  Due to long hold times on the telephone, sending your provider a message by [facility] may be a faster and more efficient way to get a response.  Please allow 48 business hours for a response.  Please remember that this is for non-urgent requests.   _______________________________________________________

## 2023-03-25 ENCOUNTER — Ambulatory Visit (INDEPENDENT_AMBULATORY_CARE_PROVIDER_SITE_OTHER): Payer: Medicare Other | Admitting: Family Medicine

## 2023-03-25 ENCOUNTER — Encounter: Payer: Self-pay | Admitting: Family Medicine

## 2023-03-25 VITALS — BP 146/89 | HR 62 | Temp 97.6°F | Resp 16 | Wt 115.0 lb

## 2023-03-25 DIAGNOSIS — R2681 Unsteadiness on feet: Secondary | ICD-10-CM

## 2023-03-25 DIAGNOSIS — I10 Essential (primary) hypertension: Secondary | ICD-10-CM

## 2023-03-25 DIAGNOSIS — K449 Diaphragmatic hernia without obstruction or gangrene: Secondary | ICD-10-CM | POA: Diagnosis not present

## 2023-03-25 DIAGNOSIS — K222 Esophageal obstruction: Secondary | ICD-10-CM

## 2023-03-25 DIAGNOSIS — R634 Abnormal weight loss: Secondary | ICD-10-CM

## 2023-03-25 NOTE — Progress Notes (Unsigned)
Established Patient Office Visit  Subjective    Patient ID: Claudia Stein, female    DOB: 1933-12-25  Age: 89 y.o. MRN: [id number]  CC:  Chief Complaint  Patient presents with   Medical Management of Chronic Issues    HPI Claudia Stein presents for follow up of hypertension. Patient reports some continued gait problems. Patient reports that she will be seeing consultant for procedure as she has an esophageal stricture that they will be dilating. She also has an hiatal hernia.   Outpatient Encounter Medications as of 03/25/2023  Medication Sig   amLODipine (NORVASC) 10 MG tablet Take 1 tablet by mouth once daily   Calcium Carbonate-Vitamin D (CALCIUM PLUS VITAMIN D PO) Take 1 tablet by mouth daily.     carvedilol (COREG) 12.5 MG tablet TAKE 1 TABLET BY MOUTH TWICE DAILY WITH A MEAL   cholecalciferol (VITAMIN D3) 25 MCG (1000 UNIT) tablet Take 1,000 Units by mouth daily.   latanoprost (XALATAN) 0.005 % ophthalmic solution Place 1 drop into both eyes at bedtime.    levothyroxine (SYNTHROID) 75 MCG tablet Take 1 tablet by mouth once daily   Multiple Vitamins-Minerals (PRESERVISION AREDS 2 PO) Take 1 tablet by mouth in the morning and at bedtime.    pantoprazole (PROTONIX) 40 MG tablet Take 1 tablet (40 mg total) by mouth 2 (two) times daily before a meal.   simvastatin (ZOCOR) 5 MG tablet TAKE 1 TABLET BY MOUTH AT BEDTIME   vitamin B-12 (CYANOCOBALAMIN) 1000 MCG tablet Take 1,000 mcg by mouth daily.     vitamin C (ASCORBIC ACID) 500 MG tablet Take 500 mg by mouth daily.     No facility-administered encounter medications on file as of 03/25/2023.    Past Medical History:  Diagnosis Date   Bruises easily    Cancer (HCC) 04-16-09, 2012   Breast- bilateral   Hyperlipidemia    Hypertension    PONV (postoperative nausea and vomiting)    Thyroid disease     Past Surgical History:  Procedure Laterality Date   APPENDECTOMY     BREAST SURGERY  04-16-09   left  Lumpectomy    ESOPHAGOGASTRODUODENOSCOPY (EGD) WITH PROPOFOL N/A 01/31/2023   Procedure: ESOPHAGOGASTRODUODENOSCOPY (EGD) WITH PROPOFOL;  Surgeon: Beverley Fiedler, MD;  Location: [facility] ENDOSCOPY;  Service: Gastroenterology;  Laterality: N/A;   FOREIGN BODY REMOVAL  01/31/2023   Procedure: FOREIGN BODY REMOVAL;  Surgeon: Beverley Fiedler, MD;  Location: [facility] ENDOSCOPY;  Service: Gastroenterology;;   FRACTURE SURGERY     broke right ankle in the 80's   MASTECTOMY  November 2011   right breast   MASTECTOMY W/ SENTINEL NODE BIOPSY  03/22/2011   Procedure: MASTECTOMY WITH SENTINEL LYMPH NODE BIOPSY;  Surgeon: Kandis Cocking, MD;  Location: [facility] OR;  Service: General;  Laterality: Left;  left mastectomy, sential lymph node biopsy, left axilla    Family History  Problem Relation Age of Onset   Heart disease Mother 85   Cancer Paternal Aunt        colon    Social History   Socioeconomic History   Marital status: Widowed    Spouse name: Not on file   Number of children: Not on file   Years of education: Not on file   Highest education level: Not on file  Occupational History   Not on file  Tobacco Use   Smoking status: Never    Passive exposure: Never   Smokeless tobacco: Never  Substance and Sexual Activity  Alcohol use: No   Drug use: No   Sexual activity: Not Currently  Other Topics Concern   Not on file  Social History Narrative   Not on file   Social Determinants of Health   Financial Resource Strain: Not on file  Food Insecurity: No Food Insecurity (02/01/2023)   Hunger Vital Sign    Worried About Running Out of Food in the Last Year: Never true    Ran Out of Food in the Last Year: Never true  Transportation Needs: No Transportation Needs (02/01/2023)   PRAPARE - Administrator, Civil Service (Medical): No    Lack of Transportation (Non-Medical): No  Physical Activity: Not on file  Stress: Not on file  Social Connections: Not on file  Intimate Partner Violence: Not At Risk  (02/01/2023)   Humiliation, Afraid, Rape, and Kick questionnaire    Fear of Current or Ex-Partner: No    Emotionally Abused: No    Physically Abused: No    Sexually Abused: No    Review of Systems  Musculoskeletal:  Negative for falls.  All other systems reviewed and are negative.       Objective    BP (!) 146/89 (BP Location: Right Arm, Patient Position: Sitting, Cuff Size: Small)   Pulse 62   Temp 97.6 F (36.4 C) (Oral)   Resp 16   Wt 115 lb (52.2 kg)   SpO2 95%   BMI 24.89 kg/m   Physical Exam Vitals and nursing note reviewed.  Constitutional:      General: She is not in acute distress. HENT:     Head: Normocephalic.  Cardiovascular:     Rate and Rhythm: Normal rate and regular rhythm.  Pulmonary:     Effort: Pulmonary effort is normal.     Breath sounds: Normal breath sounds.  Abdominal:     Palpations: Abdomen is soft.     Tenderness: There is no abdominal tenderness.  Neurological:     General: No focal deficit present.     Mental Status: She is alert and oriented to person, place, and time.         Assessment & Plan:   1. Essential hypertension Slightly increased readings. Will monitor  2. Gait instability Patient defers further management at this time  3. Esophageal stricture Management as per consultant  4. Hiatal hernia As above  5. Loss of weight Weight remains stable at this time   Return in about 3 months (around 06/25/2023) for follow up.   Tommie Raymond, MD

## 2023-03-29 ENCOUNTER — Encounter: Payer: Self-pay | Admitting: Family Medicine

## 2023-04-08 ENCOUNTER — Encounter: Payer: Self-pay | Admitting: Internal Medicine

## 2023-04-08 ENCOUNTER — Ambulatory Visit (AMBULATORY_SURGERY_CENTER): Payer: Medicare Other | Admitting: Internal Medicine

## 2023-04-08 VITALS — BP 124/66 | HR 55 | Temp 98.4°F | Resp 12 | Ht <= 58 in | Wt 95.0 lb

## 2023-04-08 DIAGNOSIS — K222 Esophageal obstruction: Secondary | ICD-10-CM

## 2023-04-08 DIAGNOSIS — K2100 Gastro-esophageal reflux disease with esophagitis, without bleeding: Secondary | ICD-10-CM | POA: Diagnosis not present

## 2023-04-08 DIAGNOSIS — K5710 Diverticulosis of small intestine without perforation or abscess without bleeding: Secondary | ICD-10-CM | POA: Diagnosis not present

## 2023-04-08 DIAGNOSIS — K449 Diaphragmatic hernia without obstruction or gangrene: Secondary | ICD-10-CM | POA: Diagnosis not present

## 2023-04-08 DIAGNOSIS — Q399 Congenital malformation of esophagus, unspecified: Secondary | ICD-10-CM | POA: Diagnosis not present

## 2023-04-08 MED ORDER — SODIUM CHLORIDE 0.9 % IV SOLN: 500.0000 mL | INTRAVENOUS | Status: DC

## 2023-04-08 NOTE — Op Note (Signed)
[facility] Patient Name: Claudia Stein Procedure Date: 04/08/2023 10:10 AM MRN: [id number] Endoscopist: Beverley Fiedler , MD, [id number] Age: 89 Referring MD:  Date of Birth: 08-06-1933 Gender: Female Account #: [id number] Procedure:                Upper GI endoscopy Indications:              Follow-up of reflux esophagitis (LA Grade D seen on                            01/31/23 at time of food impaction), For therapy of                            esophageal stenosis; subjective improvement in                            swallowing with PPI Medicines:                Monitored Anesthesia Care Procedure:                Pre-Anesthesia Assessment:                           - Prior to the procedure, a History and Physical                            was performed, and patient medications and                            allergies were reviewed. The patient's tolerance of                            previous anesthesia was also reviewed. The risks                            and benefits of the procedure and the sedation                            options and risks were discussed with the patient.                            All questions were answered, and informed consent                            was obtained. Prior Anticoagulants: The patient has                            taken no anticoagulant or antiplatelet agents. ASA                            Grade Assessment: III - A patient with severe                            systemic disease. After reviewing the risks and  benefits, the patient was deemed in satisfactory                            condition to undergo the procedure.                           After obtaining informed consent, the endoscope was                            passed under direct vision. Throughout the                            procedure, the patient's blood pressure, pulse, and                            oxygen saturations were monitored  continuously. The                            Olympus scope [id number] was introduced through the                            mouth, and advanced to the second part of duodenum.                            The upper GI endoscopy was accomplished without                            difficulty. The patient tolerated the procedure                            well. Scope In: Scope Out: Findings:                 The middle third of the esophagus and lower third                            of the esophagus were significantly tortuous.                           One benign-appearing, intrinsic moderate                            (circumferential scarring or stenosis; an endoscope                            may pass) stenosis was found 32 cm from the                            incisors. This stenosis measured 1.2 cm (inner                            diameter) x less than one cm (in length). The                            stenosis was traversed.  A TTS dilator was passed                            through the scope. Dilation with a 12-13.5-15 mm                            balloon dilator was performed to 15 mm. The                            dilation site was examined and showed moderate                            mucosal disruption.                           A large hiatal hernia was present.                           The gastroesophageal flap valve was visualized                            endoscopically and classified as Hill Grade IV (no                            fold, wide open lumen, hiatal hernia present).                           The entire examined stomach was normal.                           A large diverticulum was found in the second                            portion of the duodenum.                           The exam of the duodenum was otherwise normal. Complications:            No immediate complications. Estimated Blood Loss:     Estimated blood loss was minimal. Impression:                - Tortuous esophagus.                           - Benign-appearing esophageal stenosis. Dilated to                            15 mm with balloon.                           - Large hiatal hernia.                           - Normal stomach.                           - Duodenal diverticulum.                           -  No specimens collected. Recommendation:           - Patient has a contact number available for                            emergencies. The signs and symptoms of potential                            delayed complications were discussed with the                            patient. Return to normal activities tomorrow.                            Written discharge instructions were provided to the                            patient.                           - Post-dilation diet protocol, then advance diet as                            tolerated.                           - Continue present medications. Okay to reduce                            pantoprazole to 40 mg once daily, but would plan                            this indefinitely with esophagitis history and                            large hiatal hernia.                           - Follow-up as needed. Beverley Fiedler, MD 04/08/2023 10:27:33 AM This report has been signed electronically.

## 2023-04-08 NOTE — Patient Instructions (Signed)
POST DILATION DIET TODAY, SEE HANDOUT, Resume previous diet Tomorrow Continue present medications, HOWEVER, OK TO REDUCE PANTOPRAZOLE TO ONCE DAILY FOLLOW UP WITH OFFICE VISITS AS NEEDED  Handouts/information given for Hiatal hernia, post dilation diet  YOU HAD AN ENDOSCOPIC PROCEDURE TODAY AT THE [facility]:   Refer to the procedure report that was given to you for any specific questions about what was found during the examination.  If the procedure report does not answer your questions, please call your gastroenterologist to clarify.  If you requested that your care partner not be given the details of your procedure findings, then the procedure report has been included in a sealed envelope for you to review at your convenience later.  YOU SHOULD EXPECT: Some feelings of bloating in the abdomen. Passage of more gas than usual.  Walking can help get rid of the air that was put into your GI tract during the procedure and reduce the bloating. If you had a lower endoscopy (such as a colonoscopy or flexible sigmoidoscopy) you may notice spotting of blood in your stool or on the toilet paper. If you underwent a bowel prep for your procedure, you may not have a normal bowel movement for a few days.  Please Note:  You might notice some irritation and congestion in your nose or some drainage.  This is from the oxygen used during your procedure.  There is no need for concern and it should clear up in a day or so.  SYMPTOMS TO REPORT IMMEDIATELY:  Following upper endoscopy (EGD)  Vomiting of blood or coffee ground material  New chest pain or pain under the shoulder blades  Painful or persistently difficult swallowing  New shortness of breath  Fever of 100F or higher  Black, tarry-looking stools  For urgent or emergent issues, a gastroenterologist can be reached at any hour by calling [phone]. Do not use MyChart messaging for urgent concerns.   DIET:  SEE POST DILATION DIET  HANDOUT, but then you may proceed to your regular diet TOMORROW.  Drink plenty of fluids but you should avoid alcoholic beverages for 24 hours.  ACTIVITY:  You should plan to take it easy for the rest of today and you should NOT DRIVE or use heavy machinery until tomorrow (because of the sedation medicines used during the test).    FOLLOW UP: Our staff will call the number listed on your records the next business day following your procedure.  We will call around 7:15- 8:00 am to check on you and address any questions or concerns that you may have regarding the information given to you following your procedure. If we do not reach you, we will leave a message.     SIGNATURES/CONFIDENTIALITY: You and/or your care partner have signed paperwork which will be entered into your electronic medical record.  These signatures attest to the fact that that the information above on your After Visit Summary has been reviewed and is understood.  Full responsibility of the confidentiality of this discharge information lies with you and/or your care-partner.

## 2023-04-08 NOTE — Progress Notes (Signed)
Called to room to assist during endoscopic procedure.  Patient ID and intended procedure confirmed with present staff. Received instructions for my participation in the procedure from the performing physician.  

## 2023-04-08 NOTE — Progress Notes (Signed)
Report to PACU, RN, vss, BBS= Clear.  

## 2023-04-08 NOTE — Progress Notes (Signed)
Please see office note dated 03/21/2023 for details and current H&P  Patient with a history of esophageal food impaction, esophagitis and esophageal stricture  She remains appropriate for [facility] EGD with possible dilation today

## 2023-04-08 NOTE — Progress Notes (Signed)
Pt's states no medical or surgical changes since previsit or office visit. 

## 2023-04-11 ENCOUNTER — Telehealth: Payer: Self-pay | Admitting: *Deleted

## 2023-04-11 NOTE — Telephone Encounter (Signed)
  Follow up Call-     04/08/2023    9:35 AM  Call back number  Post procedure Call Back phone  # [phone]  Permission to leave phone message No     Patient questions:  Do you have a fever, pain , or abdominal swelling? No. Pain Score  0 *  Have you tolerated food without any problems? Yes.    Have you been able to return to your normal activities? Yes.    Do you have any questions about your discharge instructions: Diet   No. Medications  No. Follow up visit  No.  Do you have questions or concerns about your Care? No.  Actions: * If pain score is 4 or above: No action needed, pain <4.

## 2023-04-16 IMAGING — DX DG CHEST 2V
2 series · 2 of 2 positions shown · non-contrast
Comparison: None.

CLINICAL DATA: Upper abdominal pain.

EXAM:
CHEST - 2 VIEW

[chest pa]
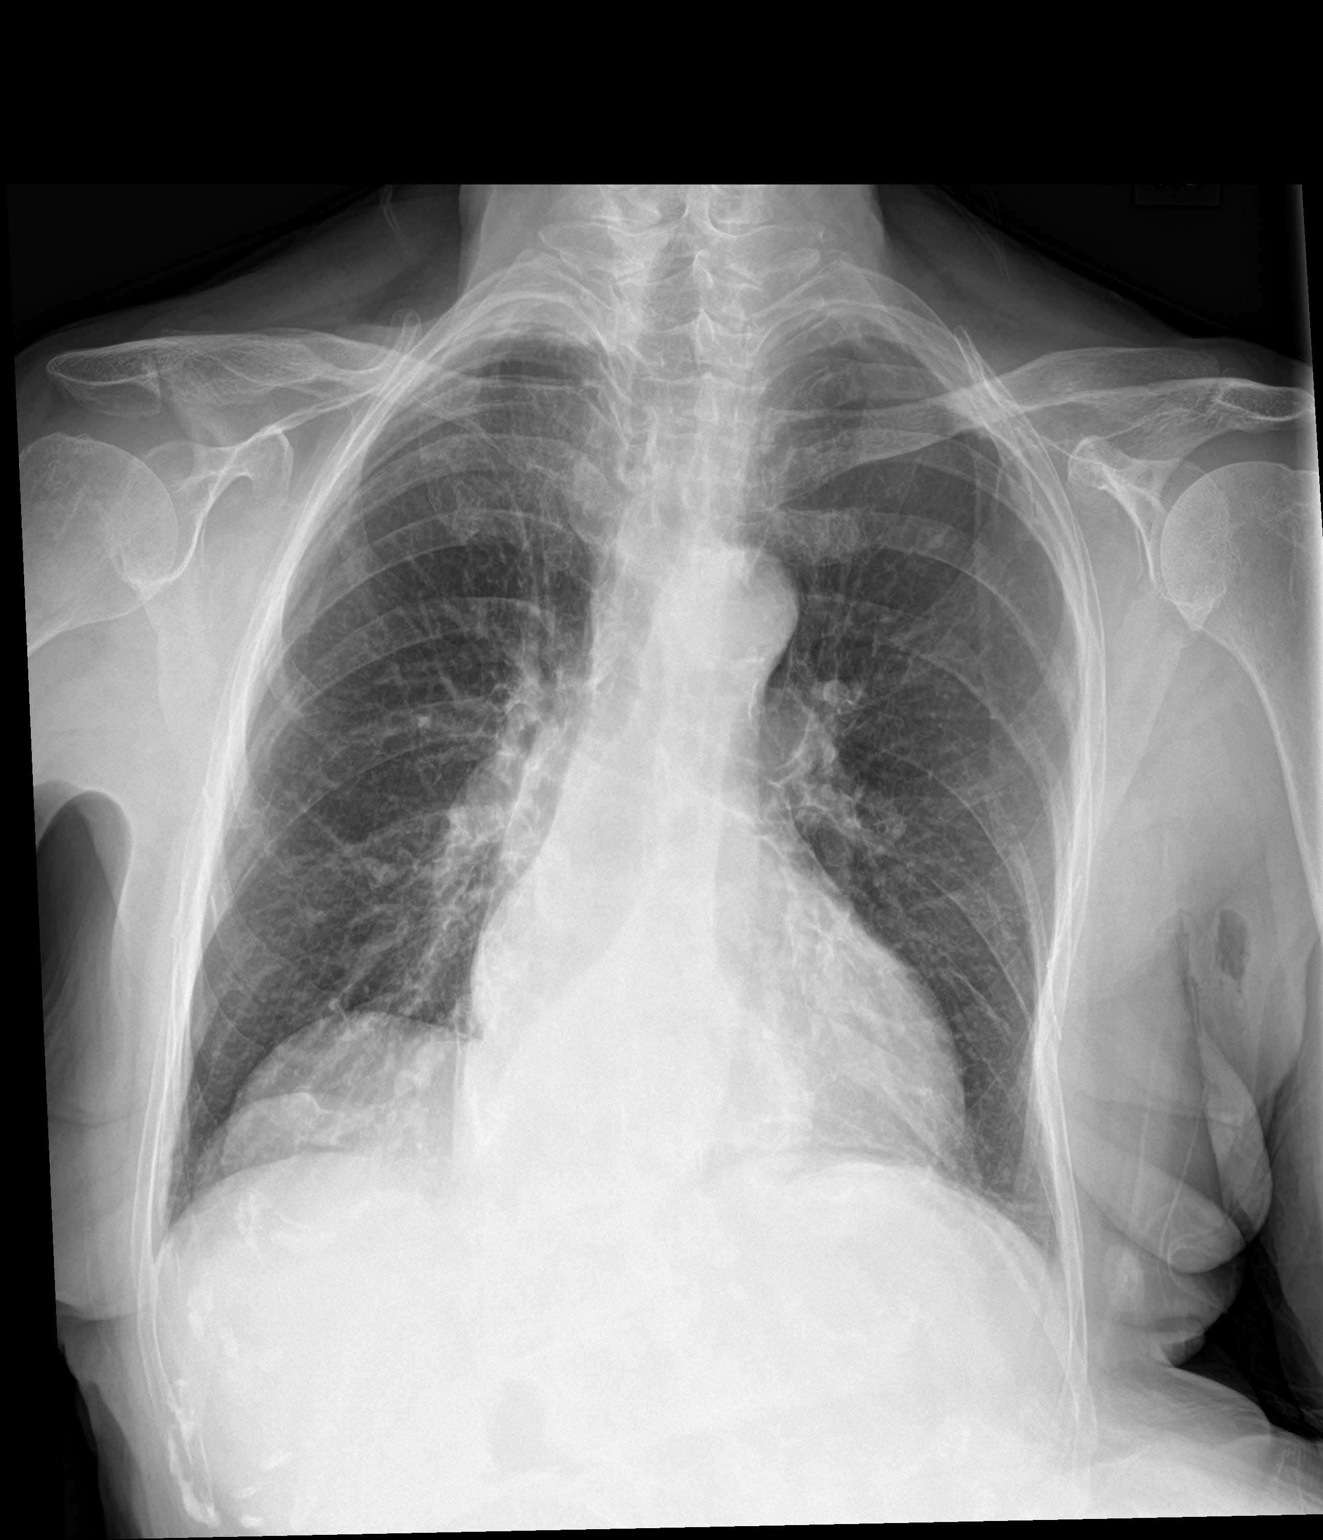

[chest lat]
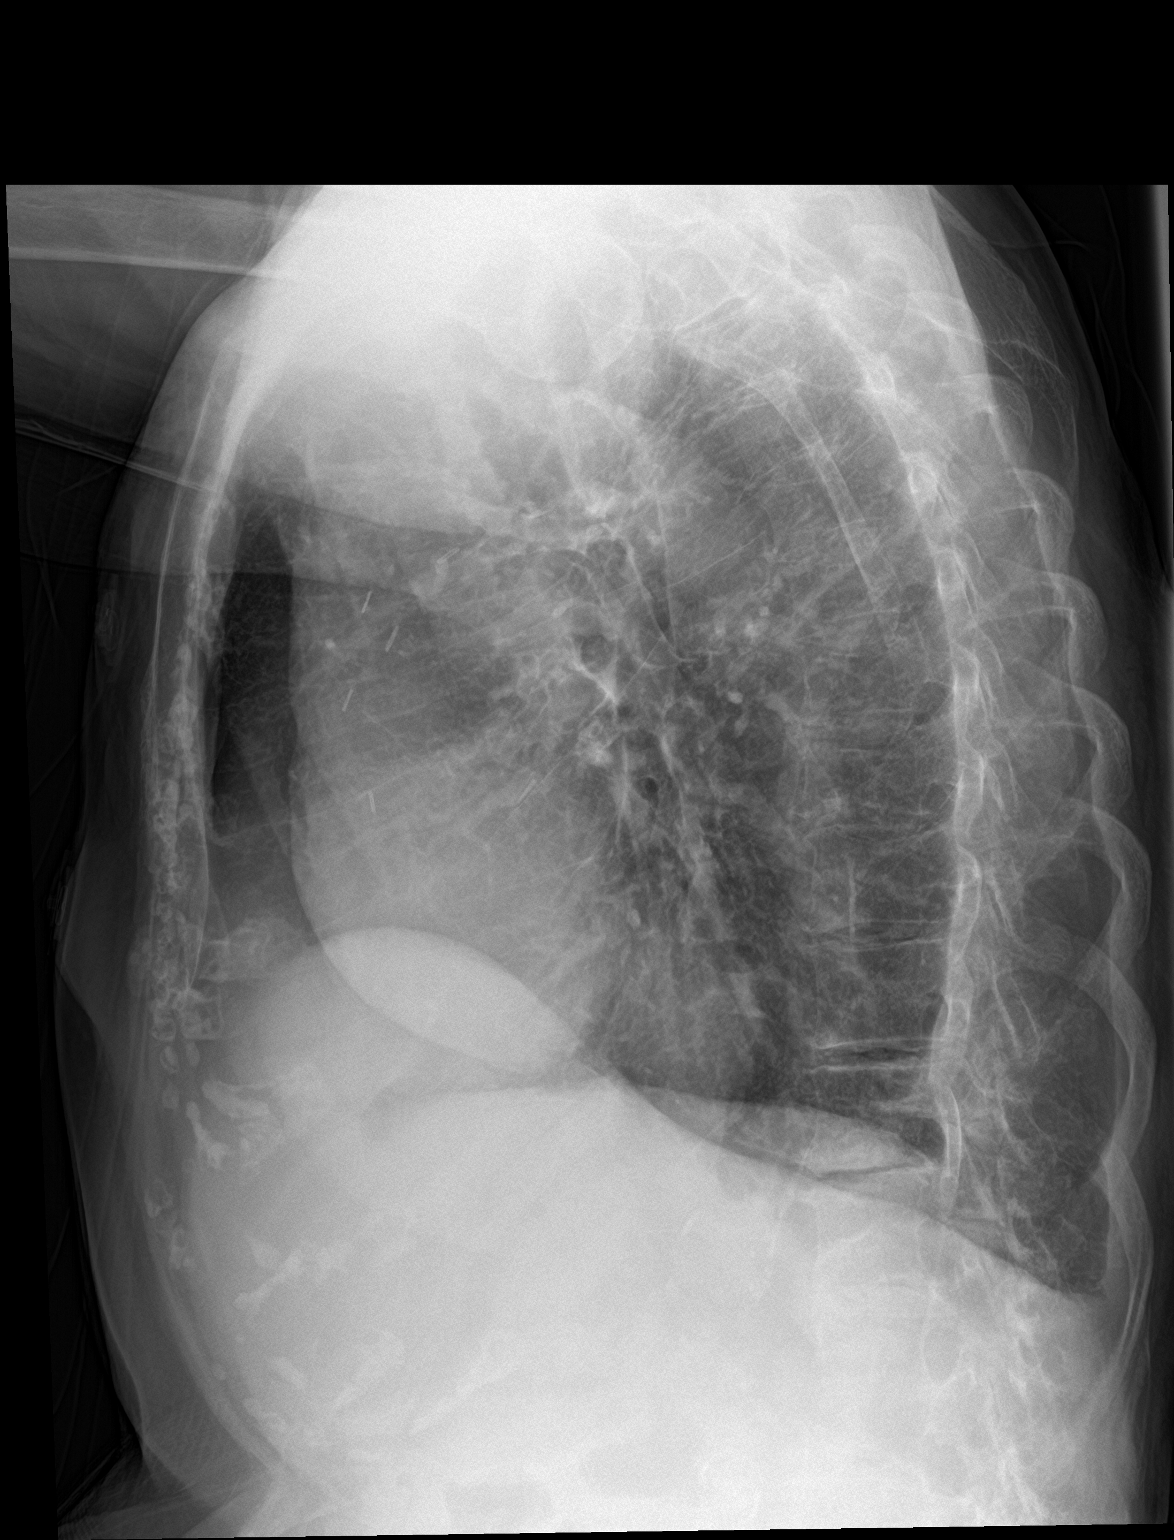

[2 of 2 positions shown; findings below may reference images not displayed]

FINDINGS: Normal mediastinum and cardiac silhouette. Normal pulmonary
vasculature. No evidence of effusion, infiltrate, or pneumothorax.
No acute bony abnormality.
IMPRESSION: No acute cardiopulmonary process.

## 2023-04-16 IMAGING — US US ABDOMEN LIMITED
1 series · 14 of 25 positions shown · non-contrast
Comparison: None.

CLINICAL DATA: Right upper quadrant pain for 1 week.

EXAM:
ULTRASOUND ABDOMEN LIMITED RIGHT UPPER QUADRANT

[Series 1: us abdomen limited ruq (liver/gb) · 14 of 34 slices shown]
[im 1/34]
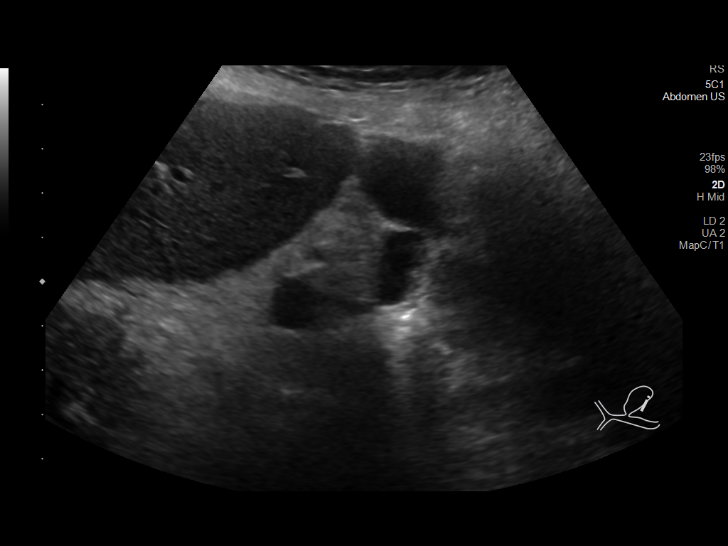
[im 3/34]
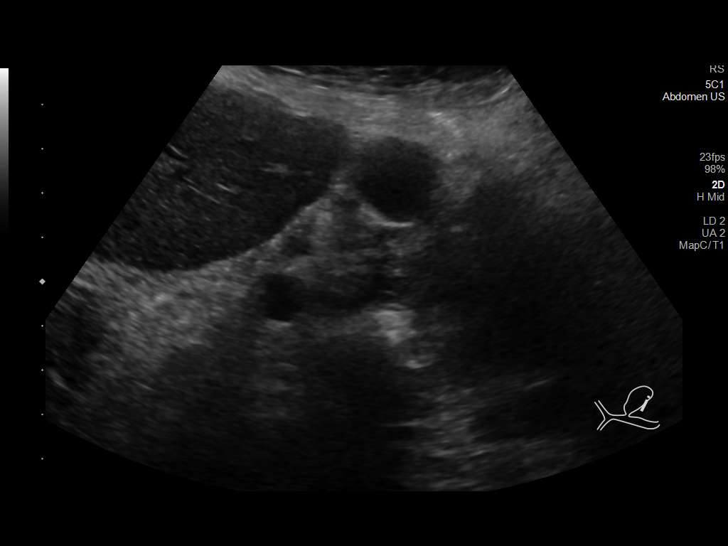
[im 6/34]
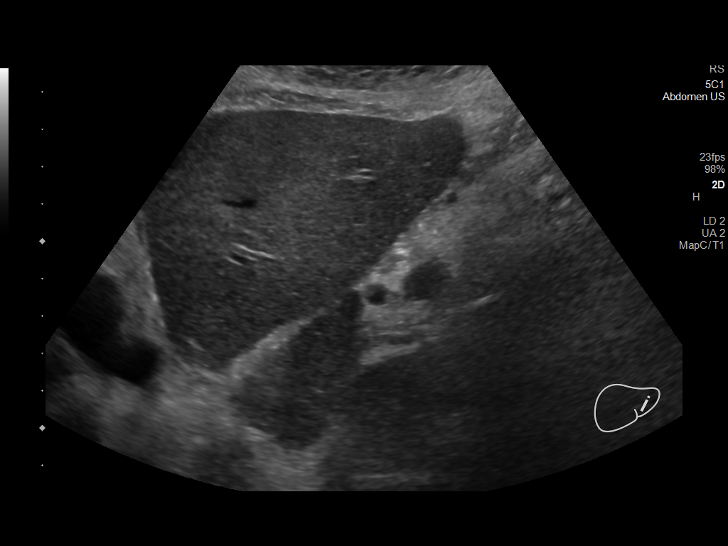
[im 9/34]
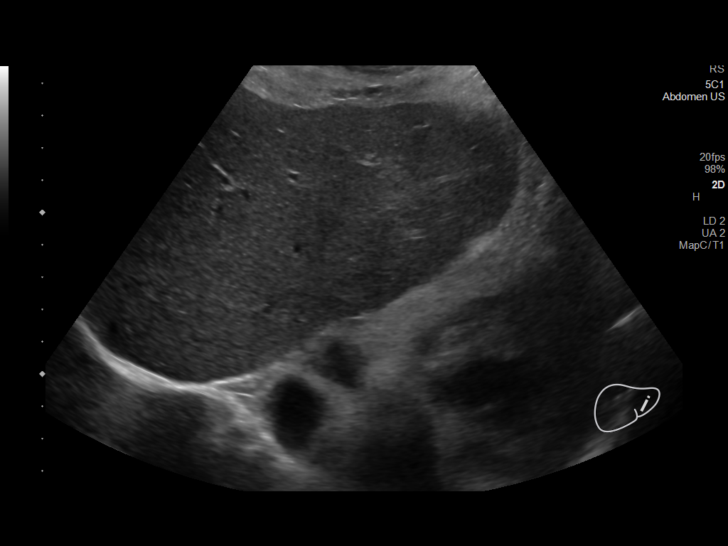
[im 12/34]
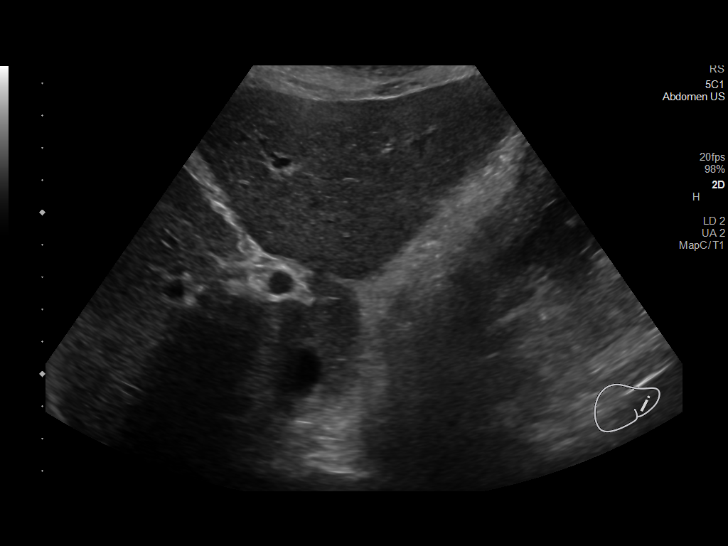
[im 13/34]
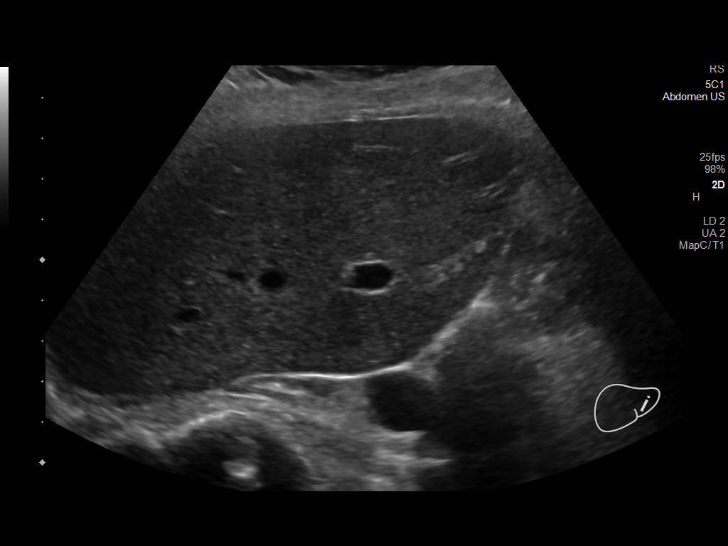
[im 16/34]
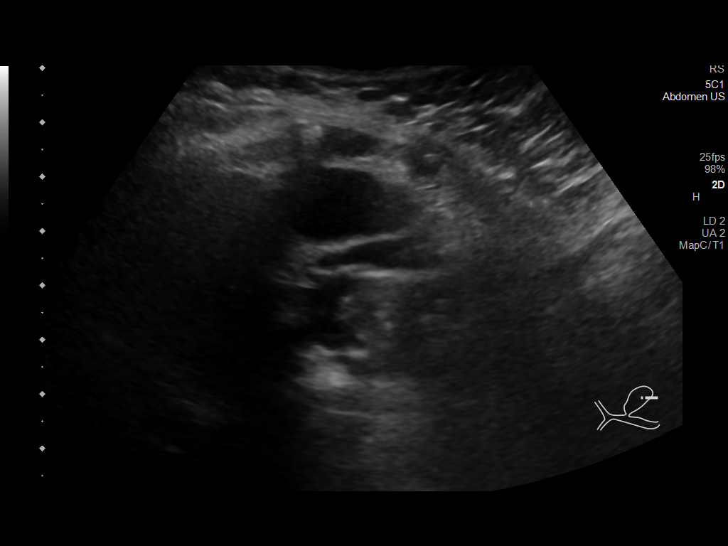
[im 18/34]
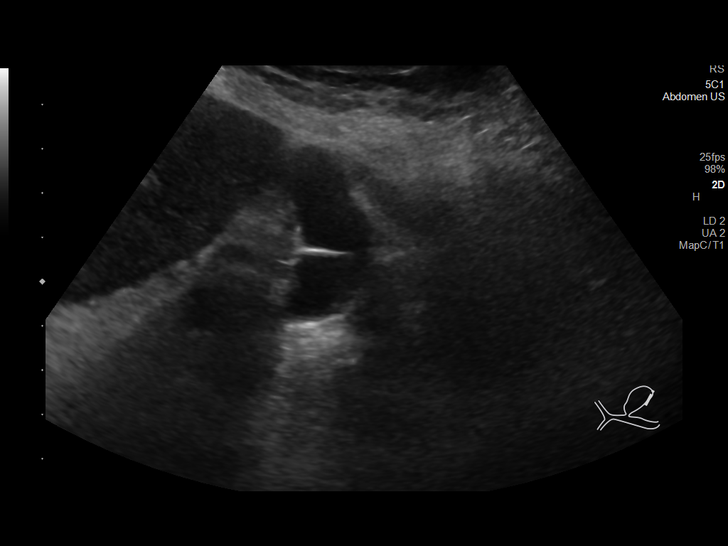
[im 21/34]
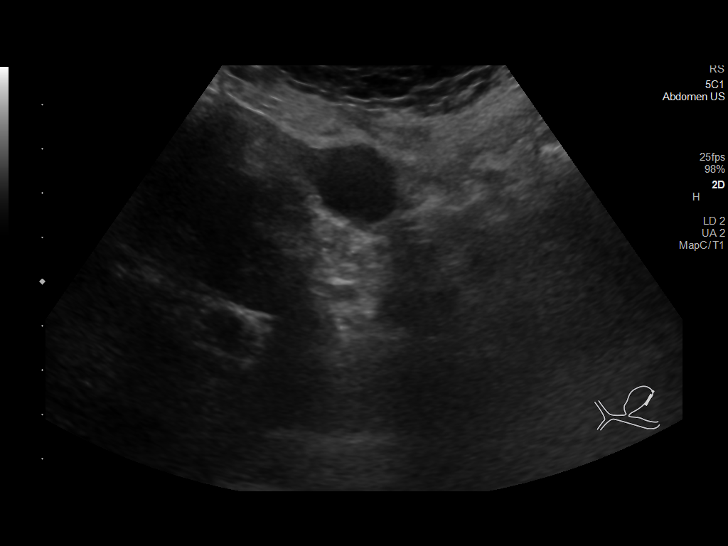
[im 23/34]
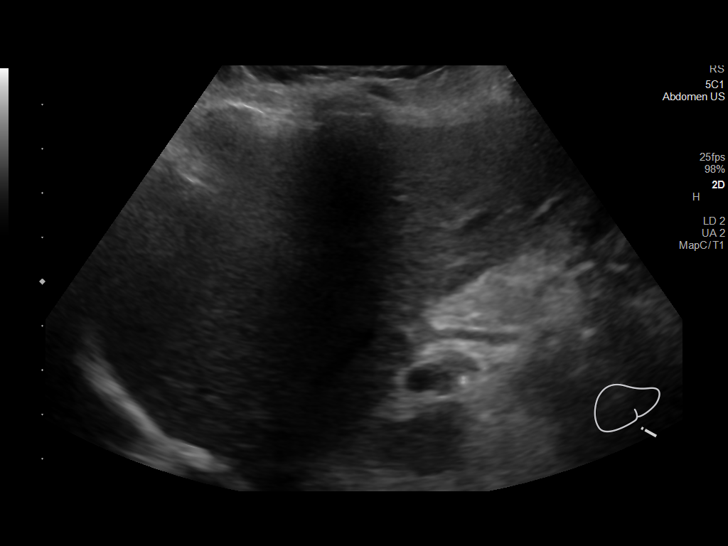
[im 25/34]
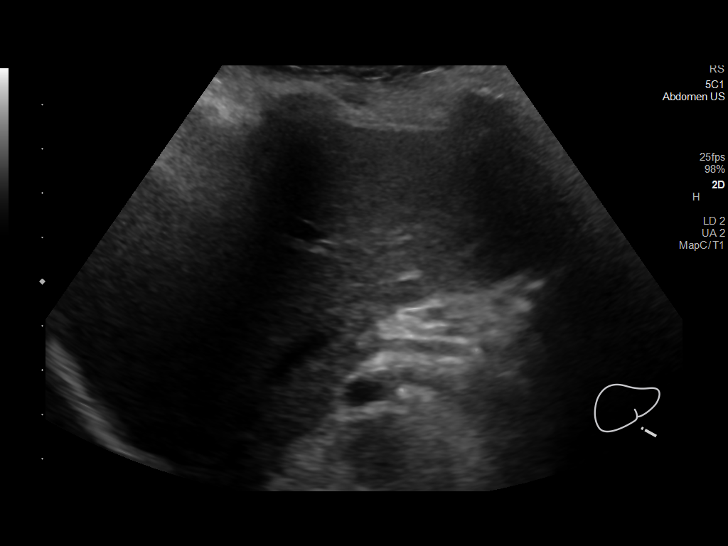
[im 28/34]
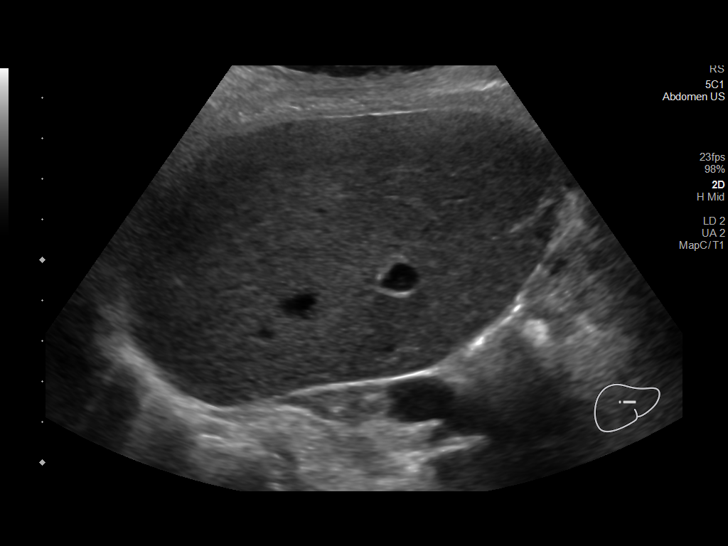
[im 31/34]
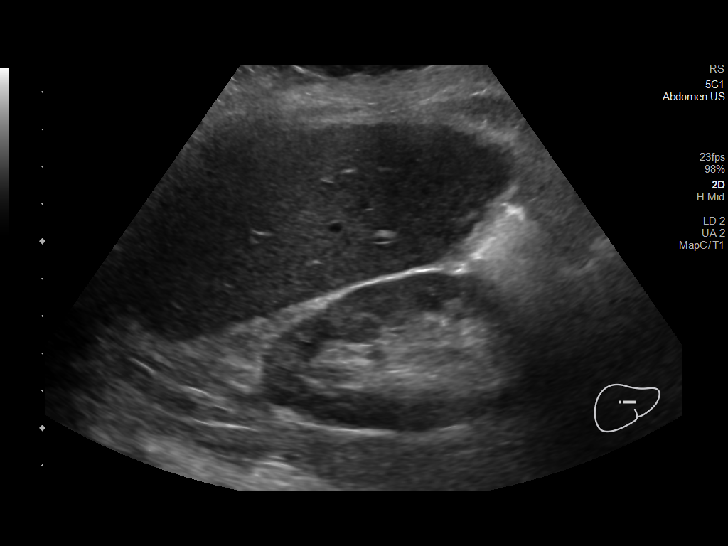
[im 34/34]
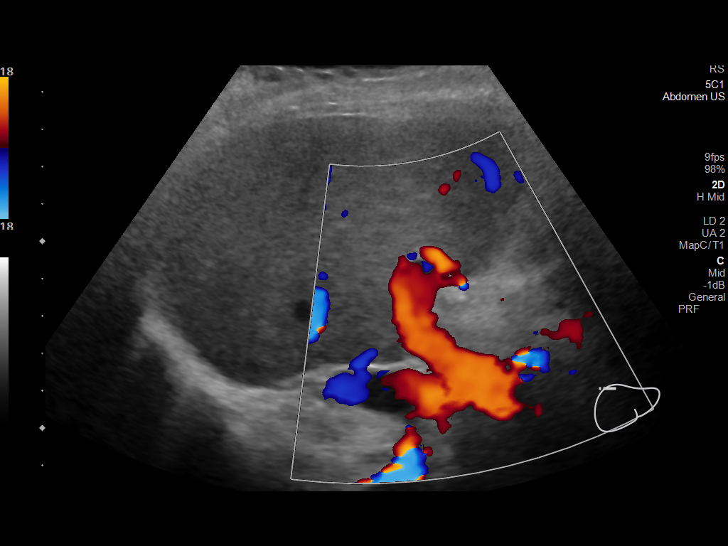

[14 of 25 positions shown; findings below may reference images not displayed]

FINDINGS: Gallbladder:

No gallstones or wall thickening visualized. No sonographic Murphy
sign noted by sonographer.

Common bile duct:

Diameter: 3 mm.

Liver:

Nonspecific slight nodularity of left hepatic lobe. No focal lesion
identified. Within normal limits in parenchymal echogenicity. Portal
vein is patent on color Doppler imaging with normal direction of
blood flow towards the liver.

Other: None.
IMPRESSION: Nonspecific slight nodularity of left hepatic lobe. This finding can
be seen in the setting of cirrhosis.

## 2023-04-28 ENCOUNTER — Other Ambulatory Visit: Payer: Self-pay | Admitting: Family Medicine

## 2023-05-16 ENCOUNTER — Other Ambulatory Visit: Payer: Self-pay | Admitting: Internal Medicine

## 2023-05-31 ENCOUNTER — Other Ambulatory Visit: Payer: Self-pay | Admitting: Family Medicine

## 2023-06-03 ENCOUNTER — Other Ambulatory Visit: Payer: Self-pay | Admitting: Family Medicine

## 2023-06-03 NOTE — Telephone Encounter (Signed)
Requested Prescriptions  Pending Prescriptions Disp Refills   carvedilol (COREG) 12.5 MG tablet [Pharmacy Med Name: Carvedilol 12.5 MG Oral Tablet] 180 tablet 0    Sig: TAKE 1 TABLET BY MOUTH TWICE DAILY WITH A MEAL     Cardiovascular: Beta Blockers 3 Failed - 06/03/2023 11:53 AM      Failed - AST in normal range and within 360 days    AST  Date Value Ref Range Status  02/01/2023 14 (L) 15 - 41 U/L Final  02/12/2014 16 5 - 34 U/L Final         Passed - Cr in normal range and within 360 days    Creatinine  Date Value Ref Range Status  02/12/2014 1.0 0.6 - 1.1 mg/dL Final   Creatinine, Ser  Date Value Ref Range Status  02/04/2023 0.83 0.57 - 1.00 mg/dL Final         Passed - ALT in normal range and within 360 days    ALT  Date Value Ref Range Status  02/01/2023 11 0 - 44 U/L Final  02/12/2014 17 0 - 55 U/L Final         Passed - Last BP in normal range    BP Readings from Last 1 Encounters:  04/08/23 124/66         Passed - Last Heart Rate in normal range    Pulse Readings from Last 1 Encounters:  04/08/23 (!) 55         Passed - Valid encounter within last 6 months    Recent Outpatient Visits           2 months ago Essential hypertension   [facility], MD   3 months ago Essential hypertension   [facility], MD   5 months ago Essential hypertension   [facility], MD   7 months ago Chest pain, unspecified type   [facility] [place], NP   8 months ago Essential hypertension   [place] [facility], MD       Future Appointments             In 1 month Georganna Skeans, MD [facility]

## 2023-07-04 ENCOUNTER — Encounter: Payer: Self-pay | Admitting: Family Medicine

## 2023-07-04 ENCOUNTER — Ambulatory Visit (INDEPENDENT_AMBULATORY_CARE_PROVIDER_SITE_OTHER): Payer: Medicare Other | Admitting: Family Medicine

## 2023-07-04 VITALS — BP 169/77 | HR 61 | Temp 98.3°F | Resp 16 | Ht <= 58 in | Wt 110.0 lb

## 2023-07-04 DIAGNOSIS — R634 Abnormal weight loss: Secondary | ICD-10-CM

## 2023-07-04 DIAGNOSIS — E785 Hyperlipidemia, unspecified: Secondary | ICD-10-CM

## 2023-07-04 DIAGNOSIS — Z13820 Encounter for screening for osteoporosis: Secondary | ICD-10-CM

## 2023-07-04 DIAGNOSIS — R2681 Unsteadiness on feet: Secondary | ICD-10-CM | POA: Diagnosis not present

## 2023-07-04 DIAGNOSIS — I10 Essential (primary) hypertension: Secondary | ICD-10-CM | POA: Diagnosis not present

## 2023-07-04 DIAGNOSIS — Z780 Asymptomatic menopausal state: Secondary | ICD-10-CM

## 2023-07-04 DIAGNOSIS — F329 Major depressive disorder, single episode, unspecified: Secondary | ICD-10-CM | POA: Diagnosis not present

## 2023-07-04 DIAGNOSIS — E039 Hypothyroidism, unspecified: Secondary | ICD-10-CM

## 2023-07-04 MED ORDER — MIRTAZAPINE 7.5 MG PO TABS: 7.5000 mg | ORAL_TABLET | Freq: Every day | ORAL | 2 refills | Status: DC

## 2023-07-04 NOTE — Progress Notes (Unsigned)
 Established Patient Office Visit  Subjective    Patient ID: Claudia Stein, female    DOB: 1933/06/08  Age: 89 y.o. MRN: [id number]  CC:  Chief Complaint  Patient presents with   Follow-up    3 month    HPI [name] presents for routine follow up of hypertension. Patient reports that she has increased social stressors as her only grandson is in the hospital and has multiple issues.   Outpatient Encounter Medications as of 07/04/2023  Medication Sig   amLODipine (NORVASC) 10 MG tablet Take 1 tablet by mouth once daily   Calcium Carbonate-Vitamin D (CALCIUM PLUS VITAMIN D PO) Take 1 tablet by mouth daily.     carvedilol (COREG) 12.5 MG tablet TAKE 1 TABLET BY MOUTH TWICE DAILY WITH A MEAL   cholecalciferol (VITAMIN D3) 25 MCG (1000 UNIT) tablet Take 1,000 Units by mouth daily.   latanoprost (XALATAN) 0.005 % ophthalmic solution Place 1 drop into both eyes at bedtime.    levothyroxine (SYNTHROID) 75 MCG tablet Take 1 tablet by mouth once daily   mirtazapine (REMERON) 7.5 MG tablet Take 1 tablet (7.5 mg total) by mouth at bedtime.   Multiple Vitamins-Minerals (PRESERVISION AREDS 2 PO) Take 1 tablet by mouth in the morning and at bedtime.    pantoprazole (PROTONIX) 40 MG tablet Take 1 tablet (40 mg total) by mouth daily.   simvastatin (ZOCOR) 5 MG tablet TAKE 1 TABLET BY MOUTH AT BEDTIME   vitamin B-12 (CYANOCOBALAMIN) 1000 MCG tablet Take 1,000 mcg by mouth daily.     vitamin C (ASCORBIC ACID) 500 MG tablet Take 500 mg by mouth daily.     No facility-administered encounter medications on file as of 07/04/2023.    Past Medical History:  Diagnosis Date   Bruises easily    Cancer (HCC) 04-16-09, 2012   Breast- bilateral   Hyperlipidemia    Hypertension    PONV (postoperative nausea and vomiting)    Thyroid disease     Past Surgical History:  Procedure Laterality Date   APPENDECTOMY     BREAST SURGERY  04-16-09   left  Lumpectomy   ESOPHAGOGASTRODUODENOSCOPY (EGD) WITH  PROPOFOL N/A 01/31/2023   Procedure: ESOPHAGOGASTRODUODENOSCOPY (EGD) WITH PROPOFOL;  Surgeon: Beverley Fiedler, MD;  Location: [facility] ENDOSCOPY;  Service: Gastroenterology;  Laterality: N/A;   FOREIGN BODY REMOVAL  01/31/2023   Procedure: FOREIGN BODY REMOVAL;  Surgeon: Beverley Fiedler, MD;  Location: [facility] ENDOSCOPY;  Service: Gastroenterology;;   FRACTURE SURGERY     broke right ankle in the 80's   MASTECTOMY  November 2011   right breast   MASTECTOMY W/ SENTINEL NODE BIOPSY  03/22/2011   Procedure: MASTECTOMY WITH SENTINEL LYMPH NODE BIOPSY;  Surgeon: Kandis Cocking, MD;  Location: [facility] OR;  Service: General;  Laterality: Left;  left mastectomy, sential lymph node biopsy, left axilla    Family History  Problem Relation Age of Onset   Heart disease Mother 41   Cancer Paternal Aunt        colon   Colon cancer Neg Hx    Esophageal cancer Neg Hx    Rectal cancer Neg Hx    Stomach cancer Neg Hx     Social History   Socioeconomic History   Marital status: Widowed    Spouse name: Not on file   Number of children: Not on file   Years of education: Not on file   Highest education level: Not on file  Occupational History  Not on file  Tobacco Use   Smoking status: Never    Passive exposure: Never   Smokeless tobacco: Never  Substance and Sexual Activity   Alcohol use: No   Drug use: No   Sexual activity: Not Currently  Other Topics Concern   Not on file  Social History Narrative   Not on file   Social Drivers of Health   Financial Resource Strain: Low Risk  (07/04/2023)   Overall Financial Resource Strain (CARDIA)    Difficulty of Paying Living Expenses: Not hard at all  Food Insecurity: No Food Insecurity (02/01/2023)   Hunger Vital Sign    Worried About Running Out of Food in the Last Year: Never true    Ran Out of Food in the Last Year: Never true  Transportation Needs: No Transportation Needs (02/01/2023)   PRAPARE - Administrator, Civil Service (Medical): No    Lack  of Transportation (Non-Medical): No  Physical Activity: Sufficiently Active (07/04/2023)   Exercise Vital Sign    Days of Exercise per Week: 7 days    Minutes of Exercise per Session: 30 min  Stress: Stress Concern Present (07/04/2023)   [employer] of Occupational Health - Occupational Stress Questionnaire    Feeling of Stress : Very much  Social Connections: Moderately Isolated (07/04/2023)   Social Connection and Isolation Panel [NHANES]    Frequency of Communication with Friends and Family: More than three times a week    Frequency of Social Gatherings with Friends and Family: More than three times a week    Attends Religious Services: More than 4 times per year    Active Member of [employer] or Organizations: No    Attends Banker Meetings: Never    Marital Status: Widowed  Intimate Partner Violence: Not At Risk (02/01/2023)   Humiliation, Afraid, Rape, and Kick questionnaire    Fear of Current or Ex-Partner: No    Emotionally Abused: No    Physically Abused: No    Sexually Abused: No    Review of Systems  Constitutional:  Positive for weight loss.  Psychiatric/Behavioral:  Positive for depression. Negative for suicidal ideas. The patient is nervous/anxious.   All other systems reviewed and are negative.       Objective    BP (!) 169/77   Pulse 61   Temp 98.3 F (36.8 C) (Oral)   Resp 16   Ht 4\' 9"  (1.448 m)   Wt 110 lb (49.9 kg)   SpO2 94%   BMI 23.80 kg/m   Physical Exam Vitals and nursing note reviewed.  Constitutional:      General: She is not in acute distress. HENT:     Head: Normocephalic.  Cardiovascular:     Rate and Rhythm: Normal rate and regular rhythm.  Pulmonary:     Effort: Pulmonary effort is normal.     Breath sounds: Normal breath sounds.  Abdominal:     Palpations: Abdomen is soft.     Tenderness: There is no abdominal tenderness.  Musculoskeletal:        General: Deformity (kyphosis) present.  Neurological:     General: No  focal deficit present.     Mental Status: She is alert and oriented to person, place, and time.         Assessment & Plan:   1. Essential hypertension (Primary) Slightly elevated readings. Continue   2. Gait instability Consider changing from a quad cane to a rollator. Referral to PT for  further eval/mgt - Ambulatory referral to Physical Therapy  3. Reactive depression Remeron prescribed  4. Loss of weight 5 lb wt loss since last visit. Continue daily supplements.   5. Hypothyroidism, unspecified type Appears stable. Continue   6. Hyperlipidemia, unspecified hyperlipidemia type Continue   7. Encounter for osteoporosis screening in asymptomatic postmenopausal patient Patient with kyphosis without pain. Referral for dexa scan - DG Bone Density; Future    Return in about 4 weeks (around 08/01/2023) for follow up.   Tommie Raymond, MD

## 2023-07-05 ENCOUNTER — Encounter: Payer: Self-pay | Admitting: Family Medicine

## 2023-07-11 ENCOUNTER — Ambulatory Visit
Admission: RE | Admit: 2023-07-11 | Discharge: 2023-07-11 | Disposition: A | Discharge status: Home / Self Care | Source: Ambulatory Visit | Attending: Family Medicine | Admitting: Family Medicine

## 2023-07-11 DIAGNOSIS — E2839 Other primary ovarian failure: Secondary | ICD-10-CM | POA: Diagnosis not present

## 2023-07-11 DIAGNOSIS — Z13820 Encounter for screening for osteoporosis: Secondary | ICD-10-CM

## 2023-07-11 DIAGNOSIS — N958 Other specified menopausal and perimenopausal disorders: Secondary | ICD-10-CM | POA: Diagnosis not present

## 2023-07-11 DIAGNOSIS — M8588 Other specified disorders of bone density and structure, other site: Secondary | ICD-10-CM | POA: Diagnosis not present

## 2023-07-14 DIAGNOSIS — R293 Abnormal posture: Secondary | ICD-10-CM | POA: Diagnosis not present

## 2023-07-14 DIAGNOSIS — R2681 Unsteadiness on feet: Secondary | ICD-10-CM | POA: Diagnosis not present

## 2023-07-14 DIAGNOSIS — M6281 Muscle weakness (generalized): Secondary | ICD-10-CM | POA: Diagnosis not present

## 2023-07-14 DIAGNOSIS — R296 Repeated falls: Secondary | ICD-10-CM | POA: Diagnosis not present

## 2023-07-19 ENCOUNTER — Telehealth: Payer: Self-pay | Admitting: Family Medicine

## 2023-07-19 NOTE — Telephone Encounter (Signed)
 A document form has been faxed:  Plan of Care , to be filled out by provider. Send document back via Fax within 5-days. Document is located in providers tray at front office.           Fax number:  [phone]

## 2023-07-20 DIAGNOSIS — R296 Repeated falls: Secondary | ICD-10-CM | POA: Diagnosis not present

## 2023-07-20 DIAGNOSIS — R293 Abnormal posture: Secondary | ICD-10-CM | POA: Diagnosis not present

## 2023-07-20 DIAGNOSIS — R2681 Unsteadiness on feet: Secondary | ICD-10-CM | POA: Diagnosis not present

## 2023-07-20 DIAGNOSIS — M6281 Muscle weakness (generalized): Secondary | ICD-10-CM | POA: Diagnosis not present

## 2023-07-20 NOTE — Telephone Encounter (Signed)
 I put documentation on MD Wilson desk to sign

## 2023-07-27 DIAGNOSIS — M6281 Muscle weakness (generalized): Secondary | ICD-10-CM | POA: Diagnosis not present

## 2023-07-27 DIAGNOSIS — R296 Repeated falls: Secondary | ICD-10-CM | POA: Diagnosis not present

## 2023-07-27 DIAGNOSIS — R293 Abnormal posture: Secondary | ICD-10-CM | POA: Diagnosis not present

## 2023-07-27 DIAGNOSIS — R2681 Unsteadiness on feet: Secondary | ICD-10-CM | POA: Diagnosis not present

## 2023-07-28 ENCOUNTER — Ambulatory Visit: Payer: Medicare Other

## 2023-07-28 DIAGNOSIS — Z0000 Encounter for general adult medical examination without abnormal findings: Secondary | ICD-10-CM

## 2023-07-28 NOTE — Patient Instructions (Signed)
 Claudia Stein , Thank you for taking time to come for your Medicare Wellness Visit. I appreciate your ongoing commitment to your health goals. Please review the following plan we discussed and let me know if I can assist you in the future.   Referrals/Orders/Follow-Ups/Clinician Recommendations: none  This is a list of the screening recommended for you and due dates:  Health Maintenance  Topic Date Due   COVID-19 Vaccine (1) Never done   Flu Shot  08/01/2023*   Zoster (Shingles) Vaccine (1 of 2) 10/04/2023*   Pneumonia Vaccine (1 of 1 - PCV) 02/04/2024*   Medicare Annual Wellness Visit  07/27/2024   DTaP/Tdap/Td vaccine (3 - Td or Tdap) 12/04/2031   DEXA scan (bone density measurement)  Completed   HPV Vaccine  Aged Out  *Topic was postponed. The date shown is not the original due date.    Advanced directives: (Copy Requested) Please bring a copy of your health care power of attorney and living will to the office to be added to your chart at your convenience. You can mail to [facility] [address] [place], [place] [address] or email to [email or web address]  Next Medicare Annual Wellness Visit scheduled for next year: Yes  insert Preventive Care attachment Insert FALL PREVENTION attachment if needed

## 2023-07-28 NOTE — Telephone Encounter (Signed)
 Paper was fax back on 3/19

## 2023-07-28 NOTE — Progress Notes (Signed)
 Subjective:   Claudia Stein is a 89 y.o. who presents for a Medicare Wellness preventive visit.  Visit Complete: Virtual I connected with  Claudia Stein on 07/28/23 by a audio enabled telemedicine application and verified that I am speaking with the correct person using two identifiers.  Patient Location: Home  Provider Location: Home Office  I discussed the limitations of evaluation and management by telemedicine. The patient expressed understanding and agreed to proceed.  Vital Signs: Because this visit was a virtual/telehealth visit, some criteria may be missing or patient reported. Any vitals not documented were not able to be obtained and vitals that have been documented are patient reported.  VideoError- Librarian, academic were attempted between this provider and patient, however failed, due to patient having technical difficulties OR patient did not have access to video capability.  We continued and completed visit with audio only.   Persons Participating in Visit: Patient assisted by daughter in law.  AWV Questionnaire: No: Patient Medicare AWV questionnaire was not completed prior to this visit.  Cardiac Risk Factors include: advanced age (>68men, >68 women);dyslipidemia;hypertension     Objective:    Today's Vitals   There is no height or weight on file to calculate BMI.     07/28/2023    1:16 PM 02/02/2023    9:00 AM 01/31/2023    2:17 PM 09/29/2022   11:10 AM 03/05/2021    9:58 AM 12/31/2019    5:00 AM 03/22/2011    8:30 PM  Advanced Directives  Does Patient Have a Medical Advance Directive? Yes  Yes No Yes Yes Patient has advance directive, copy in chart  Type of Advance Directive Healthcare Power of [place];Living will Living will Living will   Living will Healthcare Power of [place];Living will  Does patient want to make changes to medical advance directive?     No - Patient declined No - Patient declined   Copy of Healthcare Power of  Attorney in Chart? No - copy requested No - copy requested       Pre-existing out of facility DNR order (yellow form or pink MOST form)       No    Current Medications (verified) Outpatient Encounter Medications as of 07/28/2023  Medication Sig   amLODipine (NORVASC) 10 MG tablet Take 1 tablet by mouth once daily   Calcium Carbonate-Vitamin D (CALCIUM PLUS VITAMIN D PO) Take 1 tablet by mouth daily.     carvedilol (COREG) 12.5 MG tablet TAKE 1 TABLET BY MOUTH TWICE DAILY WITH A MEAL   cholecalciferol (VITAMIN D3) 25 MCG (1000 UNIT) tablet Take 1,000 Units by mouth daily.   latanoprost (XALATAN) 0.005 % ophthalmic solution Place 1 drop into both eyes at bedtime.    levothyroxine (SYNTHROID) 75 MCG tablet Take 1 tablet by mouth once daily   mirtazapine (REMERON) 7.5 MG tablet Take 1 tablet (7.5 mg total) by mouth at bedtime.   Multiple Vitamins-Minerals (PRESERVISION AREDS 2 PO) Take 1 tablet by mouth in the morning and at bedtime.    pantoprazole (PROTONIX) 40 MG tablet Take 1 tablet (40 mg total) by mouth daily.   simvastatin (ZOCOR) 5 MG tablet TAKE 1 TABLET BY MOUTH AT BEDTIME   vitamin B-12 (CYANOCOBALAMIN) 1000 MCG tablet Take 1,000 mcg by mouth daily.     vitamin C (ASCORBIC ACID) 500 MG tablet Take 500 mg by mouth daily.     No facility-administered encounter medications on file as of 07/28/2023.  Allergies (verified) Haemophilus influenzae vaccines   History: Past Medical History:  Diagnosis Date   Bruises easily    Cancer (HCC) 04-16-09, 2012   Breast- bilateral   Hyperlipidemia    Hypertension    PONV (postoperative nausea and vomiting)    Thyroid disease    Past Surgical History:  Procedure Laterality Date   APPENDECTOMY     BREAST SURGERY  04-16-09   left  Lumpectomy   ESOPHAGOGASTRODUODENOSCOPY (EGD) WITH PROPOFOL N/A 01/31/2023   Procedure: ESOPHAGOGASTRODUODENOSCOPY (EGD) WITH PROPOFOL;  Surgeon: Beverley Fiedler, MD;  Location: [facility] ENDOSCOPY;  Service:  Gastroenterology;  Laterality: N/A;   FOREIGN BODY REMOVAL  01/31/2023   Procedure: FOREIGN BODY REMOVAL;  Surgeon: Beverley Fiedler, MD;  Location: [facility] ENDOSCOPY;  Service: Gastroenterology;;   FRACTURE SURGERY     broke right ankle in the 80's   MASTECTOMY  November 2011   right breast   MASTECTOMY W/ SENTINEL NODE BIOPSY  03/22/2011   Procedure: MASTECTOMY WITH SENTINEL LYMPH NODE BIOPSY;  Surgeon: Kandis Cocking, MD;  Location: [facility] OR;  Service: General;  Laterality: Left;  left mastectomy, sential lymph node biopsy, left axilla   Family History  Problem Relation Age of Onset   Heart disease Mother 32   Cancer Paternal Aunt        colon   Colon cancer Neg Hx    Esophageal cancer Neg Hx    Rectal cancer Neg Hx    Stomach cancer Neg Hx    Social History   Socioeconomic History   Marital status: Widowed    Spouse name: Not on file   Number of children: Not on file   Years of education: Not on file   Highest education level: Not on file  Occupational History   Not on file  Tobacco Use   Smoking status: Never    Passive exposure: Never   Smokeless tobacco: Never  Vaping Use   Vaping status: Never Used  Substance and Sexual Activity   Alcohol use: No   Drug use: No   Sexual activity: Not Currently  Other Topics Concern   Not on file  Social History Narrative   Not on file   Social Drivers of Health   Financial Resource Strain: Low Risk  (07/28/2023)   Overall Financial Resource Strain (CARDIA)    Difficulty of Paying Living Expenses: Not hard at all  Food Insecurity: No Food Insecurity (07/28/2023)   Hunger Vital Sign    Worried About Running Out of Food in the Last Year: Never true    Ran Out of Food in the Last Year: Never true  Transportation Needs: No Transportation Needs (07/28/2023)   PRAPARE - Administrator, Civil Service (Medical): No    Lack of Transportation (Non-Medical): No  Physical Activity: Sufficiently Active (07/28/2023)   Exercise Vital  Sign    Days of Exercise per Week: 7 days    Minutes of Exercise per Session: 60 min  Stress: No Stress Concern Present (07/28/2023)   [employer] of Occupational Health - Occupational Stress Questionnaire    Feeling of Stress : Not at all  Recent Concern: Stress - Stress Concern Present (07/04/2023)   [employer] of Occupational Health - Occupational Stress Questionnaire    Feeling of Stress : Very much  Social Connections: Moderately Isolated (07/28/2023)   Social Connection and Isolation Panel [NHANES]    Frequency of Communication with Friends and Family: More than three times a week  Frequency of Social Gatherings with Friends and Family: More than three times a week    Attends Religious Services: 1 to 4 times per year    Active Member of [employer] or Organizations: No    Attends Banker Meetings: Never    Marital Status: Widowed    Tobacco Counseling Counseling given: Not Answered    Clinical Intake:  Pre-visit preparation completed: Yes  Pain : No/denies pain     Nutritional Risks: None Diabetes: No  Lab Results  Component Value Date   HGBA1C 5.3 01/31/2023   HGBA1C 5.3 01/31/2023     How often do you need to have someone help you when you read instructions, pamphlets, or other written materials from your doctor or pharmacy?: 1 - Never  Interpreter Needed?: No  Information entered by :: NAllen LPN   Activities of Daily Living     07/28/2023    1:05 PM 02/01/2023    8:00 AM  In your present state of health, do you have any difficulty performing the following activities:  Hearing? 1 1  Comment has hearing aids   Vision? 0 1  Difficulty concentrating or making decisions? 1 1  Walking or climbing stairs? 1   Dressing or bathing? 0   Doing errands, shopping? 1 0  Preparing Food and eating ? N   Using the Toilet? N   In the past six months, have you accidently leaked urine? N   Do you have problems with loss of bowel control? N    Managing your Medications? Y   Comment daughter in law assists   Managing your Finances? N   Housekeeping or managing your Housekeeping? Y     Patient Care Team: Georganna Skeans, MD as PCP - General (Family Medicine) Drue Second, MD as Consulting Physician (Internal Medicine) Dorothy Puffer, MD as Consulting Physician (Radiation Oncology)  Indicate any recent Medical Services you may have received from other than [facility] providers in the past year (date may be approximate).     Assessment:   This is a routine wellness examination for [place].  Hearing/Vision screen Hearing Screening - Comments:: Has hearing aids Vision Screening - Comments:: Regular eye exams, [facility]   Goals Addressed             This Visit's Progress    Patient Stated       07/28/2023, work on getting up and down       Depression Screen     07/28/2023    1:22 PM 07/04/2023    8:51 AM 03/25/2023    8:49 AM 12/23/2022    9:25 AM 10/12/2022   10:39 AM 07/28/2022    8:58 AM 03/23/2022    8:37 AM  PHQ 2/9 Scores  PHQ - 2 Score 0 0 0 0 0 0 1  PHQ- 9 Score 0  0 0 0 0 2    Fall Risk     07/28/2023    1:17 PM 07/04/2023    8:53 AM 03/25/2023    8:49 AM 07/28/2022    8:58 AM 12/15/2021    2:29 PM  Fall Risk   Falls in the past year? 1 1 0 1 1  Number falls in past yr: 1 0 0 1 1  Injury with Fall? 0 1 0 0 1  Risk for fall due to : Impaired balance/gait;Impaired mobility;Medication side effect;History of fall(s) Other (Comment)  Impaired mobility;Impaired balance/gait Impaired balance/gait;Impaired mobility  Risk for fall due to: Comment  fell by accident     Follow up Falls prevention discussed;Falls evaluation completed Falls evaluation completed;Falls prevention discussed  Falls evaluation completed Falls evaluation completed    MEDICARE RISK AT HOME:  Medicare Risk at Home Any stairs in or around the home?: Yes If so, are there any without handrails?: Yes Home free of loose throw rugs in  walkways, pet beds, electrical cords, etc?: Yes Adequate lighting in your home to reduce risk of falls?: Yes Life alert?: No Use of a cane, walker or w/c?: Yes Grab bars in the bathroom?: No Shower chair or bench in shower?: Yes Elevated toilet seat or a handicapped toilet?: Yes  TIMED UP AND GO:  Was the test performed?  No  Cognitive Function: 6CIT completed        07/28/2023    1:24 PM  6CIT Screen  What Year? 0 points  What month? 0 points  What time? 0 points  Count back from 20 0 points  Months in reverse 4 points  Repeat phrase 4 points  Total Score 8 points    Immunizations Immunization History  Administered Date(s) Administered   Tdap 12/30/2019, 12/03/2021    Screening Tests Health Maintenance  Topic Date Due   COVID-19 Vaccine (1) Never done   INFLUENZA VACCINE  08/01/2023 (Originally 12/02/2022)   Zoster Vaccines- Shingrix (1 of 2) 10/04/2023 (Originally 04/28/1953)   Pneumonia Vaccine 37+ Years old (1 of 1 - PCV) 02/04/2024 (Originally 04/27/2001)   Medicare Annual Wellness (AWV)  07/27/2024   DTaP/Tdap/Td (3 - Td or Tdap) 12/04/2031   DEXA SCAN  Completed   HPV VACCINES  Aged Out    Health Maintenance  Health Maintenance Due  Topic Date Due   COVID-19 Vaccine (1) Never done   Health Maintenance Items Addressed: Declines vaccines  Additional Screening:  Vision Screening: Recommended annual ophthalmology exams for early detection of glaucoma and other disorders of the eye.  Dental Screening: Recommended annual dental exams for proper oral hygiene  Community Resource Referral / Chronic Care Management: CRR required this visit?  No   CCM required this visit?  No     Plan:     I have personally reviewed and noted the following in the patient's chart:   Medical and social history Use of alcohol, tobacco or illicit drugs  Current medications and supplements including opioid prescriptions. Patient is not currently taking opioid  prescriptions. Functional ability and status Nutritional status Physical activity Advanced directives List of other physicians Hospitalizations, surgeries, and ER visits in previous 12 months Vitals Screenings to include cognitive, depression, and falls Referrals and appointments  In addition, I have reviewed and discussed with patient certain preventive protocols, quality metrics, and best practice recommendations. A written personalized care plan for preventive services as well as general preventive health recommendations were provided to patient.     Barb Merino, LPN   08/09/8117   After Visit Summary: (MyChart) Due to this being a telephonic visit, the after visit summary with patients personalized plan was offered to patient via MyChart   Notes: Nothing significant to report at this time.

## 2023-08-02 DIAGNOSIS — R296 Repeated falls: Secondary | ICD-10-CM | POA: Diagnosis not present

## 2023-08-02 DIAGNOSIS — R2681 Unsteadiness on feet: Secondary | ICD-10-CM | POA: Diagnosis not present

## 2023-08-02 DIAGNOSIS — M6281 Muscle weakness (generalized): Secondary | ICD-10-CM | POA: Diagnosis not present

## 2023-08-02 DIAGNOSIS — R293 Abnormal posture: Secondary | ICD-10-CM | POA: Diagnosis not present

## 2023-08-03 ENCOUNTER — Encounter: Payer: Self-pay | Admitting: Family Medicine

## 2023-08-03 ENCOUNTER — Telehealth (INDEPENDENT_AMBULATORY_CARE_PROVIDER_SITE_OTHER): Admitting: Family Medicine

## 2023-08-03 DIAGNOSIS — R2681 Unsteadiness on feet: Secondary | ICD-10-CM

## 2023-08-03 DIAGNOSIS — F329 Major depressive disorder, single episode, unspecified: Secondary | ICD-10-CM | POA: Diagnosis not present

## 2023-08-03 DIAGNOSIS — I10 Essential (primary) hypertension: Secondary | ICD-10-CM | POA: Diagnosis not present

## 2023-08-03 DIAGNOSIS — R634 Abnormal weight loss: Secondary | ICD-10-CM

## 2023-08-03 NOTE — Progress Notes (Signed)
 Virtual Visit via Video Note  I connected with Claudia Stein on 08/03/23 at 11:00 AM EDT by a video enabled telemedicine application and verified that I am speaking with the correct person using two identifiers.  Location: Patient: [place] Provider: [place]   I discussed the limitations of evaluation and management by telemedicine and the availability of in person appointments. The patient expressed understanding and agreed to proceed.  History of Present Illness: Patient for follow up of depression, gait instability. And hypertension. Patient reports doing better with present management and has no acute complaints.    Observations/Objective:   Assessment and Plan: 1. Essential hypertension (Primary) Home redings are appropriate. Continue   2. Gait instability Improving with PT. Continue   3. Reactive depression Doing well with remeron. Continue   4. Loss of weight Weight appears to be steady. Continue    Follow Up Instructions:   follow up in 3-4 months, sooner prn  I discussed the assessment and treatment plan with the patient. The patient was provided an opportunity to ask questions and all were answered. The patient agreed with the plan and demonstrated an understanding of the instructions.   The patient was advised to call back or seek an in-person evaluation if the symptoms worsen or if the condition fails to improve as anticipated.  I provided 12 minutes of non-face-to-face time during this encounter.    Tommie Raymond, MD

## 2023-08-08 ENCOUNTER — Other Ambulatory Visit: Payer: Self-pay | Admitting: Family Medicine

## 2023-08-09 MED ORDER — AMLODIPINE BESYLATE 10 MG PO TABS: 10.0000 mg | ORAL_TABLET | Freq: Every day | ORAL | 0 refills | Status: DC

## 2023-08-10 ENCOUNTER — Telehealth: Payer: Self-pay | Admitting: Family Medicine

## 2023-08-10 DIAGNOSIS — R2681 Unsteadiness on feet: Secondary | ICD-10-CM | POA: Diagnosis not present

## 2023-08-10 DIAGNOSIS — R296 Repeated falls: Secondary | ICD-10-CM | POA: Diagnosis not present

## 2023-08-10 DIAGNOSIS — M6281 Muscle weakness (generalized): Secondary | ICD-10-CM | POA: Diagnosis not present

## 2023-08-10 DIAGNOSIS — R293 Abnormal posture: Secondary | ICD-10-CM | POA: Diagnosis not present

## 2023-08-10 NOTE — Telephone Encounter (Signed)
 A document form has been faxed:  Plan of Care , to be filled out by provider. Send document back via Fax within 5-days. Document is located in providers tray at front office.           Fax number:  [phone]

## 2023-08-11 ENCOUNTER — Other Ambulatory Visit: Payer: Self-pay

## 2023-08-11 ENCOUNTER — Telehealth: Payer: Self-pay | Admitting: Family Medicine

## 2023-08-11 MED ORDER — LEVOTHYROXINE SODIUM 75 MCG PO TABS
75.0000 ug | ORAL_TABLET | Freq: Every day | ORAL | 0 refills | Status: DC
Start: 2023-08-11 — End: 2023-11-03

## 2023-08-11 MED ORDER — VITAMIN B-12 1000 MCG PO TABS
1000.0000 ug | ORAL_TABLET | Freq: Every day | ORAL | 1 refills | Status: AC
  Filled 2023-08-11: qty 90, 90d supply, fill #0

## 2023-08-11 NOTE — Telephone Encounter (Signed)
 See other message

## 2023-08-11 NOTE — Telephone Encounter (Signed)
 A document form has been faxed:  Plan of Care , to be filled out by provider. Send document back via Fax within 5-days. Document is located in providers tray at front office.           Fax number:  [phone]

## 2023-08-11 NOTE — Telephone Encounter (Signed)
Paperwork successfully faxed.

## 2023-08-11 NOTE — Telephone Encounter (Signed)
 Paper work given to provider

## 2023-08-15 ENCOUNTER — Emergency Department (HOSPITAL_COMMUNITY)
Admission: EM | Admit: 2023-08-15 | Discharge: 2023-08-16 | Disposition: A | Discharge status: Home / Self Care | Attending: Emergency Medicine | Admitting: Emergency Medicine

## 2023-08-15 ENCOUNTER — Encounter (HOSPITAL_COMMUNITY): Payer: Self-pay | Admitting: Emergency Medicine

## 2023-08-15 DIAGNOSIS — I10 Essential (primary) hypertension: Secondary | ICD-10-CM | POA: Diagnosis not present

## 2023-08-15 DIAGNOSIS — R197 Diarrhea, unspecified: Secondary | ICD-10-CM | POA: Insufficient documentation

## 2023-08-15 DIAGNOSIS — E860 Dehydration: Secondary | ICD-10-CM | POA: Diagnosis not present

## 2023-08-15 DIAGNOSIS — Z853 Personal history of malignant neoplasm of breast: Secondary | ICD-10-CM | POA: Diagnosis not present

## 2023-08-15 DIAGNOSIS — R8271 Bacteriuria: Secondary | ICD-10-CM | POA: Diagnosis not present

## 2023-08-15 DIAGNOSIS — Z79899 Other long term (current) drug therapy: Secondary | ICD-10-CM | POA: Diagnosis not present

## 2023-08-15 LAB — CBC WITH DIFFERENTIAL/PLATELET
Abs Immature Granulocytes: 0.01 10*3/uL (ref 0.00–0.07)
Basophils Absolute: 0 10*3/uL (ref 0.0–0.1)
Basophils Relative: 0 %
Eosinophils Absolute: 0.2 10*3/uL (ref 0.0–0.5)
Eosinophils Relative: 3 %
HCT: 39.3 % (ref 36.0–46.0)
Hemoglobin: 13.2 g/dL (ref 12.0–15.0)
Immature Granulocytes: 0 %
Lymphocytes Relative: 29 %
Lymphs Abs: 1.8 10*3/uL (ref 0.7–4.0)
MCH: 29.4 pg (ref 26.0–34.0)
MCHC: 33.6 g/dL (ref 30.0–36.0)
MCV: 87.5 fL (ref 80.0–100.0)
Monocytes Absolute: 0.5 10*3/uL (ref 0.1–1.0)
Monocytes Relative: 7 %
Neutro Abs: 3.8 10*3/uL (ref 1.7–7.7)
Neutrophils Relative %: 61 %
Platelets: 216 10*3/uL (ref 150–400)
RBC: 4.49 MIL/uL (ref 3.87–5.11)
RDW: 13.7 % (ref 11.5–15.5)
WBC: 6.2 10*3/uL (ref 4.0–10.5)
nRBC: 0 % (ref 0.0–0.2)

## 2023-08-15 LAB — URINALYSIS, ROUTINE W REFLEX MICROSCOPIC
Bilirubin Urine: NEGATIVE
Glucose, UA: NEGATIVE mg/dL
Hgb urine dipstick: NEGATIVE
Ketones, ur: NEGATIVE mg/dL
Nitrite: NEGATIVE
Protein, ur: NEGATIVE mg/dL
Specific Gravity, Urine: 1.003 — ABNORMAL LOW (ref 1.005–1.030)
pH: 6 (ref 5.0–8.0)

## 2023-08-15 LAB — COMPREHENSIVE METABOLIC PANEL WITH GFR
ALT: 12 U/L (ref 0–44)
AST: 18 U/L (ref 15–41)
Albumin: 3.6 g/dL (ref 3.5–5.0)
Alkaline Phosphatase: 116 U/L (ref 38–126)
Anion gap: 7 (ref 5–15)
BUN: 11 mg/dL (ref 8–23)
CO2: 25 mmol/L (ref 22–32)
Calcium: 9.7 mg/dL (ref 8.9–10.3)
Chloride: 105 mmol/L (ref 98–111)
Creatinine, Ser: 0.81 mg/dL (ref 0.44–1.00)
GFR, Estimated: >60 mL/min (ref >60)
Glucose, Bld: 80 mg/dL (ref 70–99)
Potassium: 3.7 mmol/L (ref 3.5–5.1)
Sodium: 137 mmol/L (ref 135–145)
Total Bilirubin: 0.7 mg/dL (ref 0.0–1.2)
Total Protein: 7.7 g/dL (ref 6.5–8.1)

## 2023-08-15 NOTE — ED Provider Triage Note (Signed)
 Emergency Medicine Provider Triage Evaluation Note  Claudia Stein , a 89 y.o. female  was evaluated in triage.  Pt complains of diarrhea since Friday.  Denies blood in stool.  Denies abdominal pain, nausea or vomiting.  Denies fevers at home.  Denies recent antibiotic use.  Denies history of C. difficile.  Review of Systems  Positive:  Negative:   Physical Exam  There were no vitals taken for this visit. Gen:   Awake, no distress   Resp:  Normal effort  MSK:   Moves extremities without difficulty  Other:    Medical Decision Making  Medically screening exam initiated at 7:31 PM.  Appropriate orders placed.  Claudia Stein was informed that the remainder of the evaluation will be completed by another provider, this initial triage assessment does not replace that evaluation, and the importance of remaining in the ED until their evaluation is complete.     Adel Aden, PA-C 08/15/23 1931

## 2023-08-15 NOTE — ED Triage Notes (Signed)
 Pt BIB EMS from home. Diarrhea x 2 days. Denies any other complaints. No abd pain. No nausea, vomiting, dizziness or fever. PT states everything she eats or drinks goes through her.

## 2023-08-16 MED ORDER — FOSFOMYCIN TROMETHAMINE 3 G PO PACK
3.0000 g | PACK | Freq: Once | ORAL | Status: AC
  Administered 2023-08-16: 3 g via ORAL
  Filled 2023-08-16: qty 3

## 2023-08-16 MED ORDER — LACTATED RINGERS IV BOLUS
1000.0000 mL | Freq: Once | INTRAVENOUS | Status: AC
  Administered 2023-08-16: 1000 mL via INTRAVENOUS

## 2023-08-16 NOTE — Discharge Instructions (Signed)
 Your test results today are reassuring.  Drink plenty of fluids at home to replace your recent fluid losses and to stay hydrated.  Return to the emergency department for any new or worsening symptoms of concern.

## 2023-08-16 NOTE — ED Notes (Signed)
 Pt ambulated 249ft without issues

## 2023-08-16 NOTE — ED Notes (Signed)
 Pt son Berta Brittle is leaving to go home and will return to pick mother up at discharge

## 2023-08-16 NOTE — ED Provider Notes (Addendum)
 [place] EMERGENCY DEPARTMENT AT [facility] Provider Note   CSN: [id number] Arrival date & time: 08/15/23  1909     History  Chief Complaint  Patient presents with   Diarrhea    Claudia Stein is a 89 y.o. female.   Diarrhea Patient presents for diarrhea.  Medical history includes HTN, HLD, remote breast cancer.  Onset of diarrhea was 3 days ago.  2 days ago, she had approximately 20 watery bowel movements.  Yesterday, bowel movements continued in the morning.  She ultimately called EMS in the afternoon.  Since she arrived in the ED, she has had no further episodes of diarrhea.  Patient denies any associated abdominal pain.  She has otherwise felt well.     Home Medications Prior to Admission medications   Medication Sig Start Date End Date Taking? Authorizing Provider  amLODipine (NORVASC) 10 MG tablet Take 1 tablet (10 mg total) by mouth daily. 08/09/23   Georganna Skeans, MD  Calcium Carbonate-Vitamin D (CALCIUM PLUS VITAMIN D PO) Take 1 tablet by mouth daily.      [provider]  carvedilol (COREG) 12.5 MG tablet TAKE 1 TABLET BY MOUTH TWICE DAILY WITH A MEAL 06/03/23   Georganna Skeans, MD  cholecalciferol (VITAMIN D3) 25 MCG (1000 UNIT) tablet Take 1,000 Units by mouth daily.    [provider]  cyanocobalamin (VITAMIN B12) 1000 MCG tablet Take 1 tablet (1,000 mcg total) by mouth daily. 08/11/23   Georganna Skeans, MD  latanoprost (XALATAN) 0.005 % ophthalmic solution Place 1 drop into both eyes at bedtime.  12/10/19   [provider]  levothyroxine (SYNTHROID) 75 MCG tablet Take 1 tablet (75 mcg total) by mouth daily. 08/11/23   Georganna Skeans, MD  mirtazapine (REMERON) 7.5 MG tablet Take 1 tablet (7.5 mg total) by mouth at bedtime. 07/04/23   Georganna Skeans, MD  Multiple Vitamins-Minerals (PRESERVISION AREDS 2 PO) Take 1 tablet by mouth in the morning and at bedtime.     [provider]  pantoprazole (PROTONIX) 40 MG tablet Take 1 tablet (40  mg total) by mouth daily. 05/16/23   Pyrtle, Carie Caddy, MD  simvastatin (ZOCOR) 5 MG tablet TAKE 1 TABLET BY MOUTH AT BEDTIME 05/31/23   Georganna Skeans, MD  vitamin C (ASCORBIC ACID) 500 MG tablet Take 500 mg by mouth daily.      [provider]      Allergies    Haemophilus influenzae vaccines    Review of Systems   Review of Systems  Gastrointestinal:  Positive for diarrhea.  All other systems reviewed and are negative.   Physical Exam Updated Vital Signs BP (!) 189/78   Pulse 68   Temp 97.8 F (36.6 C)   Resp 16   SpO2 100%  Physical Exam Vitals and nursing note reviewed.  Constitutional:      General: She is not in acute distress.    Appearance: Normal appearance. She is well-developed. She is not ill-appearing, toxic-appearing or diaphoretic.  HENT:     Head: Normocephalic and atraumatic.     Right Ear: External ear normal.     Left Ear: External ear normal.     Nose: Nose normal.     Mouth/Throat:     Mouth: Mucous membranes are moist.  Eyes:     Extraocular Movements: Extraocular movements intact.     Conjunctiva/sclera: Conjunctivae normal.  Cardiovascular:     Rate and Rhythm: Normal rate and regular rhythm.  Pulmonary:  Effort: Pulmonary effort is normal. No respiratory distress.  Abdominal:     General: There is no distension.     Palpations: Abdomen is soft.     Tenderness: There is no abdominal tenderness.  Musculoskeletal:        General: No swelling.     Cervical back: Neck supple.  Skin:    General: Skin is warm and dry.     Coloration: Skin is not jaundiced or pale.  Neurological:     General: No focal deficit present.     Mental Status: She is alert and oriented to person, place, and time.  Psychiatric:        Mood and Affect: Mood normal.        Behavior: Behavior normal.     ED Results / Procedures / Treatments   Labs (all labs ordered are listed, but only abnormal results are displayed) Labs Reviewed  URINALYSIS, ROUTINE W  REFLEX MICROSCOPIC - Abnormal; Notable for the following components:      Result Value   APPearance HAZY (*)    Specific Gravity, Urine 1.003 (*)    Leukocytes,Ua LARGE (*)    Bacteria, UA MANY (*)    All other components within normal limits  GASTROINTESTINAL PANEL BY PCR, STOOL (REPLACES STOOL CULTURE)  C DIFFICILE QUICK SCREEN W PCR REFLEX    URINE CULTURE  CBC WITH DIFFERENTIAL/PLATELET  COMPREHENSIVE METABOLIC PANEL WITH GFR    EKG None  Radiology No results found.  Procedures Procedures    Medications Ordered in ED Medications  lactated ringers bolus 1,000 mL (1,000 mLs Intravenous New Bag/Given 08/16/23 0546)  fosfomycin (MONUROL) packet 3 g (3 g Oral Given 08/16/23 0544)    ED Course/ Medical Decision Making/ A&P                                 Medical Decision Making Amount and/or Complexity of Data Reviewed Labs: ordered.  Risk Prescription drug management.   This patient presents to the ED for concern of diarrhea, this involves an extensive number of treatment options, and is a complaint that carries with it a high risk of complications and morbidity.  The differential diagnosis includes gastroenteritis, colitis, absorption, medication side effect   Co morbidities that complicate the patient evaluation  HTN, HLD, remote breast cancer   Additional history obtained:  Additional history obtained from N/A External records from outside source obtained and reviewed including EMR   Lab Tests:  I Ordered, and personally interpreted labs.  The pertinent results include: Normal kidney function, normal electrolytes, no leukocytosis.  Urinalysis shows bacteruria.  Problem List / ED Course / Critical interventions / Medication management  Patient presents for diarrhea for the past 3 days.  She denies any other associated symptoms.  Lab work was initiated prior to patient being bedded in the ED.  Results are reassuring.  She does have presence of bacteria on  urinalysis.  Will send urine culture and treat empirically for UTI.  On exam, patient is well-appearing.  Abdomen is soft and nontender.  At this point, she is 12 hours since her last bowel movement.  Diarrhea has appeared to resolved.  Given her fluid losses, IV fluids were ordered.  Patient remained asymptomatic while in the ED and continued to have no further bowel movements.  She was given food.  Following IV fluids, she was able to ambulate without difficulty.  She was discharged in good  condition. I ordered medication including IV fluids for hydration Reevaluation of the patient after these medicines showed that the patient improved I have reviewed the patients home medicines and have made adjustments as needed  Social Determinants of Health:  Lives independently        Final Clinical Impression(s) / ED Diagnoses Final diagnoses:  Diarrhea, unspecified type  Dehydration    Rx / DC Orders ED Discharge Orders     None         Iva Mariner, MD 08/16/23 8119    Iva Mariner, MD 08/16/23 [id number]

## 2023-08-17 ENCOUNTER — Telehealth: Payer: Self-pay

## 2023-08-17 DIAGNOSIS — R2681 Unsteadiness on feet: Secondary | ICD-10-CM | POA: Diagnosis not present

## 2023-08-17 DIAGNOSIS — M6281 Muscle weakness (generalized): Secondary | ICD-10-CM | POA: Diagnosis not present

## 2023-08-17 DIAGNOSIS — R296 Repeated falls: Secondary | ICD-10-CM | POA: Diagnosis not present

## 2023-08-17 DIAGNOSIS — R293 Abnormal posture: Secondary | ICD-10-CM | POA: Diagnosis not present

## 2023-08-17 LAB — URINE CULTURE

## 2023-08-17 NOTE — Transitions of Care (Post Inpatient/ED Visit) (Unsigned)
   08/17/2023  Name: Claudia Stein MRN: [id number] DOB: 03/09/1934  Today's TOC FU Call Status: Today's TOC FU Call Status:: Unsuccessful Call (1st Attempt) Unsuccessful Call (1st Attempt) Date: 08/17/23  Attempted to reach the patient regarding the most recent Inpatient/ED visit.  Follow Up Plan: Additional outreach attempts will be made to reach the patient to complete the Transitions of Care (Post Inpatient/ED visit) call.   Signature Darrall Ellison, LPN [facility] Nurse Health Advisor Direct Dial [phone]

## 2023-08-18 NOTE — Transitions of Care (Post Inpatient/ED Visit) (Signed)
 08/18/2023  Name: Claudia Stein MRN: [id number] DOB: 1933-08-29  Today's TOC FU Call Status: Today's TOC FU Call Status:: Successful TOC FU Call Completed Unsuccessful Call (1st Attempt) Date: 08/17/23 [facility] FU Call Complete Date: 08/18/23 Patient's Name and Date of Birth confirmed.  Transition Care Management Follow-up Telephone Call Date of Discharge: 08/16/23 Discharge Facility: [facility]) Type of Discharge: Emergency Department Reason for ED Visit: Other: (gastroentertitis) How have you been since you were released from the hospital?: Better Any questions or concerns?: No  Items Reviewed: Did you receive and understand the discharge instructions provided?: Yes Medications obtained,verified, and reconciled?: Yes (Medications Reviewed) Any new allergies since your discharge?: No Dietary orders reviewed?: Yes Do you have support at home?: Yes People in Home [RPT]: child(ren), adult  Medications Reviewed Today: Medications Reviewed Today     Reviewed by Karena Addison, LPN (Licensed Practical Nurse) on 08/18/23 at 1148  Med List Status: <None>   Medication Order Taking? Sig Documenting Provider Last Dose Status Informant  amLODipine (NORVASC) 10 MG tablet [id number]  Take 1 tablet (10 mg total) by mouth daily. Georganna Skeans, MD  Active   Calcium Carbonate-Vitamin D (CALCIUM PLUS VITAMIN D PO) [id number] No Take 1 tablet by mouth daily.   [provider] Taking Active Self  carvedilol (COREG) 12.5 MG tablet [id number] No TAKE 1 TABLET BY MOUTH TWICE DAILY WITH A MEAL Georganna Skeans, MD Taking Active   cholecalciferol (VITAMIN D3) 25 MCG (1000 UNIT) tablet [id number] No Take 1,000 Units by mouth daily. [provider] Taking Active Self  cyanocobalamin (VITAMIN B12) 1000 MCG tablet [id number]  Take 1 tablet (1,000 mcg total) by mouth daily. Georganna Skeans, MD  Active   latanoprost (XALATAN) 0.005 % ophthalmic solution [id number] No Place 1 drop into both eyes at  bedtime.  [provider] Taking Active Self  levothyroxine (SYNTHROID) 75 MCG tablet [id number]  Take 1 tablet (75 mcg total) by mouth daily. Georganna Skeans, MD  Active   mirtazapine (REMERON) 7.5 MG tablet [id number] No Take 1 tablet (7.5 mg total) by mouth at bedtime. Georganna Skeans, MD Taking Active   Multiple Vitamins-Minerals (PRESERVISION AREDS 2 PO) [id number] No Take 1 tablet by mouth in the morning and at bedtime.  [provider] Taking Active Self  pantoprazole (PROTONIX) 40 MG tablet [id number] No Take 1 tablet (40 mg total) by mouth daily. Beverley Fiedler, MD Taking Active   simvastatin (ZOCOR) 5 MG tablet [id number] No TAKE 1 TABLET BY MOUTH AT BEDTIME Georganna Skeans, MD Taking Active   vitamin C (ASCORBIC ACID) 500 MG tablet [id number] No Take 500 mg by mouth daily.   [provider] Taking Active Self            Home Care and Equipment/Supplies: Were Home Health Services Ordered?: NA Any new equipment or medical supplies ordered?: NA  Functional Questionnaire: Do you need assistance with bathing/showering or dressing?: No Do you need assistance with meal preparation?: No Do you need assistance with eating?: No Do you have difficulty maintaining continence: No Do you need assistance with getting out of bed/getting out of a chair/moving?: No Do you have difficulty managing or taking your medications?: No  Follow up appointments reviewed: PCP Follow-up appointment confirmed?: No (declined) Specialist Hospital Follow-up appointment confirmed?: NA Do you need transportation to your follow-up appointment?: No Do you understand care options if your condition(s) worsen?: Yes-patient verbalized understanding    SIGNATURE Karena Addison, LPN [facility] Nurse Health Advisor Direct Dial [phone]

## 2023-08-23 ENCOUNTER — Other Ambulatory Visit: Payer: Self-pay

## 2023-08-24 ENCOUNTER — Other Ambulatory Visit: Payer: Self-pay | Admitting: Family Medicine

## 2023-08-30 ENCOUNTER — Other Ambulatory Visit: Payer: Self-pay | Admitting: Family Medicine

## 2023-09-20 ENCOUNTER — Other Ambulatory Visit: Payer: Self-pay | Admitting: Family Medicine

## 2023-09-28 DIAGNOSIS — H401131 Primary open-angle glaucoma, bilateral, mild stage: Secondary | ICD-10-CM | POA: Diagnosis not present

## 2023-10-10 ENCOUNTER — Encounter: Payer: Self-pay | Admitting: Family Medicine

## 2023-10-11 ENCOUNTER — Ambulatory Visit: Payer: Self-pay

## 2023-10-11 NOTE — Telephone Encounter (Signed)
 FYI Only or Action Required?: Action required by provider  Patient was last seen in primary care on 08/03/2023 by Abraham Abo, MD. Called Nurse Triage reporting Dizziness. Symptoms began after beginning Mirtazapine . Interventions attempted: Nothing. Symptoms are: gradually worsening.  Triage Disposition: See Physician Within 24 Hours  Patient/caregiver understands and will follow disposition?: No, wishes to speak with PCP            Copied from CRM [id number]. Topic: Clinical - Red Word Triage >> Oct 11, 2023 10:42 AM Antwanette L wrote: Red Word that prompted transfer to Nurse Triage:  Patient is experiencing confusion and dizziness. Patient is currently taking mirtazapine  (REMERON ) 7.5 MG tablet Reason for Disposition  [1] MODERATE dizziness (e.g., interferes with normal activities) AND [2] has NOT been evaluated by doctor (or NP/PA) for this  (Exception: Dizziness caused by heat exposure, sudden standing, or poor fluid intake.)  Answer Assessment - Initial Assessment Questions 1. DESCRIPTION: "Describe your dizziness."     Lightheaded 4. SEVERITY: "How bad is it?"  "Do you feel like you are going to faint?" "Can you stand and walk?"   - MILD: Feels slightly dizzy, but walking normally.   - MODERATE: Feels unsteady when walking, but not falling; interferes with normal activities (e.g., school, work).   - SEVERE: Unable to walk without falling, or requires assistance to walk without falling; feels like passing out now.      Per DIL "only happened a couple times" 5. ONSET:  "When did the dizziness begin?"     Shortly after beginning Mirtazapine   8. CAUSE: "What do you think is causing the dizziness?"     Unknown, family believes medication SE  10. OTHER SYMPTOMS: "Do you have any other symptoms?" (e.g., fever, chest pain, vomiting, diarrhea, bleeding)       None    Pt's DIL expressed growing concern as pt has become increasingly confused/forgetful and notes this began  shortly after beginning Mirtazapine . She also reports pt has had a "few episodes" of dizziness that resolved with rest. Offered OV, declined, requests PCP ONLY  Protocols used: Dizziness - Lightheadedness-A-AH

## 2023-10-11 NOTE — Telephone Encounter (Signed)
 Cal;ling pt to schedule appt

## 2023-10-11 NOTE — Telephone Encounter (Signed)
 Patient has upcoming appt  10/11/2023

## 2023-10-12 ENCOUNTER — Encounter: Payer: Self-pay | Admitting: Physician Assistant

## 2023-10-12 ENCOUNTER — Ambulatory Visit (INDEPENDENT_AMBULATORY_CARE_PROVIDER_SITE_OTHER): Admitting: Physician Assistant

## 2023-10-12 VITALS — BP 164/74 | HR 66 | Wt 111.2 lb

## 2023-10-12 DIAGNOSIS — T887XXA Unspecified adverse effect of drug or medicament, initial encounter: Secondary | ICD-10-CM | POA: Diagnosis not present

## 2023-10-12 MED ORDER — SERTRALINE HCL 25 MG PO TABS
25.0000 mg | ORAL_TABLET | Freq: Every day | ORAL | 3 refills | Status: DC
Start: 2023-10-12 — End: 2023-11-09

## 2023-10-12 NOTE — Patient Instructions (Addendum)
 Take 1/2 tablet daily for a week.  Then increase to a whole tablet daily.  Drink 64 ounces water daily.   STOP MIrtazapine !!

## 2023-10-12 NOTE — Progress Notes (Signed)
 Patient ID: Claudia Stein, female   DOB: [birth year], 89 y.o.   MRN: [id number]   Claudia Stein, is a 89 y.o. female  [id number]  [id number]  DOB - Jul 18, 1933  Chief Complaint  Patient presents with   Medication Management    Mirtazaopine    Dizziness    To medication        Subjective:   Claudia Stein is a 89 y.o. female here today for a concern with medication.  Her son is with her.  Since starting mirtazapine , she has had some trouble with her memory at night.  She goes to bed about 7:30pm and has been waking up about 9:30pm and calling her son to find out if it is morning or night.  She was initially started on the medication to help with worrying.  She also feels like it makes her dizzy after she takes it but did not mention any of this to Dr Elvan Hamel on her recent video visit.  She worries about her grandson a lot and needs help with that.  She is open to trying a different medication.  She admits to not drinking enough water.    No problems updated.  ALLERGIES: Allergies  Allergen Reactions   Haemophilus Influenzae Vaccines Nausea And Vomiting    PAST MEDICAL HISTORY: Past Medical History:  Diagnosis Date   Bruises easily    Cancer (HCC) 04-16-09, 2012   Breast- bilateral   Hyperlipidemia    Hypertension    PONV (postoperative nausea and vomiting)    Thyroid  disease     MEDICATIONS AT HOME: Prior to Admission medications   Medication Sig Start Date End Date Taking? Authorizing Provider  amLODipine  (NORVASC ) 10 MG tablet Take 1 tablet (10 mg total) by mouth daily. 08/09/23  Yes Abraham Abo, MD  Calcium  Carbonate-Vitamin D  (CALCIUM  PLUS VITAMIN D  PO) Take 1 tablet by mouth daily.     Yes [provider]  carvedilol  (COREG ) 12.5 MG tablet TAKE 1 TABLET BY MOUTH TWICE DAILY WITH A MEAL 08/30/23  Yes Abraham Abo, MD  cholecalciferol  (VITAMIN D3) 25 MCG (1000 UNIT) tablet Take 1,000 Units by mouth daily.   Yes [provider]  cyanocobalamin  (VITAMIN  B12) 1000 MCG tablet Take 1 tablet (1,000 mcg total) by mouth daily. 08/11/23  Yes Abraham Abo, MD  latanoprost  (XALATAN ) 0.005 % ophthalmic solution Place 1 drop into both eyes at bedtime.  12/10/19  Yes [provider]  levothyroxine  (SYNTHROID ) 75 MCG tablet Take 1 tablet (75 mcg total) by mouth daily. 08/11/23  Yes Abraham Abo, MD  Multiple Vitamins-Minerals (PRESERVISION AREDS 2 PO) Take 1 tablet by mouth in the morning and at bedtime.    Yes [provider]  pantoprazole  (PROTONIX ) 40 MG tablet Take 1 tablet (40 mg total) by mouth daily. 05/16/23  Yes Pyrtle, Amber Bail, MD  sertraline (ZOLOFT) 25 MG tablet Take 1 tablet (25 mg total) by mouth daily. 10/12/23  Yes Tyden Kann, Shelvy Dickens M, PA-C  simvastatin  (ZOCOR ) 5 MG tablet TAKE 1 TABLET BY MOUTH AT BEDTIME 08/24/23  Yes Abraham Abo, MD  vitamin C (ASCORBIC ACID ) 500 MG tablet Take 500 mg by mouth daily.     Yes [provider]    ROS: Neg HEENT Neg resp Neg cardiac Neg GI Neg GU Neg MS  Objective:   Vitals:   10/12/23 1542  BP: (!) 164/74  Pulse: 66  SpO2: 96%  Weight: 111 lb 3.2 oz (50.4 kg)   Exam General appearance : Awake,  alert, not in any distress. Speech Clear. Not toxic looking HEENT: Atraumatic and Normocephalic Neck: Supple, no JVD. No cervical lymphadenopathy.  Chest: Good air entry bilaterally, CTAB.  No rales/rhonchi/wheezing CVS: S1 S2 regular, no murmurs.  Extremities: B/L Lower Ext shows no edema, both legs are warm to touch Neurology: Awake alert, and oriented X 3, CN II-XII intact, Non focal Skin: No Rash  Data Review Lab Results  Component Value Date   HGBA1C 5.3 01/31/2023   HGBA1C 5.3 01/31/2023    Assessment & Plan   1. Side effect of medication (Primary) Start sertraline-Take 1/2 tablet daily for a week.  Then increase to a whole tablet daily.  Drink 64 ounces water daily.   STOP MIrtazapine !!     Return in about 4 weeks (around 11/09/2023) for PCP for chronic  conditions-recheck medication cahnge.  The patient was given clear instructions to go to ER or return to medical center if symptoms don't improve, worsen or new problems develop. The patient verbalized understanding. The patient was told to call to get lab results if they haven't heard anything in the next week.      Claudia Gibbs, PA-C [facility] [place], [place] [phone]   10/12/2023, 4:49 PM

## 2023-10-13 NOTE — Telephone Encounter (Signed)
 Patient came in for appt.

## 2023-10-22 ENCOUNTER — Other Ambulatory Visit: Payer: Self-pay | Admitting: Family Medicine

## 2023-11-03 ENCOUNTER — Other Ambulatory Visit: Payer: Self-pay | Admitting: Family Medicine

## 2023-11-07 ENCOUNTER — Other Ambulatory Visit: Payer: Self-pay | Admitting: Internal Medicine

## 2023-11-08 ENCOUNTER — Other Ambulatory Visit: Payer: Self-pay | Admitting: Family Medicine

## 2023-11-09 ENCOUNTER — Encounter: Payer: Self-pay | Admitting: Family Medicine

## 2023-11-09 ENCOUNTER — Ambulatory Visit (INDEPENDENT_AMBULATORY_CARE_PROVIDER_SITE_OTHER): Admitting: Family Medicine

## 2023-11-09 VITALS — BP 133/52 | HR 52 | Ht <= 58 in | Wt 107.0 lb

## 2023-11-09 DIAGNOSIS — I10 Essential (primary) hypertension: Secondary | ICD-10-CM | POA: Diagnosis not present

## 2023-11-09 DIAGNOSIS — E785 Hyperlipidemia, unspecified: Secondary | ICD-10-CM | POA: Diagnosis not present

## 2023-11-09 DIAGNOSIS — E039 Hypothyroidism, unspecified: Secondary | ICD-10-CM

## 2023-11-09 DIAGNOSIS — R634 Abnormal weight loss: Secondary | ICD-10-CM

## 2023-11-09 DIAGNOSIS — F329 Major depressive disorder, single episode, unspecified: Secondary | ICD-10-CM

## 2023-11-09 MED ORDER — LEVOTHYROXINE SODIUM 75 MCG PO TABS: 75.0000 ug | ORAL_TABLET | Freq: Every day | ORAL | 1 refills | Status: AC

## 2023-11-09 MED ORDER — CARVEDILOL 12.5 MG PO TABS: 12.5000 mg | ORAL_TABLET | Freq: Two times a day (BID) | ORAL | 1 refills | Status: DC

## 2023-11-09 MED ORDER — AMLODIPINE BESYLATE 10 MG PO TABS: 10.0000 mg | ORAL_TABLET | Freq: Every day | ORAL | 1 refills | Status: DC

## 2023-11-09 MED ORDER — SIMVASTATIN 5 MG PO TABS: 5.0000 mg | ORAL_TABLET | Freq: Every day | ORAL | 1 refills | Status: DC

## 2023-11-09 MED ORDER — SERTRALINE HCL 25 MG PO TABS: 25.0000 mg | ORAL_TABLET | Freq: Every day | ORAL | 1 refills | Status: AC

## 2023-11-09 NOTE — Progress Notes (Signed)
 Established Patient Office Visit  Subjective    Patient ID: Claudia Stein, female    DOB: 11/12/1933  Age: 89 y.o. MRN: [id number]  CC:  Chief Complaint  Patient presents with   Medical Management of Chronic Issues    BP still elevated    HPI Claudia Stein presents for follow up of chronic med issues including hypertension, hypothyroidism, depression, and hyperlipidemia.They prefer the zoloft  over the remeron ..    Outpatient Encounter Medications as of 11/09/2023  Medication Sig   Calcium  Carbonate-Vitamin D  (CALCIUM  PLUS VITAMIN D  PO) Take 1 tablet by mouth daily.     cholecalciferol  (VITAMIN D3) 25 MCG (1000 UNIT) tablet Take 1,000 Units by mouth daily.   cyanocobalamin  (VITAMIN B12) 1000 MCG tablet Take 1 tablet (1,000 mcg total) by mouth daily.   latanoprost  (XALATAN ) 0.005 % ophthalmic solution Place 1 drop into both eyes at bedtime.    mirtazapine  (REMERON ) 7.5 MG tablet TAKE 1 TABLET BY MOUTH AT BEDTIME   Multiple Vitamins-Minerals (PRESERVISION AREDS 2 PO) Take 1 tablet by mouth in the morning and at bedtime.    pantoprazole  (PROTONIX ) 40 MG tablet Take 1 tablet by mouth once daily   vitamin C (ASCORBIC ACID ) 500 MG tablet Take 500 mg by mouth daily.     [DISCONTINUED] amLODipine  (NORVASC ) 10 MG tablet Take 1 tablet (10 mg total) by mouth daily.   [DISCONTINUED] carvedilol  (COREG ) 12.5 MG tablet TAKE 1 TABLET BY MOUTH TWICE DAILY WITH A MEAL   [DISCONTINUED] levothyroxine  (SYNTHROID ) 75 MCG tablet Take 1 tablet by mouth once daily   [DISCONTINUED] sertraline  (ZOLOFT ) 25 MG tablet Take 1 tablet (25 mg total) by mouth daily.   [DISCONTINUED] simvastatin  (ZOCOR ) 5 MG tablet TAKE 1 TABLET BY MOUTH AT BEDTIME   amLODipine  (NORVASC ) 10 MG tablet Take 1 tablet (10 mg total) by mouth daily.   carvedilol  (COREG ) 12.5 MG tablet Take 1 tablet (12.5 mg total) by mouth 2 (two) times daily with a meal.   levothyroxine  (SYNTHROID ) 75 MCG tablet Take 1 tablet (75 mcg total) by mouth daily.    sertraline  (ZOLOFT ) 25 MG tablet Take 1 tablet (25 mg total) by mouth daily.   simvastatin  (ZOCOR ) 5 MG tablet Take 1 tablet (5 mg total) by mouth at bedtime.   No facility-administered encounter medications on file as of 11/09/2023.    Past Medical History:  Diagnosis Date   Bruises easily    Cancer (HCC) 04-16-09, 2012   Breast- bilateral   Hyperlipidemia    Hypertension    PONV (postoperative nausea and vomiting)    Thyroid  disease     Past Surgical History:  Procedure Laterality Date   APPENDECTOMY     BREAST SURGERY  04-16-09   left  Lumpectomy   ESOPHAGOGASTRODUODENOSCOPY (EGD) WITH PROPOFOL  N/A 01/31/2023   Procedure: ESOPHAGOGASTRODUODENOSCOPY (EGD) WITH PROPOFOL ;  Surgeon: Albertus Gordy HERO, MD;  Location: [facility] ENDOSCOPY;  Service: Gastroenterology;  Laterality: N/A;   FOREIGN BODY REMOVAL  01/31/2023   Procedure: FOREIGN BODY REMOVAL;  Surgeon: Albertus Gordy HERO, MD;  Location: [facility] ENDOSCOPY;  Service: Gastroenterology;;   FRACTURE SURGERY     broke right ankle in the 80's   MASTECTOMY  November 2011   right breast   MASTECTOMY W/ SENTINEL NODE BIOPSY  03/22/2011   Procedure: MASTECTOMY WITH SENTINEL LYMPH NODE BIOPSY;  Surgeon: Alm VEAR Angle, MD;  Location: [facility] OR;  Service: General;  Laterality: Left;  left mastectomy, sential lymph node biopsy, left axilla  Family History  Problem Relation Age of Onset   Heart disease Mother 14   Cancer Paternal Aunt        colon   Colon cancer Neg Hx    Esophageal cancer Neg Hx    Rectal cancer Neg Hx    Stomach cancer Neg Hx     Social History   Socioeconomic History   Marital status: Widowed    Spouse name: Not on file   Number of children: Not on file   Years of education: Not on file   Highest education level: Not on file  Occupational History   Not on file  Tobacco Use   Smoking status: Never    Passive exposure: Never   Smokeless tobacco: Never  Vaping Use   Vaping status: Never Used  Substance and Sexual Activity    Alcohol use: No   Drug use: No   Sexual activity: Not Currently  Other Topics Concern   Not on file  Social History Narrative   Not on file   Social Drivers of Health   Financial Resource Strain: Low Risk  (07/28/2023)   Overall Financial Resource Strain (CARDIA)    Difficulty of Paying Living Expenses: Not hard at all  Food Insecurity: No Food Insecurity (07/28/2023)   Hunger Vital Sign    Worried About Running Out of Food in the Last Year: Never true    Ran Out of Food in the Last Year: Never true  Transportation Needs: No Transportation Needs (07/28/2023)   PRAPARE - Administrator, Civil Service (Medical): No    Lack of Transportation (Non-Medical): No  Physical Activity: Sufficiently Active (07/28/2023)   Exercise Vital Sign    Days of Exercise per Week: 7 days    Minutes of Exercise per Session: 60 min  Stress: No Stress Concern Present (07/28/2023)   [employer] of Occupational Health - Occupational Stress Questionnaire    Feeling of Stress : Not at all  Recent Concern: Stress - Stress Concern Present (07/04/2023)   [employer] of Occupational Health - Occupational Stress Questionnaire    Feeling of Stress : Very much  Social Connections: Moderately Isolated (07/28/2023)   Social Connection and Isolation Panel    Frequency of Communication with Friends and Family: More than three times a week    Frequency of Social Gatherings with Friends and Family: More than three times a week    Attends Religious Services: 1 to 4 times per year    Active Member of [employer] or Organizations: No    Attends Banker Meetings: Never    Marital Status: Widowed  Intimate Partner Violence: Not At Risk (07/28/2023)   Humiliation, Afraid, Rape, and Kick questionnaire    Fear of Current or Ex-Partner: No    Emotionally Abused: No    Physically Abused: No    Sexually Abused: No    Review of Systems  All other systems reviewed and are negative.        Objective    BP (!) 133/52   Pulse (!) 52   Ht 4' 9 (1.448 m)   Wt 107 lb (48.5 kg)   SpO2 95%   BMI 23.15 kg/m   Physical Exam Vitals and nursing note reviewed.  Constitutional:      General: She is not in acute distress. HENT:     Head: Normocephalic.  Cardiovascular:     Rate and Rhythm: Normal rate and regular rhythm.  Pulmonary:     Effort:  Pulmonary effort is normal.     Breath sounds: Normal breath sounds.  Abdominal:     Palpations: Abdomen is soft.     Tenderness: There is no abdominal tenderness.  Musculoskeletal:        General: Deformity (kyphosis) present.  Neurological:     General: No focal deficit present.     Mental Status: She is alert and oriented to person, place, and time.         Assessment & Plan:   1. Essential hypertension (Primary) Appears stable. Meds refilled. Continue   2. Loss of weight Loss of 4 lbs. Patient is eating well. Will monitor  3. Hypothyroidism, unspecified type Appears stable. Continue   4. Hyperlipidemia, unspecified hyperlipidemia type Continue   5. Reactive depression Doing well on zoloft . Continue     Return in about 2 months (around 01/10/2024) for follow up.   Tanda Raguel SQUIBB, MD

## 2023-12-26 ENCOUNTER — Ambulatory Visit: Admitting: Family Medicine

## 2024-01-10 ENCOUNTER — Ambulatory Visit: Admitting: Family Medicine

## 2024-02-04 ENCOUNTER — Other Ambulatory Visit: Payer: Self-pay | Admitting: Internal Medicine

## 2024-02-20 ENCOUNTER — Encounter: Payer: Self-pay | Admitting: Family Medicine

## 2024-02-20 ENCOUNTER — Ambulatory Visit (INDEPENDENT_AMBULATORY_CARE_PROVIDER_SITE_OTHER): Admitting: Family Medicine

## 2024-02-20 VITALS — BP 154/73 | HR 57 | Ht <= 58 in | Wt 110.0 lb

## 2024-02-20 DIAGNOSIS — I10 Essential (primary) hypertension: Secondary | ICD-10-CM

## 2024-02-20 DIAGNOSIS — F329 Major depressive disorder, single episode, unspecified: Secondary | ICD-10-CM | POA: Diagnosis not present

## 2024-02-20 DIAGNOSIS — R2681 Unsteadiness on feet: Secondary | ICD-10-CM

## 2024-02-20 DIAGNOSIS — R413 Other amnesia: Secondary | ICD-10-CM | POA: Diagnosis not present

## 2024-02-20 NOTE — Progress Notes (Unsigned)
 Established Patient Office Visit  Subjective    Patient ID: Claudia Stein, female    DOB: Feb 10, 1934  Age: 89 y.o. MRN: [id number]  CC:  Chief Complaint  Patient presents with   Medical Management of Chronic Issues    Daughter in law reports her memory has gotten really bad     HPI Claudia Stein presents with daughter-in-law for follow up of hypertension. They report that patient has had a significant change in her memory over the past several weeks since last visit. They are concerned that she may not be able to live by herself any longer. They are also concerned that she may have something changing acutely.   Outpatient Encounter Medications as of 02/20/2024  Medication Sig   amLODipine  (NORVASC ) 10 MG tablet Take 1 tablet (10 mg total) by mouth daily.   Calcium  Carbonate-Vitamin D  (CALCIUM  PLUS VITAMIN D  PO) Take 1 tablet by mouth daily.     carvedilol  (COREG ) 12.5 MG tablet Take 1 tablet (12.5 mg total) by mouth 2 (two) times daily with a meal.   cholecalciferol  (VITAMIN D3) 25 MCG (1000 UNIT) tablet Take 1,000 Units by mouth daily.   cyanocobalamin  (VITAMIN B12) 1000 MCG tablet Take 1 tablet (1,000 mcg total) by mouth daily.   levothyroxine  (SYNTHROID ) 75 MCG tablet Take 1 tablet (75 mcg total) by mouth daily.   Multiple Vitamins-Minerals (PRESERVISION AREDS 2 PO) Take 1 tablet by mouth in the morning and at bedtime.    sertraline  (ZOLOFT ) 25 MG tablet Take 1 tablet (25 mg total) by mouth daily.   simvastatin  (ZOCOR ) 5 MG tablet Take 1 tablet (5 mg total) by mouth at bedtime.   vitamin C (ASCORBIC ACID ) 500 MG tablet Take 500 mg by mouth daily.     latanoprost  (XALATAN ) 0.005 % ophthalmic solution Place 1 drop into both eyes at bedtime.    mirtazapine  (REMERON ) 7.5 MG tablet TAKE 1 TABLET BY MOUTH AT BEDTIME (Patient not taking: Reported on 02/20/2024)   pantoprazole  (PROTONIX ) 40 MG tablet Take 1 tablet (40 mg total) by mouth daily. Please schedule a yearly follow up for further  refills. Thank you (Patient not taking: Reported on 02/20/2024)   No facility-administered encounter medications on file as of 02/20/2024.    Past Medical History:  Diagnosis Date   Bruises easily    Cancer (HCC) 04-16-09, 2012   Breast- bilateral   Hyperlipidemia    Hypertension    PONV (postoperative nausea and vomiting)    Thyroid  disease     Past Surgical History:  Procedure Laterality Date   APPENDECTOMY     BREAST SURGERY  04-16-09   left  Lumpectomy   ESOPHAGOGASTRODUODENOSCOPY (EGD) WITH PROPOFOL  N/A 01/31/2023   Procedure: ESOPHAGOGASTRODUODENOSCOPY (EGD) WITH PROPOFOL ;  Surgeon: Albertus Gordy HERO, MD;  Location: [facility] ENDOSCOPY;  Service: Gastroenterology;  Laterality: N/A;   FOREIGN BODY REMOVAL  01/31/2023   Procedure: FOREIGN BODY REMOVAL;  Surgeon: Albertus Gordy HERO, MD;  Location: [facility] ENDOSCOPY;  Service: Gastroenterology;;   FRACTURE SURGERY     broke right ankle in the 80's   MASTECTOMY  November 2011   right breast   MASTECTOMY W/ SENTINEL NODE BIOPSY  03/22/2011   Procedure: MASTECTOMY WITH SENTINEL LYMPH NODE BIOPSY;  Surgeon: Alm VEAR Angle, MD;  Location: [facility] OR;  Service: General;  Laterality: Left;  left mastectomy, sential lymph node biopsy, left axilla    Family History  Problem Relation Age of Onset   Heart disease Mother 65  Cancer Paternal Aunt        colon   Colon cancer Neg Hx    Esophageal cancer Neg Hx    Rectal cancer Neg Hx    Stomach cancer Neg Hx     Social History   Socioeconomic History   Marital status: Widowed    Spouse name: Not on file   Number of children: Not on file   Years of education: Not on file   Highest education level: Not on file  Occupational History   Not on file  Tobacco Use   Smoking status: Never    Passive exposure: Never   Smokeless tobacco: Never  Vaping Use   Vaping status: Never Used  Substance and Sexual Activity   Alcohol use: No   Drug use: No   Sexual activity: Not Currently  Other Topics Concern    Not on file  Social History Narrative   Not on file   Social Drivers of Health   Financial Resource Strain: Low Risk  (07/28/2023)   Overall Financial Resource Strain (CARDIA)    Difficulty of Paying Living Expenses: Not hard at all  Food Insecurity: No Food Insecurity (07/28/2023)   Hunger Vital Sign    Worried About Running Out of Food in the Last Year: Never true    Ran Out of Food in the Last Year: Never true  Transportation Needs: No Transportation Needs (07/28/2023)   PRAPARE - Administrator, Civil Service (Medical): No    Lack of Transportation (Non-Medical): No  Physical Activity: Sufficiently Active (07/28/2023)   Exercise Vital Sign    Days of Exercise per Week: 7 days    Minutes of Exercise per Session: 60 min  Stress: No Stress Concern Present (07/28/2023)   [employer] of Occupational Health - Occupational Stress Questionnaire    Feeling of Stress : Not at all  Recent Concern: Stress - Stress Concern Present (07/04/2023)   [employer] of Occupational Health - Occupational Stress Questionnaire    Feeling of Stress : Very much  Social Connections: Moderately Isolated (07/28/2023)   Social Connection and Isolation Panel    Frequency of Communication with Friends and Family: More than three times a week    Frequency of Social Gatherings with Friends and Family: More than three times a week    Attends Religious Services: 1 to 4 times per year    Active Member of [employer] or Organizations: No    Attends Banker Meetings: Never    Marital Status: Widowed  Intimate Partner Violence: Not At Risk (07/28/2023)   Humiliation, Afraid, Rape, and Kick questionnaire    Fear of Current or Ex-Partner: No    Emotionally Abused: No    Physically Abused: No    Sexually Abused: No    Review of Systems  All other systems reviewed and are negative.       Objective    BP (!) 154/73   Pulse (!) 57   Ht 4' 9 (1.448 m)   Wt 110 lb (49.9 kg)   SpO2  96%   BMI 23.80 kg/m   Physical Exam Vitals and nursing note reviewed.  Constitutional:      General: She is not in acute distress. HENT:     Head: Normocephalic.  Cardiovascular:     Rate and Rhythm: Normal rate and regular rhythm.  Pulmonary:     Effort: Pulmonary effort is normal.     Breath sounds: Normal breath sounds.  Abdominal:  Palpations: Abdomen is soft.     Tenderness: There is no abdominal tenderness.  Musculoskeletal:        General: Deformity (kyphosis) present.  Neurological:     General: No focal deficit present.     Mental Status: She is alert and oriented to person, place, and time.  Psychiatric:        Mood and Affect: Mood and affect normal.        Speech: Speech normal.        Behavior: Behavior normal. Behavior is cooperative.        Judgment: Judgment normal.         Assessment & Plan:   1. Essential hypertension (Primary) Continue   2. Reactive depression Continue present management  3. Gait instability Appears stable. Utilizing cane. Discussed utilizing walker to prevent falls  4. Memory deficit Referral to consultant for further eval/mgt - Ambulatory referral to Neurology    No follow-ups on file.   Tanda Raguel SQUIBB, MD

## 2024-04-02 ENCOUNTER — Ambulatory Visit: Admitting: Family Medicine

## 2024-05-06 ENCOUNTER — Other Ambulatory Visit: Payer: Self-pay | Admitting: Internal Medicine

## 2024-05-08 ENCOUNTER — Other Ambulatory Visit: Payer: Self-pay | Admitting: Family Medicine

## 2024-05-15 ENCOUNTER — Other Ambulatory Visit: Payer: Self-pay | Admitting: Family Medicine

## 2024-05-24 ENCOUNTER — Other Ambulatory Visit: Payer: Self-pay | Admitting: Family Medicine

## 2024-05-24 NOTE — Telephone Encounter (Signed)
 Requested Prescriptions  Pending Prescriptions Disp Refills   carvedilol  (COREG ) 12.5 MG tablet [Pharmacy Med Name: Carvedilol  12.5 MG Oral Tablet] 180 tablet 0    Sig: TAKE 1 TABLET BY MOUTH TWICE DAILY WITH A MEAL     Cardiovascular: Beta Blockers 3 Failed - 05/24/2024 12:53 PM      Failed - Last BP in normal range    BP Readings from Last 1 Encounters:  02/20/24 (!) 154/73         Passed - Cr in normal range and within 360 days    Creatinine  Date Value Ref Range Status  02/12/2014 1.0 0.6 - 1.1 mg/dL Final   Creatinine, Ser  Date Value Ref Range Status  08/15/2023 0.81 0.44 - 1.00 mg/dL Final         Passed - AST in normal range and within 360 days    AST  Date Value Ref Range Status  08/15/2023 18 15 - 41 U/L Final  02/12/2014 16 5 - 34 U/L Final         Passed - ALT in normal range and within 360 days    ALT  Date Value Ref Range Status  08/15/2023 12 0 - 44 U/L Final  02/12/2014 17 0 - 55 U/L Final         Passed - Last Heart Rate in normal range    Pulse Readings from Last 1 Encounters:  02/20/24 (!) 57         Passed - Valid encounter within last 6 months    Recent Outpatient Visits           3 months ago Essential hypertension   [place] [facility], MD   6 months ago Essential hypertension   [place] [facility], [place]   7 months ago Side effect of medication   [facility], Jon HERO, PA-C   9 months ago Essential hypertension   [place] [facility], MD   10 months ago Essential hypertension   [place] [facility], MD

## 2024-06-04 ENCOUNTER — Other Ambulatory Visit: Payer: Self-pay | Admitting: Gastroenterology

## 2024-06-26 ENCOUNTER — Ambulatory Visit: Admitting: Neurology

## 2024-07-19 ENCOUNTER — Ambulatory Visit: Payer: Self-pay | Admitting: Family Medicine

## 2024-08-09 ENCOUNTER — Ambulatory Visit
